# Patient Record
Sex: Male | Born: 1945 | Race: Black or African American | Hispanic: No | Marital: Single | Smoking: Current every day smoker
Health system: Southern US, Community
[De-identification: ages and names within clinical notes are randomized; demographics above are authoritative.]

## PROBLEM LIST (undated history)

## (undated) DIAGNOSIS — R52 Pain, unspecified: Secondary | ICD-10-CM

## (undated) DIAGNOSIS — M1990 Unspecified osteoarthritis, unspecified site: Secondary | ICD-10-CM

## (undated) DIAGNOSIS — R569 Unspecified convulsions: Secondary | ICD-10-CM

## (undated) HISTORY — PX: HAND SURGERY: SHX662

---

## 1998-08-19 DIAGNOSIS — R569 Unspecified convulsions: Secondary | ICD-10-CM

## 1998-08-19 HISTORY — DX: Unspecified convulsions: R56.9

## 2005-03-19 ENCOUNTER — Other Ambulatory Visit: Payer: Self-pay

## 2005-03-19 ENCOUNTER — Emergency Department: Payer: Self-pay | Admitting: Emergency Medicine

## 2008-08-19 HISTORY — PX: JOINT REPLACEMENT: SHX530

## 2014-02-18 ENCOUNTER — Emergency Department (HOSPITAL_COMMUNITY): Payer: Medicare Other

## 2014-02-18 ENCOUNTER — Emergency Department (HOSPITAL_COMMUNITY)
Admission: EM | Admit: 2014-02-18 | Discharge: 2014-02-19 | Disposition: A | Discharge status: Home / Self Care | Payer: Medicare Other | Attending: Emergency Medicine | Admitting: Emergency Medicine

## 2014-02-18 ENCOUNTER — Encounter (HOSPITAL_COMMUNITY): Payer: Self-pay | Admitting: Emergency Medicine

## 2014-02-18 DIAGNOSIS — S0181XA Laceration without foreign body of other part of head, initial encounter: Secondary | ICD-10-CM

## 2014-02-18 DIAGNOSIS — W219XXA Striking against or struck by unspecified sports equipment, initial encounter: Secondary | ICD-10-CM | POA: Insufficient documentation

## 2014-02-18 DIAGNOSIS — Y9289 Other specified places as the place of occurrence of the external cause: Secondary | ICD-10-CM | POA: Insufficient documentation

## 2014-02-18 DIAGNOSIS — S0180XA Unspecified open wound of other part of head, initial encounter: Secondary | ICD-10-CM | POA: Insufficient documentation

## 2014-02-18 DIAGNOSIS — F17200 Nicotine dependence, unspecified, uncomplicated: Secondary | ICD-10-CM | POA: Insufficient documentation

## 2014-02-18 DIAGNOSIS — Y9389 Activity, other specified: Secondary | ICD-10-CM | POA: Insufficient documentation

## 2014-02-18 DIAGNOSIS — F1010 Alcohol abuse, uncomplicated: Secondary | ICD-10-CM

## 2014-02-18 DIAGNOSIS — S0990XA Unspecified injury of head, initial encounter: Secondary | ICD-10-CM | POA: Insufficient documentation

## 2014-02-18 DIAGNOSIS — Z23 Encounter for immunization: Secondary | ICD-10-CM | POA: Insufficient documentation

## 2014-02-18 LAB — COMPREHENSIVE METABOLIC PANEL
ALT: 6 U/L (ref 0–53)
ANION GAP: 17 — AB (ref 5–15)
AST: 16 U/L (ref 0–37)
Albumin: 2.7 g/dL — ABNORMAL LOW (ref 3.5–5.2)
Alkaline Phosphatase: 44 U/L (ref 39–117)
BUN: 8 mg/dL (ref 6–23)
CALCIUM: 7.5 mg/dL — AB (ref 8.4–10.5)
CO2: 18 mEq/L — ABNORMAL LOW (ref 19–32)
CREATININE: 1.04 mg/dL (ref 0.50–1.35)
Chloride: 102 mEq/L (ref 96–112)
GFR calc Af Amer: 82 mL/min — ABNORMAL LOW (ref >90)
GFR calc non Af Amer: 71 mL/min — ABNORMAL LOW (ref >90)
GLUCOSE: 95 mg/dL (ref 70–99)
Potassium: 3.9 mEq/L (ref 3.7–5.3)
Sodium: 137 mEq/L (ref 137–147)
Total Bilirubin: <0.2 mg/dL — ABNORMAL LOW (ref 0.3–1.2)
Total Protein: 5.7 g/dL — ABNORMAL LOW (ref 6.0–8.3)

## 2014-02-18 LAB — I-STAT CHEM 8, ED
BUN: 6 mg/dL (ref 6–23)
CREATININE: 1.5 mg/dL — AB (ref 0.50–1.35)
Calcium, Ion: 1.08 mmol/L — ABNORMAL LOW (ref 1.13–1.30)
Chloride: 103 mEq/L (ref 96–112)
GLUCOSE: 95 mg/dL (ref 70–99)
HCT: 35 % — ABNORMAL LOW (ref 39.0–52.0)
Hemoglobin: 11.9 g/dL — ABNORMAL LOW (ref 13.0–17.0)
POTASSIUM: 3.6 meq/L — AB (ref 3.7–5.3)
Sodium: 140 mEq/L (ref 137–147)
TCO2: 19 mmol/L (ref 0–100)

## 2014-02-18 LAB — I-STAT CG4 LACTIC ACID, ED: Lactic Acid, Venous: 3.39 mmol/L — ABNORMAL HIGH (ref 0.5–2.2)

## 2014-02-18 LAB — URINALYSIS, ROUTINE W REFLEX MICROSCOPIC
BILIRUBIN URINE: NEGATIVE
Glucose, UA: NEGATIVE mg/dL
HGB URINE DIPSTICK: NEGATIVE
Ketones, ur: NEGATIVE mg/dL
Leukocytes, UA: NEGATIVE
Nitrite: NEGATIVE
Protein, ur: NEGATIVE mg/dL
Specific Gravity, Urine: 1.017 (ref 1.005–1.030)
UROBILINOGEN UA: 0.2 mg/dL (ref 0.0–1.0)
pH: 5 (ref 5.0–8.0)

## 2014-02-18 LAB — RAPID URINE DRUG SCREEN, HOSP PERFORMED
AMPHETAMINES: NOT DETECTED
BENZODIAZEPINES: NOT DETECTED
Barbiturates: NOT DETECTED
COCAINE: NOT DETECTED
OPIATES: NOT DETECTED
TETRAHYDROCANNABINOL: NOT DETECTED

## 2014-02-18 LAB — CBC
HCT: 31.5 % — ABNORMAL LOW (ref 39.0–52.0)
HEMOGLOBIN: 10.4 g/dL — AB (ref 13.0–17.0)
MCH: 32.8 pg (ref 26.0–34.0)
MCHC: 33 g/dL (ref 30.0–36.0)
MCV: 99.4 fL (ref 78.0–100.0)
Platelets: 210 10*3/uL (ref 150–400)
RBC: 3.17 MIL/uL — AB (ref 4.22–5.81)
RDW: 16.1 % — ABNORMAL HIGH (ref 11.5–15.5)
WBC: 5.7 10*3/uL (ref 4.0–10.5)

## 2014-02-18 LAB — SAMPLE TO BLOOD BANK

## 2014-02-18 LAB — I-STAT TROPONIN, ED: Troponin i, poc: 0.01 ng/mL (ref 0.00–0.08)

## 2014-02-18 LAB — PROTIME-INR
INR: 1.06 (ref 0.00–1.49)
PROTHROMBIN TIME: 13.8 s (ref 11.6–15.2)

## 2014-02-18 LAB — ETHANOL
ALCOHOL ETHYL (B): 233 mg/dL — AB (ref 0–11)
Alcohol, Ethyl (B): 363 mg/dL — ABNORMAL HIGH (ref 0–11)

## 2014-02-18 MED ORDER — TETANUS-DIPHTH-ACELL PERTUSSIS 5-2.5-18.5 LF-MCG/0.5 IM SUSP
INTRAMUSCULAR | Status: AC
  Administered 2014-02-18: 0.5 mL via INTRAMUSCULAR
  Filled 2014-02-18: qty 0.5

## 2014-02-18 MED ORDER — SODIUM CHLORIDE 0.9 % IV BOLUS (SEPSIS)
1000.0000 mL | Freq: Once | INTRAVENOUS | Status: AC
  Administered 2014-02-18: 1000 mL via INTRAVENOUS

## 2014-02-18 NOTE — ED Notes (Signed)
Pt is appropriate for Pod C per MD Preston FleetingGlick.
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
## 2014-02-18 NOTE — ED Notes (Signed)
i-stat CG4 result given to Dr. Preston FleetingGlick
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
## 2014-02-18 NOTE — ED Notes (Signed)
Pt returned from scans. Pt continues to deny any pain. Pt did report mid back pain during xrays. Pt remains alert, responds appropriately to questions. Follows commands.
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
## 2014-02-19 ENCOUNTER — Encounter (HOSPITAL_COMMUNITY): Payer: Self-pay | Admitting: Emergency Medicine

## 2014-02-19 DIAGNOSIS — F4325 Adjustment disorder with mixed disturbance of emotions and conduct: Secondary | ICD-10-CM

## 2014-02-19 DIAGNOSIS — R45850 Homicidal ideations: Secondary | ICD-10-CM

## 2014-02-19 MED ORDER — ACETAMINOPHEN 325 MG PO TABS: 650.0000 mg | ORAL_TABLET | ORAL | Status: DC | PRN

## 2014-02-19 MED ORDER — ZOLPIDEM TARTRATE 5 MG PO TABS: 5.0000 mg | ORAL_TABLET | Freq: Every evening | ORAL | Status: DC | PRN

## 2014-02-19 MED ORDER — NICOTINE 21 MG/24HR TD PT24: 21.0000 mg | MEDICATED_PATCH | Freq: Every day | TRANSDERMAL | Status: DC

## 2014-02-19 MED ORDER — ALUM & MAG HYDROXIDE-SIMETH 200-200-20 MG/5ML PO SUSP: 30.0000 mL | ORAL | Status: DC | PRN

## 2014-02-19 MED ORDER — ONDANSETRON HCL 4 MG PO TABS
4.0000 mg | ORAL_TABLET | Freq: Three times a day (TID) | ORAL | Status: DC | PRN
Start: 2014-02-19 — End: 2014-02-19

## 2014-02-19 MED ORDER — LORAZEPAM 1 MG PO TABS: 1.0000 mg | ORAL_TABLET | Freq: Three times a day (TID) | ORAL | Status: DC | PRN

## 2014-02-19 NOTE — ED Notes (Signed)
MD at bedside. Dr. Patria Maneampos expaining to patient the reason he could not be discharge

## 2014-02-19 NOTE — BH Assessment (Signed)
Per Binnie RailJoann Robinson, [facility], adult unit is at capacity. Javier SamFran Hobson, NP recommends Pt be referred to geriatric-psychiatry unit. Contacted the following facilities for placement:  BED AVAILABLE, FAXED CLINICAL INFORMATION: [facility], [facility], per [facility], per Aurea GraffJoan  AT CAPAC[facility]: [facility], [facility], per [facility], per [facility]   Javier Robinson, [facility], [facility] Triage Specialist [phone]

## 2014-02-19 NOTE — ED Notes (Signed)
Called [facility] to check on delay in getting pt dispo/note by the np that evaluated pt. Pt states he is anxious to go home. Message has been left on np phone and have spoken to ac. Waiting on dispo.

## 2014-02-19 NOTE — BH Assessment (Signed)
[facility] stating [facility].  Harlin RainFord Ellis Ria CommentWarrick Jr, LPC, [facility] Triage Specialist [phone]
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
## 2014-02-19 NOTE — ED Notes (Signed)
telepsych is in progress.

## 2014-02-19 NOTE — BH Assessment (Signed)
Assessment completed. Consulted with Alberteen SamFran Hobson, NP who recommended inpatient treatment. TTS will contact other facilities for placement. Dr. Virgel BouquetShepard has been notified of the recommendations.

## 2014-02-19 NOTE — ED Notes (Signed)
[facility] HAS BEEN CALLED TO TRANSPORT PT HOME
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
## 2014-02-19 NOTE — ED Notes (Signed)
MD at bedside. 
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
## 2014-02-19 NOTE — Discharge Instructions (Signed)
Present to ED or PCP for suture removal in 5-7 days (02/25/14) Alcohol Intoxication Alcohol intoxication occurs when you drink enough alcohol that it affects your ability to function. It can be mild or very severe. Drinking a lot of alcohol in a short time is called binge drinking. This can be very harmful. Drinking alcohol can also be more dangerous if you are taking medicines or other drugs. Some of the effects caused by alcohol may include:  Loss of coordination.  Changes in mood and behavior.  Unclear thinking.  Trouble talking (slurred speech).  Throwing up (vomiting).  Confusion.  Slowed breathing.  Twitching and shaking (seizures).  Loss of consciousness. HOME CARE  Do not drive after drinking alcohol.  Drink enough water and fluids to keep your pee (urine) clear or pale yellow. Avoid caffeine.  Only take medicine as told by your doctor. GET HELP IF:  You throw up (vomit) many times.  You do not feel better after a few days.  You frequently have alcohol intoxication. Your doctor can help decide if you should see a substance use treatment counselor. GET HELP RIGHT AWAY IF:  You become shaky when you stop drinking.  You have twitching and shaking.  You throw up blood. It may look bright red or like coffee grounds.  You notice blood in your poop (bowel movements).  You become lightheaded or pass out (faint). MAKE SURE YOU:   Understand these instructions.  Will watch your condition.  Will get help right away if you are not doing well or get worse. Document Released: 01/22/2008 Document Revised: 04/07/2013 Document Reviewed: 01/08/2013 [facility] Patient Information 2015 [place], [place]. This information is not intended to replace advice given to you by your health care provider. Make sure you discuss any questions you have with your health care provider.  Alcohol Problems Most adults who drink alcohol drink in moderation (not a lot) are at low risk for  developing problems related to their drinking. However, all drinkers, including low-risk drinkers, should know about the health risks connected with drinking alcohol. RECOMMENDATIONS FOR LOW-RISK DRINKING  Drink in moderation. Moderate drinking is defined as follows:   Men - no more than 2 drinks per day.  Nonpregnant women - no more than 1 drink per day.  Over age 59 - no more than 1 drink per day. A standard drink is 12 grams of pure alcohol, which is equal to a 12 ounce bottle of beer or wine cooler, a 5 ounce glass of wine, or 1.5 ounces of distilled spirits (such as whiskey, brandy, vodka, or rum).  ABSTAIN FROM (DO NOT DRINK) ALCOHOL:  When pregnant or considering pregnancy.  When taking a medication that interacts with alcohol.  If you are alcohol dependent.  A medical condition that prohibits drinking alcohol (such as ulcer, liver disease, or heart disease). DISCUSS WITH YOUR CAREGIVER:  If you are at risk for coronary heart disease, discuss the potential benefits and risks of alcohol use: Light to moderate drinking is associated with lower rates of coronary heart disease in certain populations (for example, men over age 73 and postmenopausal women). Infrequent or nondrinkers are advised not to begin light to moderate drinking to reduce the risk of coronary heart disease so as to avoid creating an alcohol-related problem. Similar protective effects can likely be gained through proper diet and exercise.  Women and the elderly have smaller amounts of body water than men. As a result women and the elderly achieve a higher blood alcohol concentration  after drinking the same amount of alcohol.  Exposing a fetus to alcohol can cause a broad range of birth defects referred to as Fetal Alcohol Syndrome (FAS) or Alcohol-Related Birth Defects (ARBD). Although FAS/ARBD is connected with excessive alcohol consumption during pregnancy, studies also have reported neurobehavioral problems in  infants born to mothers reporting drinking an average of 1 drink per day during pregnancy.  Heavier drinking (the consumption of more than 4 drinks per occasion by men and more than 3 drinks per occasion by women) impairs learning (cognitive) and psychomotor functions and increases the risk of alcohol-related problems, including accidents and injuries. CAGE QUESTIONS:   Have you ever felt that you should Cut down on your drinking?  Have people Annoyed you by criticizing your drinking?  Have you ever felt bad or Guilty about your drinking?  Have you ever had a drink first thing in the morning to steady your nerves or get rid of a hangover (Eye opener)? If you answered positively to any of these questions: You may be at risk for alcohol-related problems if alcohol consumption is:   Men: Greater than 14 drinks per week or more than 4 drinks per occasion.  Women: Greater than 7 drinks per week or more than 3 drinks per occasion. Do you or your family have a medical history of alcohol-related problems, such as:  Blackouts.  Sexual dysfunction.  Depression.  Trauma.  Liver dysfunction.  Sleep disorders.  Hypertension.  Chronic abdominal pain.  Has your drinking ever caused you problems, such as problems with your family, problems with your work (or school) performance, or accidents/injuries?  Do you have a compulsion to drink or a preoccupation with drinking?  Do you have poor control or are you unable to stop drinking once you have started?  Do you have to drink to avoid withdrawal symptoms?  Do you have problems with withdrawal such as tremors, nausea, sweats, or mood disturbances?  Does it take more alcohol than in the past to get you high?  Do you feel a strong urge to drink?  Do you change your plans so that you can have a drink?  Do you ever drink in the morning to relieve the shakes or a hangover? If you have answered a number of the previous questions positively,  it may be time for you to talk to your caregivers, family, and friends and see if they think you have a problem. Alcoholism is a chemical dependency that keeps getting worse and will eventually destroy your health and relationships. Many alcoholics end up dead, impoverished, or in prison. This is often the end result of all chemical dependency.  Do not be discouraged if you are not ready to take action immediately.  Decisions to change behavior often involve up and down desires to change and feeling like you cannot decide.  Try to think more seriously about your drinking behavior.  Think of the reasons to quit. WHERE TO GO FOR ADDITIONAL INFORMATION   The National Institute on Alcohol Abuse and Alcoholism (NIAAA) BasicStudents.dk  [employer] on Alcoholism and Drug Dependence (NCADD) www.ncadd.org  American Society of Addiction Medicine (ASAM) RoyalDiary.gl  Document Released: 08/05/2005 Document Revised: 10/28/2011 Document Reviewed: 03/23/2008 [facility] Patient Information 2015 [place], [place]. This information is not intended to replace advice given to you by your health care provider. Make sure you discuss any questions you have with your health care provider.  Alcohol Use Disorder Alcohol use disorder is a mental disorder. It is not a one-time  incident of heavy drinking. Alcohol use disorder is the excessive and uncontrollable use of alcohol over time that leads to problems with functioning in one or more areas of daily living. People with this disorder risk harming themselves and others when they drink to excess. Alcohol use disorder also can cause other mental disorders, such as mood and anxiety disorders, and serious physical problems. People with alcohol use disorder often misuse other drugs.  Alcohol use disorder is common and widespread. Some people with this disorder drink alcohol to cope with or escape from negative life events. Others drink to relieve chronic pain or  symptoms of mental illness. People with a family history of alcohol use disorder are at higher risk of losing control and using alcohol to excess.  SYMPTOMS  Signs and symptoms of alcohol use disorder may include the following:   Consumption ofalcohol inlarger amounts or over a longer period of time than intended.  Multiple unsuccessful attempts to cutdown or control alcohol use.   A great deal of time spent obtaining alcohol, using alcohol, or recovering from the effects of alcohol (hangover).  A strong desire or urge to use alcohol (cravings).   Continued use of alcohol despite problems at work, school, or home because of alcohol use.   Continued use of alcohol despite problems in relationships because of alcohol use.  Continued use of alcohol in situations when it is physically hazardous, such as driving a car.  Continued use of alcohol despite awareness of a physical or psychological problem that is likely related to alcohol use. Physical problems related to alcohol use can involve the brain, heart, liver, stomach, and intestines. Psychological problems related to alcohol use include intoxication, depression, anxiety, psychosis, delirium, and dementia.   The need for increased amounts of alcohol to achieve the same desired effect, or a decreased effect from the consumption of the same amount of alcohol (tolerance).  Withdrawal symptoms upon reducing or stopping alcohol use, or alcohol use to reduce or avoid withdrawal symptoms. Withdrawal symptoms include:  Racing heart.  Hand tremor.  Difficulty sleeping.  Nausea.  Vomiting.  Hallucinations.  Restlessness.  Seizures. DIAGNOSIS Alcohol use disorder is diagnosed through an assessment by your caregiver. Your caregiver may start by asking three or four questions to screen for excessive or problematic alcohol use. To confirm a diagnosis of alcohol use disorder, at least two symptoms (see SYMPTOMS) must be present within  a 32-month period. The severity of alcohol use disorder depends on the number of symptoms:  Mild--two or three.  Moderate--four or five.  Severe--six or more. Your caregiver may perform a physical exam or use results from lab tests to see if you have physical problems resulting from alcohol use. Your caregiver may refer you to a mental health professional for evaluation. TREATMENT  Some people with alcohol use disorder are able to reduce their alcohol use to low-risk levels. Some people with alcohol use disorder need to quit drinking alcohol. When necessary, mental health professionals with specialized training in substance use treatment can help. Your caregiver can help you decide how severe your alcohol use disorder is and what type of treatment you need. The following forms of treatment are available:   Detoxification. Detoxification involves the use of prescription medication to prevent alcohol withdrawal symptoms in the first week after quitting. This is important for people with a history of symptoms of withdrawal and for heavy drinkers who are likely to have withdrawal symptoms. Alcohol withdrawal can be dangerous and, in severe cases,  cause death. Detoxification is usually provided in a hospital or in-patient substance use treatment facility.  Counseling or talk therapy. Talk therapy is provided by substance use treatment counselors. It addresses the reasons people use alcohol and ways to keep them from drinking again. The goals of talk therapy are to help people with alcohol use disorder find healthy activities and ways to cope with life stress, to identify and avoid triggers for alcohol use, and to handle cravings, which can cause relapse.  Medication.Different medications can help treat alcohol use disorder through the following actions:  Decrease alcohol cravings.  Decrease the positive reward response felt from alcohol use.  Produce an uncomfortable physical reaction when alcohol  is used (aversion therapy).  Support groups. Support groups are run by people who have quit drinking. They provide emotional support, advice, and guidance. These forms of treatment are often combined. Some people with alcohol use disorder benefit from intensive combination treatment provided by specialized substance use treatment centers. Both inpatient and outpatient treatment programs are available. Document Released: 09/12/2004 Document Revised: 04/07/2013 Document Reviewed: 11/12/2012 [facility] Patient Information 2015 [place], [place]. This information is not intended to replace advice given to you by your health care provider. Make sure you discuss any questions you have with your health care provider. You have had a head injury which does not appear to require admission at this time. A concussion is a state of changed mental ability from trauma. SEEK IMMEDIATE MEDICAL ATTENTION IF: There is confusion or drowsiness (although children frequently become drowsy after injury).  You cannot awaken the injured person.  There is nausea (feeling sick to your stomach) or continued, forceful vomiting.  You notice dizziness or unsteadiness which is getting worse, or inability to walk.  You have convulsions or unconsciousness.  You experience severe, persistent headaches not relieved by Tylenol?. (Do not take aspirin as this impairs clotting abilities). Take other pain medications only as directed.  You cannot use arms or legs normally.  There are changes in pupil sizes. (This is the black center in the colored part of the eye)  There is clear or bloody discharge from the nose or ears.  Change in speech, vision, swallowing, or understanding.  Localized weakness, numbness, tingling, or change in bowel or bladder control.  A laceration is a cut or lesion that goes through all layers of the skin and into the tissue just beneath the skin. This may have been repaired by your caregiver.  SEEK MEDICAL ATTENTION  IF: There is redness, swelling, increasing pain in the wound  There is a red line that goes up your arm or leg.  Pus is coming from wound.  You develop an unexplained temperature above 100.4.  You notice a foul smell coming from the wound or dressing.  There is a breaking open of the wound (edges not staying together) after sutures have been removed. If you did not receive a tetanus shot today because you thought you were up to date, but did not recall when your last one was given, make sure to check with your primary caregiver to determine if you need one.       Behavioral Health Resources in the [facility]  Intensive Outpatient Programs: [facility]      [address] [place], [place] [phone] Both a day and evening program       [facility] Outpatient     [address]  [place], [place] [address] [phone]         ADS: Alcohol [facility] Drug [facility] [address] [place] [phone]  [facility] ACCESS LINE: [phone] or [phone] [address] [place], [place] [address] [email or web address]  Mobile Crisis Teams:                                        [facility]rapeutic [facility]atives         Mobile Crisis Care Unit [phone]             [facility] [address] [place] [phone]                                         Interventionist [address]on DeEsch [address] [place] [phone]  Self-Help/Support Groups: Mental Health Assoc. of [place] Variety of support groups [phone] (call for more info)  Narcotics Anonymous (NA) Caring Services [address] [place] - 2 meetings at this location  Residential Treatment Programs:  [facility]      [address]        [place]       [phone]         [facility] [address], [place] [address] [place], [place]   [phone]  [facility]  [address] [place], [place] [address] [phone] Admissions: 8am-3pm M-F  [facility]     [address]        [place], [place] [address]       [phone]         [facility] [address] [place], [place] [phone]  [facility] [address] [place], [place] [phone]  [facility] - Intensive Outpatient      [address]     [place], [place]       [phone]         [facility]     [address] [place], [place] [phone] or [phone]  [facility] Services (RTS)  [address] [place], [place] [phone]  [facility] [address]                                               [address] [place] [phone]  [facility] Resources: [place] [facility]- [phone]               General Therapy                                                Angie FavaJulie Brannon, PhD        [address] [place]                                       [place], [place] [address]         [phone]   Insurance  [facility]   [address] [place], [place] [address] [phone]  [facility] [address] [place], [place] [address] [phone] Insurance/Medicaid/sponsorship through [facility]                                              [address]                                        [place], [place] [address]    Therapy/tele-psych/case         [phone]          [facility] [address], [place]  [address]  Adolescent/group home/case management [phone]                                           Creola Corn PhD       General therapy       Insurance   [phone]         Dr. Lolly Mustache Insurance [phone] M-F  [place] [facility]/Residential Medicaid, sponsorship [phone]   Emergency Department  Resource Guide 1) Find a Doctor and Pay Out of Pocket Although you won't have to find out who is covered by your insurance plan, it is a good idea to ask around and get recommendations. You will then need to call the office and see if the doctor you have chosen will accept you as a new patient and what types of options they offer for patients who are self-pay. Some doctors offer discounts or will set up payment plans for their patients who do not have insurance, but you will need to ask so you aren't surprised when you get to your appointment.  2) Contact Your Local Health Department Not all health departments have doctors that can see patients for sick visits, but many do, so it is worth a call to see if yours does. If you don't know where your local health department is, you can check in your phone book. The CDC also has a tool to help you locate your state's health department, and many state websites also have listings of all of their local health departments.  3) Find a Walk-in Clinic If your illness is not likely to be very severe or complicated, you may want to try a walk in clinic. These are popping up all over the country in pharmacies, drugstores, and shopping centers. They're usually staffed by nurse practitioners or physician assistants that have been trained to treat common illnesses and complaints. They're usually fairly quick and inexpensive. However, if you have serious medical issues or chronic medical problems, these are probably not your best option.  No Primary Care Doctor: - Call Health Connect at  [phone] - they can help you locate a primary care doctor that  accepts your insurance, provides certain services, etc. - Physician Referral Service- [phone]  Chronic Pain Problems: Organization         Address  Phone   Notes  [facility]  [phone] Patients need to be referred by their primary care doctor.   Medication Assistance: Organization  Address  Phone   Notes  [facility] Medication [facility] [address] [place], [place] [address] [phone]) [phone] --Must be a resident of [facility] [place] -- Must have NO insurance coverage whatsoever (no Medicaid/ Medicare, etc.) -- The pt. MUST have a primary care doctor that directs their care regularly and follows them in the community   MedAssist  [phone]   [employer]  [phone]    Agencies that provide inexpensive medical care: Organization         Address  Phone   Notes  [facility]  [phone]   [facility]    [phone]   [facility] [address] [place], [place] [address] [phone]   [facility] [address], [place] [phone]   [facility]    [phone]   [facility]    [phone]   [facility]  [address], [place] Phone:  [phone], Fax:  [phone] Hours of Operation:  9 am - 6 pm, M-F.  Also accepts Medicaid/Medicare and self-pay.  [facility]  [address], [place] Phone: [phone]) [phone], Fax: [phone]. Hours of Operation:  8:30 am - 5:30 pm, M-F.  Also accepts Medicaid and self-pay.  [facility] [place] [address], [place] Phone: [phone]   [facility] [address], [place], [place] [phone], [phone]. [phone] Mondays & Thursdays: 7-9 AM.  First 15 patients are seen on a first come, first serve basis.    Medicaid-accepting [facility] Providers:  Organization         Address  Phone   Notes  [facility] [address], [place] [phone] Also accepts self-pay patients.  [facility] [address], [place]  [phone]   [facility] [address], [place] [phone]   [facility] [address] [place] [address], [place] [phone]   Renaye RakersVeita Bland [address], [place]   [phone] Only accepts [place] Access [place] patients after they have their name applied to their card.   Self-Pay (no insurance) in [facility]:  Organization         Address  Phone   Notes  Sickle Cell Patients, [facility] Medicine [address], [place] [phone]   [facility] [address], [place] [phone]   [facility]  [address], [place] [phone]   [facility]/Dr. Osei-Bonsu  [address] [place] [address], [place] or [phone], [place] [phone] Phone number for both [place] and [place] locations is the same.  [facility] [address], [place] [phone]   [facility] [address] [place] [address], [place] or [address] [phone]   [facility] [address], [place] [phone], phone; [phone], fax Sees patients 1st and 3rd Saturday of every month.  Must not qualify for public or private insurance (i.e. Medicaid, Medicare, [place] Health Choice, Veterans' Benefits)  Household income should be no more than 200% of the poverty level The clinic cannot treat you if you are pregnant or think you are  pregnant  Sexually transmitted diseases are not treated at the clinic.   Dental Care: Organization         Address  Phone  Notes  [facility] [address] [place], [place] [phone] Accepts children up to age 24 who are enrolled in [place] or [place] Health Choice; pregnant women with a Medicaid card; and children who have applied for Medicaid or [place] Health Choice, but were declined, whose parents can pay a reduced fee at time of service.  [facility] Department of [facility]  [address], [place] [phone] Accepts children up to age 49 who are enrolled in [place] or [place] Health Choice; pregnant women with a Medicaid card; and children who have applied for Medicaid or [place] Health Choice, but were declined, whose parents can pay a reduced fee at time of service.  [facility]  [address] [place], [place] [phone] Patients are seen by appointment only. Walk-ins are not accepted. [facility] will see patients 27 years of age and older. Monday - Tuesday (8am-5pm) Most Wednesdays (8:30-5pm) $30 per visit, cash only  [facility]  [address], [facility] [phone] Patients are seen by appointment only. Walk-ins are not accepted. [facility] will see patients 35 years of age and older. One Wednesday Evening (Monthly: Volunteer Based).  $30 per visit, cash only  [employer]  [phone] for adults; Children under age 30, call Graduate Pediatric Dentistry at [phone]. Children aged 74-14, please call [phone] to request a pediatric application.  Dental services are provided in all areas of dental care including fillings, crowns and bridges, complete and partial dentures, implants, gum treatment, root canals, and extractions. Preventive care is also provided. Treatment is provided to both adults and children. Patients are selected via a lottery and there is often a waiting list.   [facility] [address], [place]  [phone] [email or web address]   [facility] [address] [place], [place] [phone], Ext. [phone] Second and Fourth Thursday of each month, opens at 6:30 AM; Clinic ends at 9 AM.  Patients are seen on a first-come first-served basis, and a limited number are seen during each clinic.   [facility]  [address] Ether Griffins [place], [place] [phone]   Eligibility Requirements You must  have lived in [place], [place], or [place] counties for at least the last three months.   You cannot be eligible for state or federal sponsored [place], including [employer], [place], or [employer].   You generally cannot be eligible for healthcare insurance through your employer.    How to apply: Eligibility screenings are held every Tuesday and Wednesday afternoon from 1:00 pm until 4:00 pm. You do not need an appointment for the interview!  [facility] [address], [place], [place] [phone]   [facility] Health Department  [phone]   [facility] Health Department  [phone]   [facility] Health Department  [phone]    Behavioral Health Resources in the Community: Intensive Outpatient Programs Organization         Address  Phone  Notes  [facility] [address], [place], [place] [phone]   [facility] [address], [place], [place] [phone]   [facility] [address], [place]inette Otto,  [place]  [phone]   [facility] Mental Health [address],  [place], [place] [phone] or [phone]   Substance Abuse Resources Organization         Address  Phone  Notes  [facility]  [phone]   [facility]  [phone]   [facility]  [phone]   Floydene Flock  [phone]   Residential & Outpatient Substance Abuse Program  [phone]   Psychological Services Organization         Address  Phone  Notes  [facility]  [phone]   [facility] Services  [phone]   [facility] [address] [place] [phone] or [phone]    Mobile Crisis Teams Organization         Address  Phone  Notes  [facility], Mobile Crisis Care Unit  [phone]   [facility]  [address] [place], [place] [phone]   Doristine Locks [address] [place] [phone]    Self-Help/Support Groups Organization         Address  Phone             Notes  [facility] [place] - variety of support groups  [phone]- [phone] Call for more information  Narcotics Anonymous (NA), [facility] [address], [employer] [place]  2 meetings at this location   Statistician         Address  Phone  Notes  [facility] [address],    [place]  [phone]   [facility]  [address] [place] [address], [place], [place] [phone]   [facility] [address] [place], [place] [phone] Admissions: 8am-3pm M-F  [facility] [address],    [place], [place] [phone]   [facility] [address] [place], [place], [place] [phone]   [facility] [address],  [place], [place] [phone]   [facility] - Intensive Outpatient [address], [place], [place] [phone]   [facility] ([facility] [address] [place]  [place], [place] [phone] or [phone]   [facility] ([facility]) [address] [place], [place] [phone] Accepts Medicaid  [facility] [address]  [place] [phone] Substance Abuse/Addiction Treatment   [facility] Organization         Address  Phone  Notes  [facility]  [phone]   Angie Fava, PhD [address] Ervin Knack [place], [place]   [phone] or [phone]   [facility] Behavioral   [address] [place], [place] [phone]   [facility] [address], [place], [place] [phone] Insurance/Medicaid/sponsorship through [facility] and Families [address]                                    [place], [place] [phone] Therapy/tele-psych/case  [facility] [address], [place] [phone]    Dr. Lolly Mustache  [phone]   [facility] [place]  [facility] Dept. 1) [address] [place] 2) [address] [place] 3)  [address], [place] [phone]  [phone]   [facility] Child Abuse Hotline (941) 550-9611  or (336) [phone] (After Hours)         Be sure to call your caregiver and arrange for follow-up care as suggested by our staff. RETURN IMMEDIATELY IF DEVELOP threat to harm self or others, suicidal or homicidal thoughts, hallucinations or confusion, unable to be cared for at home or uncontrolled behavior, or other concerns.

## 2014-02-19 NOTE — ED Provider Notes (Signed)
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  Hurman HornJohn M Bryan Goin, MD 02/19/14 [phone]

## 2016-02-05 ENCOUNTER — Ambulatory Visit
Admission: RE | Admit: 2016-02-05 | Discharge: 2016-02-05 | Disposition: A | Discharge status: Home / Self Care | Payer: Medicare Other | Source: Ambulatory Visit | Attending: Internal Medicine | Admitting: Internal Medicine

## 2016-02-05 ENCOUNTER — Other Ambulatory Visit: Payer: Self-pay | Admitting: Internal Medicine

## 2016-02-05 DIAGNOSIS — M5137 Other intervertebral disc degeneration, lumbosacral region: Secondary | ICD-10-CM | POA: Insufficient documentation

## 2016-02-05 DIAGNOSIS — M5136 Other intervertebral disc degeneration, lumbar region: Secondary | ICD-10-CM | POA: Diagnosis not present

## 2016-02-05 DIAGNOSIS — M545 Low back pain: Secondary | ICD-10-CM

## 2018-04-02 ENCOUNTER — Encounter: Payer: Self-pay | Admitting: *Deleted

## 2018-04-09 ENCOUNTER — Ambulatory Visit: Payer: Medicare Other | Admitting: Certified Registered Nurse Anesthetist

## 2018-04-09 ENCOUNTER — Encounter
Admission: RE | Disposition: A | Discharge status: Home / Self Care | Payer: Self-pay | Source: Ambulatory Visit | Attending: Ophthalmology

## 2018-04-09 ENCOUNTER — Ambulatory Visit
Admission: RE | Admit: 2018-04-09 | Discharge: 2018-04-09 | Disposition: A | Discharge status: Home / Self Care | Payer: Medicare Other | Source: Ambulatory Visit | Attending: Ophthalmology | Admitting: Ophthalmology

## 2018-04-09 DIAGNOSIS — Z96651 Presence of right artificial knee joint: Secondary | ICD-10-CM | POA: Insufficient documentation

## 2018-04-09 DIAGNOSIS — Z79899 Other long term (current) drug therapy: Secondary | ICD-10-CM | POA: Diagnosis not present

## 2018-04-09 DIAGNOSIS — H278 Other specified disorders of lens: Secondary | ICD-10-CM | POA: Diagnosis not present

## 2018-04-09 DIAGNOSIS — H5703 Miosis: Secondary | ICD-10-CM | POA: Diagnosis not present

## 2018-04-09 DIAGNOSIS — H2512 Age-related nuclear cataract, left eye: Secondary | ICD-10-CM | POA: Insufficient documentation

## 2018-04-09 HISTORY — DX: Pain, unspecified: R52

## 2018-04-09 HISTORY — DX: Unspecified convulsions: R56.9

## 2018-04-09 HISTORY — DX: Unspecified osteoarthritis, unspecified site: M19.90

## 2018-04-09 HISTORY — PX: CATARACT EXTRACTION W/PHACO: SHX586

## 2018-04-09 SURGERY — PHACOEMULSIFICATION, CATARACT, WITH IOL INSERTION
Anesthesia: Monitor Anesthesia Care | Site: Eye | Laterality: Left | Wound class: "Clean "

## 2018-04-09 MED ORDER — FENTANYL CITRATE (PF) 100 MCG/2ML IJ SOLN
INTRAMUSCULAR | Status: DC | PRN
  Administered 2018-04-09 (×3): 25 ug via INTRAVENOUS

## 2018-04-09 MED ORDER — NA HYALUR & NA CHOND-NA HYALUR 0.55-0.5 ML IO KIT
PACK | INTRAOCULAR | Status: AC
  Filled 2018-04-09: qty 1.05

## 2018-04-09 MED ORDER — EPINEPHRINE PF 1 MG/ML IJ SOLN
INTRAOCULAR | Status: DC | PRN
  Administered 2018-04-09: 60 mL via OPHTHALMIC

## 2018-04-09 MED ORDER — NA HYALUR & NA CHOND-NA HYALUR 0.4-0.35 ML IO KIT
PACK | INTRAOCULAR | Status: DC | PRN
  Administered 2018-04-09: 1 mL via INTRAOCULAR

## 2018-04-09 MED ORDER — MIDAZOLAM HCL 2 MG/2ML IJ SOLN
INTRAMUSCULAR | Status: AC
  Filled 2018-04-09: qty 2

## 2018-04-09 MED ORDER — LIDOCAINE HCL (PF) 4 % IJ SOLN
INTRAOCULAR | Status: DC | PRN
  Administered 2018-04-09: 1 mL

## 2018-04-09 MED ORDER — NEOMYCIN-POLYMYXIN-DEXAMETH 0.1 % OP OINT
TOPICAL_OINTMENT | OPHTHALMIC | Status: DC | PRN
  Administered 2018-04-09: 1 via OPHTHALMIC

## 2018-04-09 MED ORDER — POVIDONE-IODINE 5 % OP SOLN
OPHTHALMIC | Status: AC
  Filled 2018-04-09: qty 30

## 2018-04-09 MED ORDER — SODIUM CHLORIDE 0.9 % IV SOLN
INTRAVENOUS | Status: DC | PRN
  Administered 2018-04-09: 10:00:00 via INTRAVENOUS

## 2018-04-09 MED ORDER — MOXIFLOXACIN HCL 0.5 % OP SOLN
OPHTHALMIC | Status: DC | PRN
  Administered 2018-04-09: 0.5 mL via OPHTHALMIC

## 2018-04-09 MED ORDER — MOXIFLOXACIN HCL 0.5 % OP SOLN
OPHTHALMIC | Status: AC
  Administered 2018-04-09: 1 [drp] via OPHTHALMIC
  Filled 2018-04-09: qty 6

## 2018-04-09 MED ORDER — MIDAZOLAM HCL 2 MG/2ML IJ SOLN
INTRAMUSCULAR | Status: DC | PRN
  Administered 2018-04-09 (×2): 0.5 mg via INTRAVENOUS

## 2018-04-09 MED ORDER — FENTANYL CITRATE (PF) 100 MCG/2ML IJ SOLN
INTRAMUSCULAR | Status: AC
  Filled 2018-04-09: qty 2

## 2018-04-09 MED ORDER — MOXIFLOXACIN HCL 0.5 % OP SOLN
1.0000 [drp] | OPHTHALMIC | Status: AC
  Administered 2018-04-09 (×3): 1 [drp] via OPHTHALMIC

## 2018-04-09 MED ORDER — NEOMYCIN-POLYMYXIN-DEXAMETH 3.5-10000-0.1 OP OINT
TOPICAL_OINTMENT | OPHTHALMIC | Status: AC
  Filled 2018-04-09: qty 3.5

## 2018-04-09 MED ORDER — ONDANSETRON HCL 4 MG/2ML IJ SOLN
INTRAMUSCULAR | Status: DC | PRN
  Administered 2018-04-09: 4 mg via INTRAVENOUS

## 2018-04-09 MED ORDER — POVIDONE-IODINE 5 % OP SOLN
OPHTHALMIC | Status: DC | PRN
  Administered 2018-04-09: 1 via OPHTHALMIC

## 2018-04-09 MED ORDER — ARMC OPHTHALMIC DILATING DROPS
1.0000 "application " | OPHTHALMIC | Status: AC
  Administered 2018-04-09 (×3): 1 via OPHTHALMIC

## 2018-04-09 MED ORDER — LIDOCAINE HCL (PF) 4 % IJ SOLN
INTRAMUSCULAR | Status: AC
  Filled 2018-04-09: qty 5

## 2018-04-09 MED ORDER — CARBACHOL 0.01 % IO SOLN
INTRAOCULAR | Status: DC | PRN
  Administered 2018-04-09: 0.5 mL via INTRAOCULAR

## 2018-04-09 MED ORDER — ARMC OPHTHALMIC DILATING DROPS
OPHTHALMIC | Status: AC
  Administered 2018-04-09: 1 via OPHTHALMIC
  Filled 2018-04-09: qty 0.5

## 2018-04-09 MED ORDER — EPINEPHRINE PF 1 MG/ML IJ SOLN
INTRAMUSCULAR | Status: AC
  Filled 2018-04-09: qty 2

## 2018-04-09 SURGICAL SUPPLY — 20 items
GLOVE BIO SURGEON STRL SZ8 (GLOVE) ×2 IMPLANT
GLOVE BIOGEL M 6.5 STRL (GLOVE) ×2 IMPLANT
GLOVE SURG LX 7.5 STRW (GLOVE) ×1
GLOVE SURG LX STRL 7.5 STRW (GLOVE) ×1 IMPLANT
GOWN STRL REUS W/ TWL LRG LVL3 (GOWN DISPOSABLE) ×2 IMPLANT
GOWN STRL REUS W/TWL LRG LVL3 (GOWN DISPOSABLE) ×2
LABEL CATARACT MEDS ST (LABEL) ×2 IMPLANT
LENS IOL TECNIS ITEC 19.5 (Intraocular Lens) ×1 IMPLANT
NDL HPO THNWL 1X22GA REG BVL (NEEDLE) ×1 IMPLANT
NEEDLE SAFETY 22GX1 (NEEDLE) ×1
PACK CATARACT (MISCELLANEOUS) ×2 IMPLANT
PACK CATARACT BRASINGTON LX (MISCELLANEOUS) ×2 IMPLANT
PACK EYE AFTER SURG (MISCELLANEOUS) ×2 IMPLANT
RING CAPSULAR TYPE 14A UNLD (Ring) ×1 IMPLANT
RING MALYGIN 7.0 (MISCELLANEOUS) ×1 IMPLANT
SOL BSS BAG (MISCELLANEOUS) ×2
SOLUTION BSS BAG (MISCELLANEOUS) ×1 IMPLANT
SYR 5ML LL (SYRINGE) ×2 IMPLANT
WATER STERILE IRR 250ML POUR (IV SOLUTION) ×2 IMPLANT
WIPE NON LINTING 3.25X3.25 (MISCELLANEOUS) ×2 IMPLANT

## 2018-04-09 NOTE — Transfer of Care (Signed)
Immediate Anesthesia Transfer of Care Note  Patient: Javier Robinson  Procedure(s) Performed: CATARACT EXTRACTION PHACO AND INTRAOCULAR LENS PLACEMENT (IOC) (Left Eye)  Patient Location: PACU  Anesthesia Type:MAC  Level of Consciousness: awake, alert  and oriented  Airway & Oxygen Therapy: Patient Spontanous Breathing  Post-op Assessment: Report given to RN and Post -op Vital signs reviewed and stable  Post vital signs: stable  Last Vitals:  Vitals Value Taken Time  BP    Temp    Pulse    Resp    SpO2      Last Pain:  Vitals:   04/09/18 0830  TempSrc: Temporal  PainSc:          Complications: No apparent anesthesia complications

## 2018-04-09 NOTE — Anesthesia Postprocedure Evaluation (Signed)
Anesthesia Post Note  Patient: KALE RONDEAU  Procedure(s) Performed: CATARACT EXTRACTION PHACO AND INTRAOCULAR LENS PLACEMENT (IOC) (Left Eye)  Patient location during evaluation: PACU Anesthesia Type: MAC Level of consciousness: awake and alert and oriented Pain management: pain level controlled Vital Signs Assessment: post-procedure vital signs reviewed and stable Respiratory status: spontaneous breathing, nonlabored ventilation and respiratory function stable Cardiovascular status: blood pressure returned to baseline and stable Postop Assessment: no headache Anesthetic complications: no     Last Vitals:  Vitals:   04/09/18 0830 04/09/18 1006  BP: (!) 160/76 (!) 155/82  Pulse: 70 66  Resp: 16 16  Temp: (!) 36.2 C (!) 36.3 C  SpO2: 98% 98%    Last Pain:  Vitals:   04/09/18 1006  TempSrc: Temporal  PainSc: 0-No pain                 Willette Alma

## 2018-04-09 NOTE — Discharge Instructions (Addendum)
Eye Surgery Discharge Instructions    Expect mild scratchy sensation or mild soreness. DO NOT RUB YOUR EYE!  The day of surgery:  Minimal physical activity, but bed rest is not required  No reading, computer work, or close hand work  No bending, lifting, or straining.  May watch TV  For 24 hours:  No driving, legal decisions, or alcoholic beverages  Safety precautions  Eat anything you prefer: It is better to start with liquids, then soup then solid foods.  Solar shield eyeglasses should be worn for comfort in the sunlight/patch while sleeping  Resume all regular medications including aspirin or Coumadin if these were discontinued prior to surgery. You may shower, bathe, shave, or wash your hair. Tylenol may be taken for mild discomfort. FOLLOW DR. Skip EstimableBRASINGTON'S EYE DROP INSTRUCTION SHEET AS REVIEWED.  Call your doctor if you experience significant pain, nausea, or vomiting, fever > 101 or other signs of infection. [phone] or [phone] Specific instructions:  Follow-up Information    [name], [name], MD Follow up.   Specialty:  Ophthalmology Why:  August 23 at 10:40am Contact information: [address]   [place] [address] [phone]

## 2018-04-09 NOTE — Anesthesia Post-op Follow-up Note (Signed)
Anesthesia QCDR form completed.        

## 2018-04-09 NOTE — Anesthesia Preprocedure Evaluation (Signed)
Anesthesia Evaluation  Patient identified by MRN, date of birth, ID band Patient awake    Reviewed: Allergy & Precautions, NPO status , Patient's Chart, lab work & pertinent test results  History of Anesthesia Complications Negative for: history of anesthetic complications  Airway Mallampati: II       Dental  (+) Missing, Loose, Poor Dentition, Chipped   Pulmonary neg sleep apnea, neg COPD, Current Smoker,           Cardiovascular (-) Past MI and (-) CHF (-) dysrhythmias      Neuro/Psych Seizures - (none in 12 years),     GI/Hepatic Neg liver ROS, neg GERD  ,  Endo/Other  neg diabetes  Renal/GU negative Renal ROS     Musculoskeletal   Abdominal   Peds  Hematology   Anesthesia Other Findings   Reproductive/Obstetrics                            Anesthesia Physical Anesthesia Plan  ASA: III  Anesthesia Plan: MAC   Post-op Pain Management:    Induction: Intravenous  PONV Risk Score and Plan:   Airway Management Planned:   Additional Equipment:   Intra-op Plan:   Post-operative Plan:   Informed Consent: I have reviewed the patients History and Physical, chart, labs and discussed the procedure including the risks, benefits and alternatives for the proposed anesthesia with the patient or authorized representative who has indicated his/her understanding and acceptance.     Plan Discussed with:   Anesthesia Plan Comments:         Anesthesia Quick Evaluation

## 2018-04-09 NOTE — H&P (Signed)
The History and Physical notes are on paper, have been signed, and are to be scanned. The patient remains stable and unchanged from the H&P.   Previous H&P reviewed, patient examined, and there are no changes.  Javier Robinson 04/09/2018 8:34 AM

## 2018-04-09 NOTE — OR Nursing (Signed)
IV to right hand d/c'd postop, gauze/paper tape applied (IV not documented in epic)

## 2018-04-09 NOTE — Op Note (Signed)
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   LENS:   Implant Name Type Inv. Item Serial No. Manufacturer Lot No. LRB No. Used  LENS IOL DIOP 19.5 - [id number] Intraocular Lens LENS IOL DIOP 19.5 [id number] AMO  Left 1  RING CAPSULAR TYPE 14A UNLD - [id number] Ring RING CAPSULAR TYPE 14A UNLD  FCI OPHTHALMICS [id number] Left 1        ULTRASOUND TIME: 20 % of 1 minutes, 16 seconds.  CDE 15.5   SURGEON:  Javier Evenerhadwick R. Brynlynn Walko, MD   ANESTHESIA: Topical with tetracaine drops and 2% Xylocaine jelly, augmented with 1% preservative-free intracameral lidocaine.   COMPLICATIONS:  None.   DESCRIPTION OF PROCEDURE:  The patient was identified in the holding room and transported to the operating room and placed in the supine position under the operating microscope.  The left eye was identified as the operative eye and it was prepped and draped in the usual sterile ophthalmic fashion.   A 1 millimeter clear-corneal paracentesis was made at the 1:30 position.  The anterior chamber was filled with Viscoat viscoelastic.  0.5 ml of preservative-free 1% lidocaine was injected into the anterior chamber.  A 2.4 millimeter keratome was used to make a near-clear corneal incision at the 10:30 position.  A Malyugin pupil expander was then placed through the main incision and into the anterior chamber of the eye.  The edge of the iris was secured on the lip of the pupil expander and it was released, thereby expanding the pupil to approximately 7 millimeters for completion of the cataract surgery.   Additional Viscoat was placed in the anterior chamber.  A cystotome and capsulorrhexis forceps were used to make a curvilinear capsulorrhexis. Zonular laxity was noted during capsulorhexis and hydrodissection with inability to rotate the lens.  Balanced salt solution was used to hydrodissect and hydrodelineate the lens nucleus.   Phacoemulsification was used in stop and chop fashion to remove the lens, nucleus and epinucleus.  The remaining cortex was aspirated using the irrigation aspiration handpiece.  Additional Provisc was placed into the eye to distend the capsular bag for lens placement.  A capsular tension ring was placed into the capsule. A lens was then injected into the capsular bag.  The pupil expanding ring was removed using a Kuglen hook and insertion device. The remaining viscoelastic was aspirated from the capsular bag and the anterior chamber.  The anterior chamber was filled with balanced salt solution to inflate to a physiologic pressure.   Wounds were hydrated with balanced salt solution.  The anterior chamber was inflated to a physiologic pressure with balanced salt solution.  No wound leaks were noted. Vigamox 0.2 ml of a 1mg  per ml solution was injected into the anterior chamber for a dose of 0.2 mg of intracameral antibiotic at the completion of the case.   Timolol and Brimonidine drops were applied to the eye.  The patient was taken to the recovery room in stable condition without complications of anesthesia or surgery.  Javier Robinson 04/09/2018, 10:03 AM

## 2018-04-10 ENCOUNTER — Encounter: Payer: Self-pay | Admitting: Ophthalmology

## 2018-05-04 ENCOUNTER — Encounter: Payer: Self-pay | Admitting: *Deleted

## 2018-05-04 NOTE — Pre-Procedure Instructions (Signed)
RECENT CHEST PAIN. SEEING [facility] 05/05/18. CLEARANCE REQUESTED BY EYE CENTER

## 2018-05-06 NOTE — Pre-Procedure Instructions (Signed)
PATIENT SEEN ,OFFICE CALLED ,NO CLEARANCE DETERMINATION YET. NOTIFIED CINDY AT EYE CENTER

## 2018-05-06 NOTE — Pre-Procedure Instructions (Addendum)
[facility], PATIENT BEING SEEN TODAY AT 1130

## 2018-05-06 NOTE — Pre-Procedure Instructions (Signed)
[facility]. PATIENT CLEARED. WILL FAX CLEARANCE NOTE

## 2018-05-07 ENCOUNTER — Ambulatory Visit: Admission: RE | Admit: 2018-05-07 | Payer: Medicare Other | Source: Ambulatory Visit | Admitting: Ophthalmology

## 2018-05-07 ENCOUNTER — Encounter: Admission: RE | Payer: Self-pay | Source: Ambulatory Visit

## 2018-05-07 SURGERY — PHACOEMULSIFICATION, CATARACT, WITH IOL INSERTION
Anesthesia: Choice | Laterality: Right

## 2018-05-12 MED ORDER — ONDANSETRON HCL 4 MG/2ML IJ SOLN: 4.0000 mg | Freq: Once | INTRAMUSCULAR | Status: DC | PRN

## 2018-07-08 ENCOUNTER — Encounter: Payer: Self-pay | Admitting: *Deleted

## 2018-07-10 NOTE — Pre-Procedure Instructions (Signed)
Office notified that a new H&P would be need before the surgery that was rescheduled for 11/27

## 2018-07-15 ENCOUNTER — Ambulatory Visit: Payer: Medicare Other | Admitting: Certified Registered Nurse Anesthetist

## 2018-07-15 ENCOUNTER — Encounter
Admission: RE | Disposition: A | Discharge status: Home / Self Care | Payer: Self-pay | Source: Ambulatory Visit | Attending: Ophthalmology

## 2018-07-15 ENCOUNTER — Other Ambulatory Visit: Payer: Self-pay

## 2018-07-15 ENCOUNTER — Encounter: Payer: Self-pay | Admitting: *Deleted

## 2018-07-15 ENCOUNTER — Ambulatory Visit
Admission: RE | Admit: 2018-07-15 | Discharge: 2018-07-15 | Disposition: A | Discharge status: Home / Self Care | Payer: Medicare Other | Source: Ambulatory Visit | Attending: Ophthalmology | Admitting: Ophthalmology

## 2018-07-15 DIAGNOSIS — Z79899 Other long term (current) drug therapy: Secondary | ICD-10-CM | POA: Insufficient documentation

## 2018-07-15 DIAGNOSIS — Z9849 Cataract extraction status, unspecified eye: Secondary | ICD-10-CM | POA: Diagnosis not present

## 2018-07-15 DIAGNOSIS — Z96651 Presence of right artificial knee joint: Secondary | ICD-10-CM | POA: Diagnosis not present

## 2018-07-15 DIAGNOSIS — H5703 Miosis: Secondary | ICD-10-CM | POA: Insufficient documentation

## 2018-07-15 DIAGNOSIS — M1990 Unspecified osteoarthritis, unspecified site: Secondary | ICD-10-CM | POA: Insufficient documentation

## 2018-07-15 DIAGNOSIS — R569 Unspecified convulsions: Secondary | ICD-10-CM | POA: Diagnosis not present

## 2018-07-15 DIAGNOSIS — H2511 Age-related nuclear cataract, right eye: Secondary | ICD-10-CM | POA: Insufficient documentation

## 2018-07-15 DIAGNOSIS — F17200 Nicotine dependence, unspecified, uncomplicated: Secondary | ICD-10-CM | POA: Diagnosis not present

## 2018-07-15 HISTORY — PX: CATARACT EXTRACTION W/PHACO: SHX586

## 2018-07-15 SURGERY — PHACOEMULSIFICATION, CATARACT, WITH IOL INSERTION
Anesthesia: Monitor Anesthesia Care | Site: Eye | Laterality: Right

## 2018-07-15 MED ORDER — NEOMYCIN-POLYMYXIN-DEXAMETH 0.1 % OP OINT
TOPICAL_OINTMENT | OPHTHALMIC | Status: DC | PRN
  Administered 2018-07-15: 1 via OPHTHALMIC

## 2018-07-15 MED ORDER — LIDOCAINE HCL (PF) 4 % IJ SOLN
INTRAMUSCULAR | Status: AC
  Filled 2018-07-15: qty 5

## 2018-07-15 MED ORDER — NA HYALUR & NA CHOND-NA HYALUR 0.4-0.35 ML IO KIT
PACK | INTRAOCULAR | Status: DC | PRN
  Administered 2018-07-15: .5 mL via INTRAOCULAR

## 2018-07-15 MED ORDER — LIDOCAINE HCL (PF) 4 % IJ SOLN
INTRAOCULAR | Status: DC | PRN
  Administered 2018-07-15: 4 mL via OPHTHALMIC

## 2018-07-15 MED ORDER — EPINEPHRINE PF 1 MG/ML IJ SOLN
INTRAOCULAR | Status: DC | PRN
  Administered 2018-07-15: 09:00:00 via OPHTHALMIC

## 2018-07-15 MED ORDER — POVIDONE-IODINE 5 % OP SOLN
OPHTHALMIC | Status: AC
  Filled 2018-07-15: qty 30

## 2018-07-15 MED ORDER — FENTANYL CITRATE (PF) 100 MCG/2ML IJ SOLN
INTRAMUSCULAR | Status: AC
  Filled 2018-07-15: qty 2

## 2018-07-15 MED ORDER — EPINEPHRINE PF 1 MG/ML IJ SOLN
INTRAMUSCULAR | Status: AC
  Filled 2018-07-15: qty 2

## 2018-07-15 MED ORDER — MIDAZOLAM HCL 2 MG/2ML IJ SOLN
INTRAMUSCULAR | Status: DC | PRN
  Administered 2018-07-15: 0.5 mg via INTRAVENOUS

## 2018-07-15 MED ORDER — ARMC OPHTHALMIC DILATING DROPS
OPHTHALMIC | Status: AC
  Filled 2018-07-15: qty 0.5

## 2018-07-15 MED ORDER — TETRACAINE HCL 0.5 % OP SOLN
1.0000 [drp] | OPHTHALMIC | Status: AC | PRN
  Administered 2018-07-15 (×3): 1 [drp] via OPHTHALMIC

## 2018-07-15 MED ORDER — MOXIFLOXACIN HCL 0.5 % OP SOLN
1.0000 [drp] | OPHTHALMIC | Status: AC
  Administered 2018-07-15 (×3): 1 [drp] via OPHTHALMIC

## 2018-07-15 MED ORDER — TETRACAINE HCL 0.5 % OP SOLN
OPHTHALMIC | Status: AC
  Administered 2018-07-15: 1 [drp] via OPHTHALMIC
  Filled 2018-07-15: qty 4

## 2018-07-15 MED ORDER — POVIDONE-IODINE 5 % OP SOLN
OPHTHALMIC | Status: DC | PRN
  Administered 2018-07-15: 1 via OPHTHALMIC

## 2018-07-15 MED ORDER — NA HYALUR & NA CHOND-NA HYALUR 0.55-0.5 ML IO KIT
PACK | INTRAOCULAR | Status: AC
  Filled 2018-07-15: qty 1.05

## 2018-07-15 MED ORDER — MOXIFLOXACIN HCL 0.5 % OP SOLN
OPHTHALMIC | Status: AC
  Administered 2018-07-15: 1 [drp] via OPHTHALMIC
  Filled 2018-07-15: qty 6

## 2018-07-15 MED ORDER — NEOMYCIN-POLYMYXIN-DEXAMETH 3.5-10000-0.1 OP OINT
TOPICAL_OINTMENT | OPHTHALMIC | Status: AC
  Filled 2018-07-15: qty 3.5

## 2018-07-15 MED ORDER — SODIUM CHLORIDE 0.9 % IV SOLN
INTRAVENOUS | Status: DC
  Administered 2018-07-15: 50 mL/h via INTRAVENOUS

## 2018-07-15 MED ORDER — MIDAZOLAM HCL 2 MG/2ML IJ SOLN
INTRAMUSCULAR | Status: AC
  Filled 2018-07-15: qty 2

## 2018-07-15 MED ORDER — ARMC OPHTHALMIC DILATING DROPS
1.0000 "application " | OPHTHALMIC | Status: AC
  Administered 2018-07-15 (×2): 1 via OPHTHALMIC
  Administered 2018-07-15: 08:00:00 via OPHTHALMIC

## 2018-07-15 MED ORDER — FENTANYL CITRATE (PF) 100 MCG/2ML IJ SOLN
INTRAMUSCULAR | Status: DC | PRN
  Administered 2018-07-15: 50 ug via INTRAVENOUS

## 2018-07-15 SURGICAL SUPPLY — 20 items
GLOVE BIO SURGEON STRL SZ8 (GLOVE) ×3 IMPLANT
GLOVE BIOGEL M 6.5 STRL (GLOVE) ×3 IMPLANT
GLOVE SURG LX 7.5 STRW (GLOVE) ×2
GLOVE SURG LX STRL 7.5 STRW (GLOVE) ×1 IMPLANT
GOWN STRL REUS W/ TWL LRG LVL3 (GOWN DISPOSABLE) ×2 IMPLANT
GOWN STRL REUS W/TWL LRG LVL3 (GOWN DISPOSABLE) ×4
LABEL CATARACT MEDS ST (LABEL) ×3 IMPLANT
LENS IOL TECNIS ITEC 20.5 (Intraocular Lens) ×3 IMPLANT
NDL HPO THNWL 1X22GA REG BVL (NEEDLE) ×1 IMPLANT
NEEDLE SAFETY 22GX1 (NEEDLE) ×2
PACK CATARACT (MISCELLANEOUS) ×3 IMPLANT
PACK CATARACT BRASINGTON LX (MISCELLANEOUS) ×3 IMPLANT
PACK EYE AFTER SURG (MISCELLANEOUS) ×3 IMPLANT
RING MALYGIN (MISCELLANEOUS) ×3 IMPLANT
SOL BSS BAG (MISCELLANEOUS) ×3
SOLUTION BSS BAG (MISCELLANEOUS) ×1 IMPLANT
SPEAR PVA EYE SURG (MISCELLANEOUS) ×6 IMPLANT
SYR 5ML LL (SYRINGE) ×3 IMPLANT
WATER STERILE IRR 250ML POUR (IV SOLUTION) ×3 IMPLANT
WIPE NON LINTING 3.25X3.25 (MISCELLANEOUS) ×3 IMPLANT

## 2018-07-15 NOTE — H&P (Signed)
The History and Physical notes are on paper, have been signed, and are to be scanned. The patient remains stable and unchanged from the H&P.   Previous H&P reviewed, patient examined, and there are no changes.  Zayne Marovich 07/15/2018 8:54 AM

## 2018-07-15 NOTE — Anesthesia Preprocedure Evaluation (Signed)
Anesthesia Evaluation  Patient identified by MRN, date of birth, ID band Patient awake    Reviewed: Allergy & Precautions, NPO status , Patient's Chart, lab work & pertinent test results, reviewed documented beta blocker date and time   Airway Mallampati: II  TM Distance: >3 FB     Dental  (+) Chipped   Pulmonary Current Smoker,           Cardiovascular      Neuro/Psych Seizures -,     GI/Hepatic   Endo/Other    Renal/GU      Musculoskeletal  (+) Arthritis ,   Abdominal   Peds  Hematology   Anesthesia Other Findings   Reproductive/Obstetrics                             Anesthesia Physical Anesthesia Plan  ASA: III  Anesthesia Plan: MAC   Post-op Pain Management:    Induction:   PONV Risk Score and Plan:   Airway Management Planned:   Additional Equipment:   Intra-op Plan:   Post-operative Plan:   Informed Consent: I have reviewed the patients History and Physical, chart, labs and discussed the procedure including the risks, benefits and alternatives for the proposed anesthesia with the patient or authorized representative who has indicated his/her understanding and acceptance.     Plan Discussed with: CRNA  Anesthesia Plan Comments:         Anesthesia Quick Evaluation

## 2018-07-15 NOTE — Op Note (Signed)
OPERATIVE NOTE  Javier Robinson E Okelly [id number] 07/15/2018   PREOPERATIVE DIAGNOSIS:    Nuclear Sclerotic Cataract Right eye with miotic pupil.        H25.11  POSTOPERATIVE DIAGNOSIS: Nuclear Sclerotic Cataract Right eye with miotic pupil.          PROCEDURE:  Phacoemusification with posterior chamber intraocular lens placement of the right eye which required pupil stretching with the Malyugin pupil expansion device.  LENS:   Implant Name Type Inv. Item Serial No. Manufacturer Lot No. LRB No. Used  LENS IOL DIOP 20.5 - [id number] Intraocular Lens LENS IOL DIOP 20.5 [phone] AMO  Right 1       ULTRASOUND TIME:not recorded  SURGEON:  Deirdre Evenerhadwick R. Emanuella Nickle, MD   ANESTHESIA:  Topical with tetracaine drops and 2% Xylocaine jelly, augmented with 1% preservative-free intracameral lidocaine.   COMPLICATIONS:  None.   DESCRIPTION OF PROCEDURE:  The patient was identified in the holding room and transported to the operating room and placed in the supine position under the operating microscope. Theright eye was identified as the operative eye and it was prepped and draped in the usual sterile ophthalmic fashion.   A 1 millimeter clear-corneal paracentesis was made at the 12:00 position.  0.5 ml of preservative-free 1% lidocaine was injected into the anterior chamber. The anterior chamber was filled with Viscoat viscoelastic.  A 2.4 millimeter keratome was used to make a near-clear corneal incision at the 9:00 position. A Malyugin pupil expander was then placed through the main incision and into the anterior chamber of the eye.  The edge of the iris was secured on the lip of the pupil expander and it was released, thereby expanding the pupil to approximately 6 millimeters for completion of the cataract surgery.  Additional Viscoat was placed in the anterior chamber.  A cystotome and capsulorrhexis forceps were used to make a curvilinear capsulorrhexis.   Balanced salt solution was used to  hydrodissect and hydrodelineate the lens nucleus.   Phacoemulsification was used in stop and chop fashion to remove the lens, nucleus and epinucleus.  The remaining cortex was aspirated using the irrigation aspiration handpiece.  Additional Provisc was placed into the eye to distend the capsular bag for lens placement.  A lens was then injected into the capsular bag.  The pupil expanding ring was removed using a hook and insertion device. The remaining viscoelastic was aspirated from the capsular bag and the anterior chamber.  The anterior chamber was filled with balanced salt solution to inflate to a physiologic pressure.  Wounds were hydrated with balanced salt solution.  The anterior chamber was inflated to a physiologic pressure with balanced salt solution.  Miostat was placed into the anterior chamber.  No wound leaks were noted.Vigamox 0.2 ml of a 1mg  per ml solution was injected into the anterior chamber for a dose of 0.2 mg of intracameral antibiotic at the completion of the case. Maxitrol ointment was applied to the eye.  The patient was taken to the recovery room in stable condition without complications of anesthesia or surgery.  [name] 07/15/2018, 9:46 AM

## 2018-07-15 NOTE — OR Nursing (Signed)
Discharge was pending transportation

## 2018-07-15 NOTE — Anesthesia Post-op Follow-up Note (Signed)
Anesthesia QCDR form completed.        

## 2018-07-15 NOTE — Transfer of Care (Signed)
Immediate Anesthesia Transfer of Care Note  Patient: Javier Robinson  Procedure(s) Performed: CATARACT EXTRACTION PHACO AND INTRAOCULAR LENS PLACEMENT (IOC) (Right Eye)  Patient Location: PACU  Anesthesia Type:MAC  Level of Consciousness: awake, alert  and oriented  Airway & Oxygen Therapy: Patient Spontanous Breathing  Post-op Assessment: Report given to RN and Post -op Vital signs reviewed and stable  Post vital signs: Reviewed and stable  Last Vitals:  Vitals Value Taken Time  BP    Temp    Pulse    Resp    SpO2      Last Pain:  Vitals:   07/15/18 0744  TempSrc: Oral  PainSc: 0-No pain         Complications: No apparent anesthesia complications

## 2018-07-15 NOTE — Discharge Instructions (Addendum)
Eye Surgery Discharge Instructions  Expect mild scratchy sensation or mild soreness. DO NOT RUB YOUR EYE!  The day of surgery:  Minimal physical activity, but bed rest is not required  No reading, computer work, or close hand work  No bending, lifting, or straining.  May watch TV  For 24 hours:  No driving, legal decisions, or alcoholic beverages  Safety precautions  Eat anything you prefer: It is better to start with liquids, then soup then solid foods.  Solar shield eyeglasses should be worn for comfort in the sunlight/patch while sleeping  Resume all regular medications including aspirin or Coumadin if these were discontinued prior to surgery. You may shower, bathe, shave, or wash your hair. Tylenol may be taken for mild discomfort. FOLLOW DR. Skip EstimableBRASINGTON'S EYE DROP INSTRUCTION SHEET AS REVIEWED.  Call your doctor if you experience significant pain, nausea, or vomiting, fever > 101 or other signs of infection. [phone] or [phone] Specific instructions:  Follow-up Information    [name], [name], MD Follow up.   Specialty:  Ophthalmology Why:  07/15/18 @ 3:10 pm Contact information: [address]   [place] [address] [phone]

## 2018-07-15 NOTE — Anesthesia Postprocedure Evaluation (Signed)
Anesthesia Post Note  Patient: Javier Robinson  Procedure(s) Performed: CATARACT EXTRACTION PHACO AND INTRAOCULAR LENS PLACEMENT (IOC) (Right Eye)  Patient location during evaluation: PACU Anesthesia Type: MAC Level of consciousness: awake and alert Pain management: pain level controlled Vital Signs Assessment: post-procedure vital signs reviewed and stable Respiratory status: spontaneous breathing, nonlabored ventilation, respiratory function stable and patient connected to nasal cannula oxygen Cardiovascular status: stable and blood pressure returned to baseline Postop Assessment: no apparent nausea or vomiting Anesthetic complications: no     Last Vitals:  Vitals:   07/15/18 0744 07/15/18 0948  BP: (!) 152/78 (!) 151/67  Pulse: 74 60  Resp: 15 16  Temp: 36.8 C (!) 36.3 C  SpO2: 98% 100%    Last Pain:  Vitals:   07/15/18 0948  TempSrc: Temporal  PainSc: 0-No pain                 [name],Kiyon Fidalgo S

## 2018-07-15 NOTE — Anesthesia Procedure Notes (Signed)
Procedure Name: MAC Performed by: Nawaf Strange, CRNA Pre-anesthesia Checklist: Patient identified, Emergency Drugs available, Suction available, Patient being monitored and Timeout performed Oxygen Delivery Method: Nasal cannula       

## 2019-01-18 ENCOUNTER — Encounter: Payer: Self-pay | Admitting: Urology

## 2019-01-18 ENCOUNTER — Ambulatory Visit: Payer: Self-pay | Admitting: Urology

## 2019-03-04 ENCOUNTER — Ambulatory Visit: Payer: Self-pay | Admitting: Urology

## 2019-03-26 ENCOUNTER — Other Ambulatory Visit: Payer: Self-pay | Admitting: Family Medicine

## 2019-03-26 DIAGNOSIS — R410 Disorientation, unspecified: Secondary | ICD-10-CM

## 2019-03-26 DIAGNOSIS — R29898 Other symptoms and signs involving the musculoskeletal system: Secondary | ICD-10-CM

## 2019-04-02 ENCOUNTER — Ambulatory Visit: Payer: Self-pay | Admitting: Urology

## 2019-04-06 ENCOUNTER — Ambulatory Visit: Payer: Medicare Other

## 2019-04-16 ENCOUNTER — Ambulatory Visit: Payer: Medicare Other

## 2019-05-11 ENCOUNTER — Ambulatory Visit: Payer: Self-pay | Admitting: Urology

## 2019-05-12 ENCOUNTER — Ambulatory Visit: Payer: Self-pay | Admitting: Urology

## 2019-05-12 ENCOUNTER — Encounter: Payer: Self-pay | Admitting: Urology

## 2019-06-30 ENCOUNTER — Inpatient Hospital Stay
Admission: EM | Admit: 2019-06-30 | Discharge: 2019-07-03 | DRG: 641 | Disposition: A | Discharge status: Home Health | Payer: Medicare Other | Attending: Internal Medicine | Admitting: Internal Medicine

## 2019-06-30 ENCOUNTER — Other Ambulatory Visit: Payer: Self-pay

## 2019-06-30 ENCOUNTER — Emergency Department: Payer: Medicare Other

## 2019-06-30 DIAGNOSIS — Z961 Presence of intraocular lens: Secondary | ICD-10-CM | POA: Diagnosis present

## 2019-06-30 DIAGNOSIS — I160 Hypertensive urgency: Secondary | ICD-10-CM | POA: Diagnosis present

## 2019-06-30 DIAGNOSIS — F17210 Nicotine dependence, cigarettes, uncomplicated: Secondary | ICD-10-CM | POA: Diagnosis present

## 2019-06-30 DIAGNOSIS — Z96651 Presence of right artificial knee joint: Secondary | ICD-10-CM | POA: Diagnosis present

## 2019-06-30 DIAGNOSIS — E871 Hypo-osmolality and hyponatremia: Secondary | ICD-10-CM | POA: Diagnosis not present

## 2019-06-30 DIAGNOSIS — I674 Hypertensive encephalopathy: Secondary | ICD-10-CM | POA: Diagnosis present

## 2019-06-30 DIAGNOSIS — Z20828 Contact with and (suspected) exposure to other viral communicable diseases: Secondary | ICD-10-CM | POA: Diagnosis present

## 2019-06-30 DIAGNOSIS — R680 Hypothermia, not associated with low environmental temperature: Secondary | ICD-10-CM | POA: Diagnosis present

## 2019-06-30 DIAGNOSIS — B9689 Other specified bacterial agents as the cause of diseases classified elsewhere: Secondary | ICD-10-CM | POA: Diagnosis present

## 2019-06-30 DIAGNOSIS — R7881 Bacteremia: Secondary | ICD-10-CM | POA: Diagnosis not present

## 2019-06-30 DIAGNOSIS — A419 Sepsis, unspecified organism: Secondary | ICD-10-CM

## 2019-06-30 DIAGNOSIS — I509 Heart failure, unspecified: Secondary | ICD-10-CM | POA: Insufficient documentation

## 2019-06-30 DIAGNOSIS — R296 Repeated falls: Secondary | ICD-10-CM | POA: Diagnosis present

## 2019-06-30 DIAGNOSIS — R6510 Systemic inflammatory response syndrome (SIRS) of non-infectious origin without acute organ dysfunction: Secondary | ICD-10-CM | POA: Diagnosis present

## 2019-06-30 DIAGNOSIS — W19XXXA Unspecified fall, initial encounter: Secondary | ICD-10-CM

## 2019-06-30 DIAGNOSIS — F1010 Alcohol abuse, uncomplicated: Secondary | ICD-10-CM | POA: Diagnosis present

## 2019-06-30 DIAGNOSIS — N400 Enlarged prostate without lower urinary tract symptoms: Secondary | ICD-10-CM | POA: Diagnosis present

## 2019-06-30 DIAGNOSIS — E162 Hypoglycemia, unspecified: Secondary | ICD-10-CM | POA: Diagnosis present

## 2019-06-30 DIAGNOSIS — Z9842 Cataract extraction status, left eye: Secondary | ICD-10-CM

## 2019-06-30 DIAGNOSIS — Z79899 Other long term (current) drug therapy: Secondary | ICD-10-CM | POA: Diagnosis not present

## 2019-06-30 DIAGNOSIS — G9340 Encephalopathy, unspecified: Secondary | ICD-10-CM | POA: Diagnosis not present

## 2019-06-30 DIAGNOSIS — T68XXXA Hypothermia, initial encounter: Secondary | ICD-10-CM

## 2019-06-30 DIAGNOSIS — G40909 Epilepsy, unspecified, not intractable, without status epilepticus: Secondary | ICD-10-CM | POA: Diagnosis present

## 2019-06-30 DIAGNOSIS — Z9841 Cataract extraction status, right eye: Secondary | ICD-10-CM | POA: Diagnosis not present

## 2019-06-30 LAB — COMPREHENSIVE METABOLIC PANEL
ALT: 16 U/L (ref 0–44)
AST: 35 U/L (ref 15–41)
Albumin: 3.5 g/dL (ref 3.5–5.0)
Alkaline Phosphatase: 76 U/L (ref 38–126)
Anion gap: 16 — ABNORMAL HIGH (ref 5–15)
BUN: 5 mg/dL — ABNORMAL LOW (ref 8–23)
CO2: 21 mmol/L — ABNORMAL LOW (ref 22–32)
Calcium: 9 mg/dL (ref 8.9–10.3)
Chloride: 92 mmol/L — ABNORMAL LOW (ref 98–111)
Creatinine, Ser: 0.66 mg/dL (ref 0.61–1.24)
GFR calc Af Amer: >60 mL/min (ref >60)
GFR calc non Af Amer: >60 mL/min (ref >60)
Glucose, Bld: 151 mg/dL — ABNORMAL HIGH (ref 70–99)
Potassium: 4.3 mmol/L (ref 3.5–5.1)
Sodium: 129 mmol/L — ABNORMAL LOW (ref 135–145)
Total Bilirubin: 1.8 mg/dL — ABNORMAL HIGH (ref 0.3–1.2)
Total Protein: 8.6 g/dL — ABNORMAL HIGH (ref 6.5–8.1)

## 2019-06-30 LAB — BASIC METABOLIC PANEL
Anion gap: 15 (ref 5–15)
Anion gap: 13 (ref 5–15)
BUN: 6 mg/dL — ABNORMAL LOW (ref 8–23)
BUN: 9 mg/dL (ref 8–23)
CO2: 20 mmol/L — ABNORMAL LOW (ref 22–32)
CO2: 21 mmol/L — ABNORMAL LOW (ref 22–32)
Calcium: 8.7 mg/dL — ABNORMAL LOW (ref 8.9–10.3)
Calcium: 8.6 mg/dL — ABNORMAL LOW (ref 8.9–10.3)
Chloride: 93 mmol/L — ABNORMAL LOW (ref 98–111)
Chloride: 88 mmol/L — ABNORMAL LOW (ref 98–111)
Creatinine, Ser: 0.49 mg/dL — ABNORMAL LOW (ref 0.61–1.24)
Creatinine, Ser: 0.64 mg/dL (ref 0.61–1.24)
GFR calc Af Amer: >60 mL/min (ref >60)
GFR calc Af Amer: >60 mL/min (ref >60)
GFR calc non Af Amer: >60 mL/min (ref >60)
GFR calc non Af Amer: >60 mL/min (ref >60)
Glucose, Bld: 93 mg/dL (ref 70–99)
Glucose, Bld: 237 mg/dL — ABNORMAL HIGH (ref 70–99)
Potassium: 3.6 mmol/L (ref 3.5–5.1)
Potassium: 3.7 mmol/L (ref 3.5–5.1)
Sodium: 128 mmol/L — ABNORMAL LOW (ref 135–145)
Sodium: 122 mmol/L — ABNORMAL LOW (ref 135–145)

## 2019-06-30 LAB — CBC WITH DIFFERENTIAL/PLATELET
Abs Immature Granulocytes: 0.01 10*3/uL (ref 0.00–0.07)
Basophils Absolute: 0 10*3/uL (ref 0.0–0.1)
Basophils Relative: 1 %
Eosinophils Absolute: 0 10*3/uL (ref 0.0–0.5)
Eosinophils Relative: 0 %
HCT: 34.8 % — ABNORMAL LOW (ref 39.0–52.0)
Hemoglobin: 11.4 g/dL — ABNORMAL LOW (ref 13.0–17.0)
Immature Granulocytes: 0 %
Lymphocytes Relative: 22 %
Lymphs Abs: 1.1 10*3/uL (ref 0.7–4.0)
MCH: 31.8 pg (ref 26.0–34.0)
MCHC: 32.8 g/dL (ref 30.0–36.0)
MCV: 96.9 fL (ref 80.0–100.0)
Monocytes Absolute: 0.6 10*3/uL (ref 0.1–1.0)
Monocytes Relative: 12 %
Neutro Abs: 3.1 10*3/uL (ref 1.7–7.7)
Neutrophils Relative %: 65 %
Platelets: 234 10*3/uL (ref 150–400)
RBC: 3.59 MIL/uL — ABNORMAL LOW (ref 4.22–5.81)
RDW: 18.7 % — ABNORMAL HIGH (ref 11.5–15.5)
WBC: 4.8 10*3/uL (ref 4.0–10.5)
nRBC: 0 % (ref 0.0–0.2)

## 2019-06-30 LAB — URINALYSIS, COMPLETE (UACMP) WITH MICROSCOPIC
Bacteria, UA: NONE SEEN
Bilirubin Urine: NEGATIVE
Glucose, UA: 150 mg/dL — AB
Hgb urine dipstick: NEGATIVE
Ketones, ur: 80 mg/dL — AB
Leukocytes,Ua: NEGATIVE
Nitrite: NEGATIVE
Protein, ur: NEGATIVE mg/dL
Specific Gravity, Urine: 1.012 (ref 1.005–1.030)
pH: 6 (ref 5.0–8.0)

## 2019-06-30 LAB — CBC
HCT: 33 % — ABNORMAL LOW (ref 39.0–52.0)
Hemoglobin: 11 g/dL — ABNORMAL LOW (ref 13.0–17.0)
MCH: 31.6 pg (ref 26.0–34.0)
MCHC: 33.3 g/dL (ref 30.0–36.0)
MCV: 94.8 fL (ref 80.0–100.0)
Platelets: 240 10*3/uL (ref 150–400)
RBC: 3.48 MIL/uL — ABNORMAL LOW (ref 4.22–5.81)
RDW: 18.6 % — ABNORMAL HIGH (ref 11.5–15.5)
WBC: 5.4 10*3/uL (ref 4.0–10.5)
nRBC: 0 % (ref 0.0–0.2)

## 2019-06-30 LAB — URINE DRUG SCREEN, QUALITATIVE (ARMC ONLY)
Amphetamines, Ur Screen: NOT DETECTED
Barbiturates, Ur Screen: NOT DETECTED
Benzodiazepine, Ur Scrn: NOT DETECTED
Cannabinoid 50 Ng, Ur ~~LOC~~: NOT DETECTED
Cocaine Metabolite,Ur ~~LOC~~: NOT DETECTED
MDMA (Ecstasy)Ur Screen: NOT DETECTED
Methadone Scn, Ur: NOT DETECTED
Opiate, Ur Screen: NOT DETECTED
Phencyclidine (PCP) Ur S: NOT DETECTED
Tricyclic, Ur Screen: NOT DETECTED

## 2019-06-30 LAB — GLUCOSE, CAPILLARY
Glucose-Capillary: 121 mg/dL — ABNORMAL HIGH (ref 70–99)
Glucose-Capillary: 124 mg/dL — ABNORMAL HIGH (ref 70–99)
Glucose-Capillary: 128 mg/dL — ABNORMAL HIGH (ref 70–99)
Glucose-Capillary: 130 mg/dL — ABNORMAL HIGH (ref 70–99)
Glucose-Capillary: 183 mg/dL — ABNORMAL HIGH (ref 70–99)
Glucose-Capillary: 242 mg/dL — ABNORMAL HIGH (ref 70–99)
Glucose-Capillary: 400 mg/dL — ABNORMAL HIGH (ref 70–99)
Glucose-Capillary: 55 mg/dL — ABNORMAL LOW (ref 70–99)
Glucose-Capillary: 76 mg/dL (ref 70–99)
Glucose-Capillary: 82 mg/dL (ref 70–99)

## 2019-06-30 LAB — ETHANOL: Alcohol, Ethyl (B): <10 mg/dL (ref <10)

## 2019-06-30 LAB — OSMOLALITY: Osmolality: 268 mOsm/kg — ABNORMAL LOW (ref 275–295)

## 2019-06-30 LAB — SARS CORONAVIRUS 2 (TAT 6-24 HRS): SARS Coronavirus 2: NEGATIVE

## 2019-06-30 LAB — HEMOGLOBIN A1C
Hgb A1c MFr Bld: 5 % (ref 4.8–5.6)
Mean Plasma Glucose: 96.8 mg/dL

## 2019-06-30 LAB — LACTIC ACID, PLASMA
Lactic Acid, Venous: 2 mmol/L (ref 0.5–1.9)
Lactic Acid, Venous: 1.2 mmol/L (ref 0.5–1.9)

## 2019-06-30 LAB — TROPONIN I (HIGH SENSITIVITY)
Troponin I (High Sensitivity): 17 ng/L (ref <18)
Troponin I (High Sensitivity): 28 ng/L — ABNORMAL HIGH (ref <18)

## 2019-06-30 LAB — CORTISOL: Cortisol, Plasma: 14.3 ug/dL

## 2019-06-30 LAB — OSMOLALITY, URINE: Osmolality, Ur: 652 mOsm/kg (ref 300–900)

## 2019-06-30 LAB — MAGNESIUM: Magnesium: 1.6 mg/dL — ABNORMAL LOW (ref 1.7–2.4)

## 2019-06-30 LAB — MRSA PCR SCREENING: MRSA by PCR: NEGATIVE

## 2019-06-30 LAB — PHOSPHORUS: Phosphorus: 2.4 mg/dL — ABNORMAL LOW (ref 2.5–4.6)

## 2019-06-30 LAB — CREATININE, URINE, RANDOM: Creatinine, Urine: 130 mg/dL

## 2019-06-30 LAB — SODIUM, URINE, RANDOM: Sodium, Ur: 151 mmol/L

## 2019-06-30 LAB — TSH: TSH: 3.772 u[IU]/mL (ref 0.350–4.500)

## 2019-06-30 MED ORDER — LORAZEPAM 1 MG PO TABS: 1.0000 mg | ORAL_TABLET | ORAL | Status: AC | PRN

## 2019-06-30 MED ORDER — VANCOMYCIN HCL 1.5 G IV SOLR
1500.0000 mg | Freq: Once | INTRAVENOUS | Status: AC
  Administered 2019-06-30: 1500 mg via INTRAVENOUS
  Filled 2019-06-30: qty 1500

## 2019-06-30 MED ORDER — DEXTROSE 10 % IV SOLN
INTRAVENOUS | Status: DC
  Administered 2019-06-30: 04:00:00 via INTRAVENOUS

## 2019-06-30 MED ORDER — SODIUM CHLORIDE 0.9 % IV SOLN
2.0000 g | Freq: Two times a day (BID) | INTRAVENOUS | Status: DC
  Filled 2019-06-30 (×2): qty 2

## 2019-06-30 MED ORDER — ACETAMINOPHEN 325 MG PO TABS: 650.0000 mg | ORAL_TABLET | Freq: Four times a day (QID) | ORAL | Status: DC | PRN

## 2019-06-30 MED ORDER — METRONIDAZOLE IN NACL 5-0.79 MG/ML-% IV SOLN
500.0000 mg | Freq: Three times a day (TID) | INTRAVENOUS | Status: DC
  Administered 2019-06-30 – 2019-07-01 (×3): 500 mg via INTRAVENOUS
  Filled 2019-06-30 (×5): qty 100

## 2019-06-30 MED ORDER — TRAZODONE HCL 50 MG PO TABS
25.0000 mg | ORAL_TABLET | Freq: Every evening | ORAL | Status: DC | PRN
  Administered 2019-07-01: 25 mg via ORAL
  Filled 2019-06-30: qty 1

## 2019-06-30 MED ORDER — INFLUENZA VAC A&B SA ADJ QUAD 0.5 ML IM PRSY
0.5000 mL | PREFILLED_SYRINGE | INTRAMUSCULAR | Status: DC
  Filled 2019-06-30: qty 0.5

## 2019-06-30 MED ORDER — TAB-A-VITE/IRON PO TABS
1.0000 | ORAL_TABLET | Freq: Every day | ORAL | Status: DC
  Administered 2019-06-30 – 2019-07-03 (×3): 1 via ORAL
  Filled 2019-06-30 (×4): qty 1

## 2019-06-30 MED ORDER — SODIUM CHLORIDE 0.9 % IV SOLN
INTRAVENOUS | Status: DC
  Administered 2019-06-30 – 2019-07-02 (×4): via INTRAVENOUS

## 2019-06-30 MED ORDER — INSULIN ASPART 100 UNIT/ML ~~LOC~~ SOLN
0.0000 [IU] | Freq: Three times a day (TID) | SUBCUTANEOUS | Status: DC
  Filled 2019-06-30: qty 1

## 2019-06-30 MED ORDER — HYDRALAZINE HCL 20 MG/ML IJ SOLN
5.0000 mg | Freq: Once | INTRAMUSCULAR | Status: AC
  Administered 2019-06-30: 5 mg via INTRAVENOUS
  Filled 2019-06-30: qty 1

## 2019-06-30 MED ORDER — GUAIFENESIN ER 600 MG PO TB12
600.0000 mg | ORAL_TABLET | Freq: Two times a day (BID) | ORAL | Status: DC
  Filled 2019-06-30: qty 1

## 2019-06-30 MED ORDER — LABETALOL HCL 5 MG/ML IV SOLN
20.0000 mg | INTRAVENOUS | Status: DC | PRN
  Administered 2019-07-02: 20 mg via INTRAVENOUS
  Filled 2019-06-30: qty 4

## 2019-06-30 MED ORDER — ENOXAPARIN SODIUM 40 MG/0.4ML ~~LOC~~ SOLN
40.0000 mg | SUBCUTANEOUS | Status: DC
  Administered 2019-06-30 – 2019-07-03 (×4): 40 mg via SUBCUTANEOUS
  Filled 2019-06-30 (×4): qty 0.4

## 2019-06-30 MED ORDER — HYDROCOD POLST-CPM POLST ER 10-8 MG/5ML PO SUER: 5.0000 mL | Freq: Two times a day (BID) | ORAL | Status: DC | PRN

## 2019-06-30 MED ORDER — PANTOPRAZOLE SODIUM 40 MG PO TBEC
40.0000 mg | DELAYED_RELEASE_TABLET | Freq: Every day | ORAL | Status: DC
  Administered 2019-06-30 – 2019-07-03 (×4): 40 mg via ORAL
  Filled 2019-06-30 (×4): qty 1

## 2019-06-30 MED ORDER — ONDANSETRON HCL 4 MG PO TABS: 4.0000 mg | ORAL_TABLET | Freq: Four times a day (QID) | ORAL | Status: DC | PRN

## 2019-06-30 MED ORDER — VANCOMYCIN HCL IN DEXTROSE 750-5 MG/150ML-% IV SOLN
750.0000 mg | Freq: Two times a day (BID) | INTRAVENOUS | Status: DC
  Filled 2019-06-30: qty 150

## 2019-06-30 MED ORDER — MAGNESIUM HYDROXIDE 400 MG/5ML PO SUSP: 30.0000 mL | Freq: Every day | ORAL | Status: DC | PRN

## 2019-06-30 MED ORDER — PREDNISOLONE ACETATE 1 % OP SUSP
1.0000 [drp] | Freq: Three times a day (TID) | OPHTHALMIC | Status: DC
  Administered 2019-06-30 – 2019-07-03 (×9): 1 [drp] via OPHTHALMIC
  Filled 2019-06-30: qty 1

## 2019-06-30 MED ORDER — SODIUM CHLORIDE 0.9 % IV SOLN
2.0000 g | Freq: Three times a day (TID) | INTRAVENOUS | Status: DC
  Administered 2019-06-30 – 2019-07-01 (×4): 2 g via INTRAVENOUS
  Filled 2019-06-30 (×7): qty 2

## 2019-06-30 MED ORDER — LORAZEPAM 2 MG/ML IJ SOLN: 1.0000 mg | INTRAMUSCULAR | Status: AC | PRN

## 2019-06-30 MED ORDER — MAGNESIUM SULFATE 4 GM/100ML IV SOLN
4.0000 g | Freq: Once | INTRAVENOUS | Status: AC
  Administered 2019-06-30: 4 g via INTRAVENOUS
  Filled 2019-06-30: qty 100

## 2019-06-30 MED ORDER — ENSURE ENLIVE PO LIQD
237.0000 mL | Freq: Three times a day (TID) | ORAL | Status: DC
  Administered 2019-07-01 – 2019-07-02 (×3): 237 mL via ORAL

## 2019-06-30 MED ORDER — VANCOMYCIN HCL IN DEXTROSE 1-5 GM/200ML-% IV SOLN
1000.0000 mg | Freq: Once | INTRAVENOUS | Status: DC
  Filled 2019-06-30: qty 200

## 2019-06-30 MED ORDER — ACETAMINOPHEN 650 MG RE SUPP: 650.0000 mg | Freq: Four times a day (QID) | RECTAL | Status: DC | PRN

## 2019-06-30 MED ORDER — LORAZEPAM 2 MG PO TABS
0.0000 mg | ORAL_TABLET | Freq: Four times a day (QID) | ORAL | Status: AC
  Administered 2019-07-02: 2 mg via ORAL
  Filled 2019-06-30: qty 1

## 2019-06-30 MED ORDER — POTASSIUM PHOSPHATES 15 MMOLE/5ML IV SOLN
30.0000 mmol | Freq: Once | INTRAVENOUS | Status: AC
  Administered 2019-06-30: 30 mmol via INTRAVENOUS
  Filled 2019-06-30: qty 10

## 2019-06-30 MED ORDER — METRONIDAZOLE IN NACL 5-0.79 MG/ML-% IV SOLN
500.0000 mg | Freq: Once | INTRAVENOUS | Status: AC
  Administered 2019-06-30: 500 mg via INTRAVENOUS
  Filled 2019-06-30: qty 100

## 2019-06-30 MED ORDER — VITAMIN B-1 100 MG PO TABS
100.0000 mg | ORAL_TABLET | Freq: Every day | ORAL | Status: DC
  Administered 2019-06-30 – 2019-07-03 (×4): 100 mg via ORAL
  Filled 2019-06-30 (×5): qty 1

## 2019-06-30 MED ORDER — ONDANSETRON HCL 4 MG/2ML IJ SOLN: 4.0000 mg | Freq: Four times a day (QID) | INTRAMUSCULAR | Status: DC | PRN

## 2019-06-30 MED ORDER — MELOXICAM 7.5 MG PO TABS
15.0000 mg | ORAL_TABLET | Freq: Every day | ORAL | Status: DC
  Administered 2019-06-30 – 2019-07-03 (×3): 15 mg via ORAL
  Filled 2019-06-30 (×4): qty 2

## 2019-06-30 MED ORDER — LORAZEPAM 2 MG/ML IJ SOLN: 1.0000 mg | INTRAMUSCULAR | Status: DC | PRN

## 2019-06-30 MED ORDER — FOLIC ACID 1 MG PO TABS
1.0000 mg | ORAL_TABLET | Freq: Every day | ORAL | Status: DC
  Administered 2019-06-30 – 2019-07-03 (×3): 1 mg via ORAL
  Filled 2019-06-30 (×4): qty 1

## 2019-06-30 MED ORDER — SODIUM CHLORIDE 0.9 % IV SOLN
2.0000 g | Freq: Once | INTRAVENOUS | Status: AC
  Administered 2019-06-30: 2 g via INTRAVENOUS
  Filled 2019-06-30: qty 2

## 2019-06-30 MED ORDER — LORAZEPAM 2 MG PO TABS: 0.0000 mg | ORAL_TABLET | Freq: Two times a day (BID) | ORAL | Status: DC

## 2019-06-30 MED ORDER — GUAIFENESIN ER 600 MG PO TB12
1200.0000 mg | ORAL_TABLET | Freq: Two times a day (BID) | ORAL | Status: DC
  Administered 2019-06-30 – 2019-07-03 (×6): 1200 mg via ORAL
  Filled 2019-06-30 (×7): qty 2

## 2019-06-30 MED ORDER — SODIUM CHLORIDE 0.9 % IV SOLN
500.0000 mg | INTRAVENOUS | Status: DC
  Administered 2019-06-30 – 2019-07-01 (×2): 500 mg via INTRAVENOUS
  Filled 2019-06-30 (×3): qty 500

## 2019-06-30 MED ORDER — PNEUMOCOCCAL VAC POLYVALENT 25 MCG/0.5ML IJ INJ: 0.5000 mL | INJECTION | INTRAMUSCULAR | Status: DC

## 2019-06-30 NOTE — Progress Notes (Signed)
MD Maudie Mercury notified about patients elevated BP this AM. MD to place orders for a one time dose of hydralazine IV 5 mg. Will administer and continue to monitor.   Iran Sizer M

## 2019-06-30 NOTE — Progress Notes (Signed)
Initial Nutrition Assessment  DOCUMENTATION CODES:   Not applicable  INTERVENTION:  Provide Ensure Enlive po TID, each supplement provides 350 kcal and 20 grams of protein. Patient prefers strawberry.  Continue MVI daily, thiamine 544 mg daily, folic acid 1 mg daily.  Continue monitoring potassium, magnesium, and phosphorus daily and replacing as needed as patient is at risk for refeeding syndrome.  NUTRITION DIAGNOSIS:   Inadequate oral intake related to decreased appetite as evidenced by per patient/family report, meal completion < 50%.  GOAL:   Patient will meet greater than or equal to 90% of their needs  MONITOR:   PO intake, Supplement acceptance, Labs, Weight trends, I & O's  REASON FOR ASSESSMENT:   Consult Assessment of nutrition requirement/status  ASSESSMENT:   73 year old male with PMHx of arthritis, seizures, EtOH abuse admitted after a fall with hypoglycemia and generalized weakness, SIRS, hypertensive urgency.   -Following SLP evaluation today diet was downgraded to dysphagia 3 with thin liquids.  Met with patient in room. He was sitting up in bed working on his lunch. He had only taken a few bites and told RD he was full and did not want anymore. He reports he has had a decreased appetite and intake "for a few days" but was unable to provide any further details. Patient does not seem to be the most reliable historian. He reports he is amenable to drinking strawberry Ensure to help meet calorie/protein needs.  Patient was unsure of his UBW or weight trend. According to chart he was previously 84-86 kg > 1 year ago. He was 73 kg on 07/15/2018 and is now 65.8 kg (145 lbs) if current weight is accurate (appears stated). He has lost 7.2 kg (9.9% body weight) over the past year, which is not significant for time frame.  Medications reviewed and include: folic acid 1 mg daily, Ativan, MVI daily, pantoprazole, thiamine 100 mg daily, azithromycin, cefepime, D10 at 100  mL/hr, magnesium sulfate 4 grams IV once today, Flagyl, potassium phosphate 30 mmol IV once today.  Labs reviewed: CBG 76-183, Sodium 128, Chloride 93, CO2 20, BUN 6, Creatinine 0.49, Phosphorus 2.4, Magnesium 1.6.  Patient does not meet criteria for malnutrition at this time but is at risk for malnutrition.  NUTRITION - FOCUSED PHYSICAL EXAM:    Most Recent Value  Orbital Region  No depletion  Upper Arm Region  Mild depletion  Thoracic and Lumbar Region  No depletion  Buccal Region  No depletion  Temple Region  Mild depletion  Clavicle Bone Region  Mild depletion  Clavicle and Acromion Bone Region  Mild depletion  Scapular Bone Region  No depletion  Dorsal Hand  No depletion  Patellar Region  Mild depletion  Anterior Thigh Region  Mild depletion  Posterior Calf Region  No depletion  Edema (RD Assessment)  None  Hair  Reviewed  Eyes  Reviewed  Mouth  Reviewed  Skin  Reviewed  Nails  Reviewed     Diet Order:   Diet Order            DIET DYS 3 Room service appropriate? Yes with Assist; Fluid consistency: Thin  Diet effective now             EDUCATION NEEDS:   No education needs have been identified at this time  Skin:  Skin Assessment: Reviewed RN Assessment  Last BM:  Unknown/PTA  Height:   Ht Readings from Last 1 Encounters:  06/30/19 '5\' 8"'  (1.727 m)   Weight:  Wt Readings from Last 1 Encounters:  06/30/19 65.8 kg   Ideal Body Weight:  70 kg  BMI:  Body mass index is 22.05 kg/m.  Estimated Nutritional Needs:   Kcal:  1700-1900  Protein:  85-95 grams  Fluid:  1.7-1.9 L/day  Jacklynn Barnacle, MS, RD, LDN Office: [phone] Pager: [phone] After Hours/Weekend Pager: [phone]

## 2019-06-30 NOTE — ED Notes (Signed)
Pt has a nasty coarse congested cough. MD notified. Pt says he coughs all the time.
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
## 2019-06-30 NOTE — Progress Notes (Signed)
PHARMACY -  BRIEF ANTIBIOTIC NOTE   Pharmacy has received consult(s) for Cefepime and Vancomycin from an ED provider.  The patient's profile has been reviewed for ht/wt/allergies/indication/available labs.    One time order(s) placed for Cefepime 2000mg  and Vancomycin 1500mg .  Further antibiotics/pharmacy consults should be ordered by admitting physician if indicated.                       Thank you, Hart Robinsons A 06/30/2019  3:39 AM
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
## 2019-06-30 NOTE — ED Triage Notes (Signed)
Pt brought in by [facility] fell at home, wife called ems. FSBS by ems was 37 denies hx of diabetes. EMS gave d10 250 ml, fsbs after was 237. Pt denies any pain at this time.

## 2019-06-30 NOTE — ED Notes (Signed)
Pt incontinent and clothing is soaked with urine. Pt cleaned and dried and new undergarment applied. Pt is alert but confused but can follow some commands. Bed bugs present on pt clothing. One bug captured and given to EVS. Pt given several warm blankets.

## 2019-06-30 NOTE — Progress Notes (Signed)
PHARMACY - PHYSICIAN COMMUNICATION CRITICAL VALUE ALERT - BLOOD CULTURE IDENTIFICATION (BCID)  Javier Robinson is an 73 y.o. male who presented to [facility] on 06/30/2019 with a chief complaint of sepsis  Assessment:  Lab reported gram positive cocci in 1/4 anaerobic bottles.   Name of physician (or Provider) Contacted: Dr. Irine Seal  Current antibiotics: Cefepime, metronidazole, azithromycin  Changes to prescribed antibiotics recommended: No changes recommended currently.  Awaiting organism identification and susceptibilities.  Gerald Dexter 06/30/2019  7:03 PM

## 2019-06-30 NOTE — Consult Note (Signed)
PHARMACY NOTE:  ANTIMICROBIAL RENAL DOSAGE ADJUSTMENT  Current antimicrobial regimen includes a mismatch between antimicrobial dosage and estimated renal function.  As per policy approved by the Pharmacy & Therapeutics and Medical Executive Committees, the antimicrobial dosage will be adjusted accordingly.  Current antimicrobial dosage:  Cefepime 2g q12H   Indication: sepsis  Renal Function:  Estimated Creatinine Clearance: 74.3 mL/min (A) (by C-G formula based on SCr of 0.49 mg/dL (L)).     Antimicrobial dosage has been changed to:  Cefepime 2g q8H   Thank you for allowing pharmacy to be a part of this patient's care.  Oswald Hillock, [facility] 06/30/2019 7:54 AM

## 2019-06-30 NOTE — ED Notes (Signed)
Bair hugger put on pt. Dr Sidney Ace to bedside. Blood sugar checked. Dr Beather Arbour notified read 72.
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
## 2019-06-30 NOTE — Progress Notes (Signed)
Pharmacy Antibiotic Note  Javier Robinson is a 73 y.o. male admitted on 06/30/2019 with sepsis.  Pharmacy has been consulted for Vancomycin dosing.  Plan: Vancomycin 750 mg IV Q 12 hrs. Goal AUC 400-550. Expected AUC: 462.4 SCr used: 0.8   Height: 5\' 8"  (172.7 cm) Weight: 145 lb (65.8 kg) IBW/kg (Calculated) : 68.4  Temp (24hrs), Avg:97.8 F (36.6 C), Min:96.9 F (36.1 C), Max:98.4 F (36.9 C)  Recent Labs  Lab 06/30/19 0143 06/30/19 0158 06/30/19 0626  WBC 4.8  --  5.4  CREATININE 0.66  --   --   LATICACIDVEN  --  2.0*  --     Estimated Creatinine Clearance: 74.3 mL/min (by C-G formula based on SCr of 0.66 mg/dL).    No Known Allergies  Antimicrobials this admission: Cefepime 11/11 >>  Vancomycin 11/11 >>  Flagyl 11/11 >>  Dose adjustments this admission:   Microbiology results:  BCx:   UCx:    Sputum:    MRSA PCR:   Thank you for allowing pharmacy to be a part of this patient's care.  Hart Robinsons A 06/30/2019 7:01 AM
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
## 2019-06-30 NOTE — Progress Notes (Signed)
CODE SEPSIS - PHARMACY COMMUNICATION  **Broad Spectrum Antibiotics should be administered within 1 hour of Sepsis diagnosis**  Time Code Sepsis Called/Page Received: 0236  Antibiotics Ordered: Cefepime, Flagyl, Vancomycin  Time of 1st antibiotic administration: 0248  Additional action taken by pharmacy: n/a  If necessary, Name of Provider/Nurse Contacted: n/a    Javier Robinson ,PharmD Clinical Pharmacist  06/30/2019  2:59 AM

## 2019-06-30 NOTE — Progress Notes (Addendum)
I have seen and assessed patient and agree with Dr. Sidney Ace assessment and plan.  Patient is a 73 year old African-American gentleman history of ongoing tobacco abuse, alcohol use as well as seizure disorder presented with acute onset of fall with inability to get up.  Patient noted to be hypoglycemic with a CBG of 37 and EMS was called.  The patient noted to have a blood pressure of 200/89 with sats of 93% on room air.  Patient also noted to be hyponatremic with a sodium of 129 and a chloride of 92.  Patient also noted to be hypothermic on admission.  Patient pancultured and placed empirically on IV vancomycin, cefepime and Flagyl for suspected sepsis.  Patient also started on a D10W drip due to ongoing hypoglycemia.  Patient also placed on amlodipine for hypertensive urgency.  Will check a urine sodium, urine creatinine, urine osmolality, serum osmolality, TSH, cortisol level.  Repeat basic metabolic profile at this afternoon.  Discontinue IV vancomycin.  Continue empiric IV cefepime, Flagyl and azithromycin.  No charge.Marland Kitchen

## 2019-07-01 DIAGNOSIS — R7881 Bacteremia: Secondary | ICD-10-CM | POA: Diagnosis present

## 2019-07-01 DIAGNOSIS — R6510 Systemic inflammatory response syndrome (SIRS) of non-infectious origin without acute organ dysfunction: Secondary | ICD-10-CM

## 2019-07-01 LAB — CBC
HCT: 30.1 % — ABNORMAL LOW (ref 39.0–52.0)
Hemoglobin: 10.8 g/dL — ABNORMAL LOW (ref 13.0–17.0)
MCH: 32.1 pg (ref 26.0–34.0)
MCHC: 35.9 g/dL (ref 30.0–36.0)
MCV: 89.6 fL (ref 80.0–100.0)
Platelets: 225 10*3/uL (ref 150–400)
RBC: 3.36 MIL/uL — ABNORMAL LOW (ref 4.22–5.81)
RDW: 18.2 % — ABNORMAL HIGH (ref 11.5–15.5)
WBC: 4.5 10*3/uL (ref 4.0–10.5)
nRBC: 0 % (ref 0.0–0.2)

## 2019-07-01 LAB — GLUCOSE, CAPILLARY
Glucose-Capillary: 100 mg/dL — ABNORMAL HIGH (ref 70–99)
Glucose-Capillary: 104 mg/dL — ABNORMAL HIGH (ref 70–99)
Glucose-Capillary: 116 mg/dL — ABNORMAL HIGH (ref 70–99)
Glucose-Capillary: 90 mg/dL (ref 70–99)
Glucose-Capillary: 91 mg/dL (ref 70–99)
Glucose-Capillary: 91 mg/dL (ref 70–99)

## 2019-07-01 LAB — BASIC METABOLIC PANEL
Anion gap: 8 (ref 5–15)
BUN: 6 mg/dL — ABNORMAL LOW (ref 8–23)
CO2: 24 mmol/L (ref 22–32)
Calcium: 8.7 mg/dL — ABNORMAL LOW (ref 8.9–10.3)
Chloride: 95 mmol/L — ABNORMAL LOW (ref 98–111)
Creatinine, Ser: 0.38 mg/dL — ABNORMAL LOW (ref 0.61–1.24)
GFR calc Af Amer: >60 mL/min (ref >60)
GFR calc non Af Amer: >60 mL/min (ref >60)
Glucose, Bld: 103 mg/dL — ABNORMAL HIGH (ref 70–99)
Potassium: 3.6 mmol/L (ref 3.5–5.1)
Sodium: 127 mmol/L — ABNORMAL LOW (ref 135–145)

## 2019-07-01 LAB — MAGNESIUM: Magnesium: 2.2 mg/dL (ref 1.7–2.4)

## 2019-07-01 MED ORDER — VANCOMYCIN HCL 10 G IV SOLR
1750.0000 mg | INTRAVENOUS | Status: DC
  Administered 2019-07-01: 1750 mg via INTRAVENOUS
  Filled 2019-07-01 (×2): qty 1750

## 2019-07-01 MED ORDER — VANCOMYCIN HCL 1.5 G IV SOLR
1500.0000 mg | Freq: Once | INTRAVENOUS | Status: DC
  Filled 2019-07-01: qty 1500

## 2019-07-01 MED ORDER — VANCOMYCIN HCL 10 G IV SOLR
1500.0000 mg | Freq: Once | INTRAVENOUS | Status: DC
  Filled 2019-07-01: qty 1500

## 2019-07-01 MED ORDER — AZITHROMYCIN 250 MG PO TABS
500.0000 mg | ORAL_TABLET | Freq: Every day | ORAL | Status: DC
  Administered 2019-07-02 – 2019-07-03 (×2): 500 mg via ORAL
  Filled 2019-07-01 (×3): qty 2

## 2019-07-01 NOTE — Plan of Care (Signed)
  Problem: Clinical Measurements: Goal: Ability to maintain clinical measurements within normal limits will improve Outcome: Progressing Goal: Respiratory complications will improve Outcome: Progressing Goal: Cardiovascular complication will be avoided Outcome: Progressing   Problem: Safety: Goal: Ability to remain free from injury will improve Outcome: Progressing   

## 2019-07-01 NOTE — Progress Notes (Signed)
Talked to patient's significant other Max Fickle and updated her about the plan of care for this patient. She wanted to be updated in the morning when MD round, her number is [phone].

## 2019-07-01 NOTE — Consult Note (Signed)
Pharmacy Antibiotic Note  Javier Robinson is a 73 y.o. male admitted on 06/30/2019 with bacteremia and sepsis.  Pharmacy has been consulted for Vancomycin dosing.  Plan: Vancomycin 1750 mg IV Q 24 hrs. Goal AUC 400-550. Expected AUC: 496 SCr used: 0.8   Height: 5\' 8"  (172.7 cm) Weight: 157 lb 10.1 oz (71.5 kg) IBW/kg (Calculated) : 68.4  Temp (24hrs), Avg:98.6 F (37 C), Min:98.2 F (36.8 C), Max:99.1 F (37.3 C)  Recent Labs  Lab 06/30/19 0143 06/30/19 0158 06/30/19 0626 06/30/19 1445 07/01/19 0412  WBC 4.8  --  5.4  --  4.5  CREATININE 0.66  --  0.49* 0.64 0.38*  LATICACIDVEN  --  2.0* 1.2  --   --     Estimated Creatinine Clearance: 77.2 mL/min (A) (by C-G formula based on SCr of 0.38 mg/dL (L)).    No Known Allergies  Antimicrobials this admission: 11/11 Azithromycin/Cefepime/Metronidazole >> 11/12 11/12 Vancomycin >>   Dose adjustments this admission: None  Microbiology results: 11/11 BCx: GPC 1/4 bottles anaerobic 11/11 COVID NEG 11/11 MRSA PCR NEG  Thank you for allowing pharmacy to be a part of this patient's care.  Lu Duffel, PharmD, BCPS Clinical Pharmacist 07/01/2019 5:55 PM

## 2019-07-01 NOTE — Progress Notes (Signed)
PROGRESS NOTE    Javier Robinson  WJX:[id number] DOB: October 01, 1945 DOA: 06/30/2019 PCP: Javier Robinson, Javier B, MD   Brief Narrative:  HPI per Dr.Mansy Carolynn Javier Robinson  is a 73 y.o. African-American male with a known history of ongoing tobacco and alcohol abuse as well as seizure disorder, presented to the emergency room with acute onset of fall with subsequent inability to get up.  He stated that his girlfriend called EMS.  He was found in the floor with blood glucose of 37.  He denied any diaphoresis or jitteriness or loss of consciousness.  He denied any recent nausea or vomiting or diarrhea or abdominal pain.  He has been having cough with inability to expectorate.  No dyspnea or wheezing.  No fever or chills.  No recent exposure to COVID-19.  Denies any dysuria, oliguria or hematuria or flank pain.  He was given 250 mL of D10W as blood glucose was up to 237.  His pulse currently was 88% on room air.  He did not have any injuries.  No chest pain or  palpitations.  No paresthesias or focal muscle weakness. The patient was also noted to have bed insets.  Contact precautions were placed.  When he ca and me to the ER, blood pressure was 200/89 with pulse oximetry of 93% on room air, respiratory of 20 and later 24, temperature 96.9 with pulse of 92.  Labs were remarkable for hyponatremia of 129 with hypochloremia of 92, blood glucose of 151 anion gap of 16 with total bili 1.8 lactic acid 2 with high-sensitivity troponin I of 17 and mild anemia.  UA was negative.  Alcohol levels less than 10.  Urine drug screen came back negative.  He had 2 blood cultures sent.  Portable chest ray showed no acute cardiopulmonary disease.  COVID-19 test is currently pending  He was given IV vancomycin cefepime and Flagyl for suspected sepsis.  His blood glucoses come down to 128 and later 55 when he was started on D10W drip.  Will be admitted to a medical monitored bed for further evaluation and management.   Assessment & Plan:    Principal Problem:   Hypoglycemia Active Problems:   SIRS (systemic inflammatory response syndrome) (HCC)   Bacteremia due to Gram-positive bacteria   Hyponatremia   Alcohol abuse   Hypomagnesemia  1 hypoglycemia with generalized weakness and subsequent falls Questionable etiology.  Patient with no history of diabetes.  Hemoglobin A1c 5.0.  Concern for possible infectious etiology.  Patient pancultured.  Preliminary blood cultures concerning for possible gram-positive bacteremia.  Urinalysis unremarkable.  Chest x-ray negative.  Hypoglycemia improving.  D10W discontinued due to worsening hyponatremia.  CBG of 90 this morning.  Continue CBG every 4 hours and if continues to be stable could change to before meals and at bedtime tomorrow.  Discontinue IV azithromycin, IV cefepime, IV Flagyl.  Placed on IV vancomycin pending final culture results.  Supportive care.  PT/OT.  Follow.  2.  Systemic inflammatory response syndrome Patient on admission with hypoglycemia, fall, generalized weakness, noted to be hypothermic did have an elevated lactic acid level, cough and some tachypnea.  Patient pancultured.  Sputum Gram stain and cultures pending.  Discontinue IV Flagyl IV cefepime, IV azithromycin.  Due to concern for cough and possibility of underlying bronchitis will place back on oral azithromycin to complete a course.  There was some concerns that patient did have some bed insects and contact precautions where placed.  However no bed insects noted per RN  so far.  Monitor closely in place contact precautions if needed.  Will place patient on IV vancomycin due to concerns for possible bacteremia as preliminary blood cultures concerning for a gram-positive bacteremia.  Follow.  3.  Hypertensive urgency Blood pressure currently stable.  Labetalol as needed.  4.  Hyponatremia Likely secondary to hypovolemic hyponatremia versus secondary to alcohol abuse.  TSH within normal limits.  Cortisol level within  normal limits.  Urine osmolality was 652.  Urine sodium 151.  Urine creatinine 130.  Serum osmolality 268.  Patient initially was placed on D10 due to hypoglycemia with worsening of hyponatremia.  D10 discontinued patient placed on normal saline with some improvement with hyponatremia.  Sodium at 127.  Follow.  5.  Hypomagnesemia Likely secondary to alcohol abuse.  Repleted.  6.  Ongoing tobacco abuse Tobacco cessation stressed to patient.  7.  Alcohol abuse Continue the Ativan CIWA protocol.  8.  BPH Flomax.  9.   DVT prophylaxis: Lovenox Code Status: Full Family Communication: Updated patient.  No family at bedside. Disposition Plan: To be determined.   Consultants:   None  Procedures:   Chest x-ray 06/30/2019    Antimicrobials:   IV azithromycin 06/30/2019>>>> oral azithromycin 07/01/2019  IV cefepime 06/30/2019>>>>>> 07/01/2019  IV vancomycin 07/01/2019  IV Flagyl 06/30/2019>>>>> 07/01/2019   Subjective: Patient sitting up in bed.  Patient states feeling better.  Denies any chest pain or shortness of breath.  States does not like the food.  No reports of bed insects noted per RN.  Objective: Vitals:   06/30/19 1532 06/30/19 2039 07/01/19 0414 07/01/19 0742  BP: 121/72 (!) 153/99 (!) 158/85 136/86  Pulse: (!) 101 100 93 90  Resp: 18 16 16 19   Temp: 98.2 F (36.8 C) 98.6 F (37 C) 99.1 F (37.3 C) 98.2 F (36.8 C)  TempSrc:  Oral Oral   SpO2: 100% 100% 98% 100%  Weight:   71.5 kg   Height:        Intake/Output Summary (Last 24 hours) at 07/01/2019 1158 Last data filed at 07/01/2019 1017 Gross per 24 hour  Intake 2174.38 ml  Output 1500 ml  Net 674.38 ml   Filed Weights   06/30/19 0133 07/01/19 0414  Weight: 65.8 kg 71.5 kg    Examination:  General exam: Appears calm and comfortable  Respiratory system: Clear to auscultation. Respiratory effort normal. Cardiovascular system: S1 & S2 heard, RRR. No JVD, murmurs, rubs, gallops or clicks.  No pedal edema. Gastrointestinal system: Abdomen is nondistended, soft and nontender. No organomegaly or masses felt. Normal bowel sounds heard. Central nervous system: Alert and oriented. No focal neurological deficits. Extremities: Symmetric 5 x 5 power. Skin: No rashes, lesions or ulcers Psychiatry: Judgement and insight appear normal. Mood & affect appropriate.     Data Reviewed: I have personally reviewed following labs and imaging studies  CBC: Recent Labs  Lab 06/30/19 0143 06/30/19 0626 07/01/19 0412  WBC 4.8 5.4 4.5  NEUTROABS 3.1  --   --   HGB 11.4* 11.0* 10.8*  HCT 34.8* 33.0* 30.1*  MCV 96.9 94.8 89.6  PLT 234 240 734   Basic Metabolic Panel: Recent Labs  Lab 06/30/19 0143 06/30/19 0626 06/30/19 0931 06/30/19 1445 07/01/19 0412  NA 129* 128*  --  122* 127*  K 4.3 3.6  --  3.7 3.6  CL 92* 93*  --  88* 95*  CO2 21* 20*  --  21* 24  GLUCOSE 151* 93  --  237* 103*  BUN 5* 6*  --  9 6*  CREATININE 0.66 0.49*  --  0.64 0.38*  CALCIUM 9.0 8.7*  --  8.6* 8.7*  MG  --   --  1.6*  --  2.2  PHOS  --   --  2.4*  --   --    GFR: Estimated Creatinine Clearance: 77.2 mL/min (A) (by C-G formula based on SCr of 0.38 mg/dL (L)). Liver Function Tests: Recent Labs  Lab 06/30/19 0143  AST 35  ALT 16  ALKPHOS 76  BILITOT 1.8*  PROT 8.6*  ALBUMIN 3.5   No results for input(s): LIPASE, AMYLASE in the last 168 hours. No results for input(s): AMMONIA in the last 168 hours. Coagulation Profile: No results for input(s): INR, PROTIME in the last 168 hours. Cardiac Enzymes: No results for input(s): CKTOTAL, CKMB, CKMBINDEX, TROPONINI in the last 168 hours. BNP (last 3 results) No results for input(s): PROBNP in the last 8760 hours. HbA1C: Recent Labs    06/30/19 0626  HGBA1C 5.0   CBG: Recent Labs  Lab 06/30/19 1611 06/30/19 2127 06/30/19 2350 07/01/19 0411 07/01/19 0744  GLUCAP 242* 124* 121* 90 116*   Lipid Profile: No results for input(s): CHOL, HDL,  LDLCALC, TRIG, CHOLHDL, LDLDIRECT in the last 72 hours. Thyroid Function Tests: Recent Labs    06/30/19 0931  TSH 3.772   Anemia Panel: No results for input(s): VITAMINB12, FOLATE, FERRITIN, TIBC, IRON, RETICCTPCT in the last 72 hours. Sepsis Labs: Recent Labs  Lab 06/30/19 0158 06/30/19 0626  LATICACIDVEN 2.0* 1.2    Recent Results (from the past 240 hour(s))  SARS CORONAVIRUS 2 (TAT 6-24 HRS) Nasopharyngeal Nasopharyngeal Swab     Status: None   Collection Time: 06/30/19  1:43 AM   Specimen: Nasopharyngeal Swab  Result Value Ref Range Status   SARS Coronavirus 2 NEGATIVE NEGATIVE Final    Comment: (NOTE) SARS-CoV-2 target nucleic acids are NOT DETECTED. The SARS-CoV-2 RNA is generally detectable in upper and lower respiratory specimens during the acute phase of infection. Negative results do not preclude SARS-CoV-2 infection, do not rule out co-infections with other pathogens, and should not be used as the sole basis for treatment or other patient management decisions. Negative results must be combined with clinical observations, patient history, and epidemiological information. The expected result is Negative. Fact Sheet for Patients: HairSlick.no Fact Sheet for Healthcare Providers: quierodirigir.com This test is not yet approved or cleared by the [place] FDA and  has been authorized for detection and/or diagnosis of SARS-CoV-2 by FDA under an Emergency Use Authorization (EUA). This EUA will remain  in effect (meaning this test can be used) for the duration of the COVID-19 declaration under Section 56 4(b)(1) of the Act, 21 U.S.C. section 360bbb-3(b)(1), unless the authorization is terminated or revoked sooner. Performed at [facility], [address], [place], [place] [address]   Culture, blood (routine x 2)     Status: None (Preliminary result)   Collection Time: 06/30/19  1:58 AM   Specimen:  BLOOD LEFT FOREARM  Result Value Ref Range Status   Specimen Description BLOOD LEFT FOREARM  Final   Special Requests   Final    BOTTLES DRAWN AEROBIC AND ANAEROBIC Blood Culture adequate volume   Culture   Final    NO GROWTH 1 DAY Performed at [facility], [address], [place], [place] [address]    Report Status PENDING  Incomplete  Culture, blood (routine x 2)  Status: None (Preliminary result)   Collection Time: 06/30/19  1:58 AM   Specimen: BLOOD  Result Value Ref Range Status   Specimen Description BLOOD RIGHT ARM  Final   Special Requests   Final    BOTTLES DRAWN AEROBIC AND ANAEROBIC Blood Culture results may not be optimal due to an excessive volume of blood received in culture bottles   Culture  Setup Time   Final    GRAM POSITIVE COCCI ANAEROBIC BOTTLE ONLY CRITICAL RESULT CALLED TO, READ BACK BY AND VERIFIED WITH: [facility] 06/30/2019 1833 KMP Performed at [facility], [address] [place], [place] [address]    Culture GRAM POSITIVE COCCI  Final   Report Status PENDING  Incomplete  MRSA PCR Screening     Status: None   Collection Time: 06/30/19  9:47 AM   Specimen: Nasal Mucosa; Nasopharyngeal  Result Value Ref Range Status   MRSA by PCR NEGATIVE NEGATIVE Final    Comment:        The GeneXpert MRSA Assay (FDA approved for NASAL specimens only), is one component of a comprehensive MRSA colonization surveillance program. It is not intended to diagnose MRSA infection nor to guide or monitor treatment for MRSA infections. Performed at [facility], [address], [place], [place] [address]          Radiology Studies: Dg Chest [facility] 1 View  Result Date: 06/30/2019 CLINICAL DATA:  Hypoxia EXAM: PORTABLE CHEST 1 VIEW COMPARISON:  February 18, 2014 FINDINGS: There is mild prominence to the cardiac silhouette. The lungs are clear. No focal airspace consolidation or pleural effusion. No acute osseous abnormality. IMPRESSION:  No active disease. Electronically Signed   By: Jonna Clark M.D.   On: 06/30/2019 01:57        Scheduled Meds: . enoxaparin (LOVENOX) injection  40 mg Subcutaneous Q24H  . feeding supplement (ENSURE ENLIVE)  237 mL Oral TID BM  . folic acid  1 mg Oral Daily  . guaiFENesin  1,200 mg Oral BID  . influenza vaccine adjuvanted  0.5 mL Intramuscular Tomorrow-1000  . LORazepam  0-4 mg Oral Q6H   Followed by  . [START ON 07/02/2019] LORazepam  0-4 mg Oral Q12H  . meloxicam  15 mg Oral QPC breakfast  . multivitamins with iron  1 tablet Oral Daily  . pantoprazole  40 mg Oral Q0600  . pneumococcal 23 valent vaccine  0.5 mL Intramuscular Tomorrow-1000  . prednisoLONE acetate  1 drop Left Eye TID  . thiamine  100 mg Oral Daily   Continuous Infusions: . sodium chloride 100 mL/hr at 07/01/19 0519  . azithromycin 500 mg (07/01/19 1005)  . ceFEPime (MAXIPIME) IV 2 g (07/01/19 0520)     LOS: 1 day    Time spent: 40 minutes    Ramiro Harvest, MD [facility]  If 7PM-7AM, please contact night-coverage [email or web address] 07/01/2019, 11:58 AM

## 2019-07-01 NOTE — Evaluation (Signed)
Physical Therapy Evaluation Patient Details Name: Javier Robinson MRN: [id number] DOB: 23-May-1946 Today's Date: 07/01/2019   History of Present Illness  pt is a 73 yo male admitted after fall at home and not being able to get up, blood glucose upon EMS arrival 37. PMH includes ongoing tobacco and alcohol abuse, seizure disorder  Clinical Impression  Pt is a 73 yo male admitted for above. Pt in bed upon arrival and agreed to participate with PT. Pt disoriented unable to recall month or day, stating the year was 2006 and we were in an apartment building. Pt reported living with his wife who is available 24/7 to assist him and is able to assist him off the floor when he falls. Pt reports multiple recent falls. Pt reports previously ambulating with RW and independent with bathing, dressing and his spouse does the cooking and cleaning. Pt required min A to stand and min A to maintain balance in standing secondary to posterior lean. Pt min guard A to ambulate in room with one instance of unsteadiness that pt was able to self correct. Pt presents with decreased strength, balance, cognition, and endurance limiting functional mobility. Pt would benefit from skilled acute PT to improve deficits. Pt would benefit from home health PT following hospital discharge to further improve deficits, decrease fall risk and improve independence.     Follow Up Recommendations Home health PT;Supervision for mobility/OOB    Equipment Recommendations  None recommended by PT    Recommendations for Other Services       Precautions / Restrictions Precautions Precautions: Fall Restrictions Weight Bearing Restrictions: No      Mobility  Bed Mobility Overal bed mobility: Needs Assistance Bed Mobility: Supine to Sit     Supine to sit: Min guard;HOB elevated     General bed mobility comments: min guard for safety, able to get EOB without physical assist, utilized increased time and effort, frequently distracted by  items in bed and IV line  Transfers Overall transfer level: Needs assistance Equipment used: Rolling walker (2 wheeled) Transfers: Sit to/from Stand Sit to Stand: Min assist;From elevated surface         General transfer comment: min A to rise from elevated bed, pt required cuing for hand placement and not pulling on RW with minimal carryover, pt frequently distracted by IV line, posterior lean with initial standing requiring min A to correct  Ambulation/Gait Ambulation/Gait assistance: Min guard Gait Distance (Feet): 15 Feet Assistive device: Rolling walker (2 wheeled) Gait Pattern/deviations: Step-through pattern;Decreased stride length;Wide base of support Gait velocity: decreased   General Gait Details: pt ambulated in room around bed to recliner, pt required min A for RW mgt for tight turn around bed, one instance of unsteadiness with pt able to self correct,  Stairs            Wheelchair Mobility    Modified Rankin (Stroke Patients Only)       Balance Overall balance assessment: History of Falls;Needs assistance Sitting-balance support: Feet supported;Single extremity supported Sitting balance-Leahy Scale: Fair Sitting balance - Comments: steady sitting EOB Postural control: Posterior lean   Standing balance-Leahy Scale: Poor Standing balance comment: pt reliant on RW and min A for standing balance secondary to posterior lean with initial standing, posterior lean decreased with ambulation                             Pertinent Vitals/Pain Pain Assessment: No/denies pain  Home Living Family/patient expects to be discharged to:: Private residence Living Arrangements: Spouse/significant other Available Help at Discharge: Family;Available 24 hours/day Type of Home: Apartment Home Access: Stairs to enter Entrance Stairs-Rails: Left Entrance Stairs-Number of Steps: 3 Home Layout: One level Home Equipment: Cane - single point;Walker - 2 wheels       Prior Function Level of Independence: Independent with assistive device(s)         Comments: pt reports being independent with ADLs, ambulates with RW, spouse cooks and cleans, reports a couple falls in last year and not able to get up independently but stated wife was able to help him up, pt stated he was walking to the fridge to get a beer when he tripped     Hand Dominance        Extremity/Trunk Assessment   Upper Extremity Assessment Upper Extremity Assessment: Generalized weakness;Overall [facility] for tasks assessed    Lower Extremity Assessment Lower Extremity Assessment: Generalized weakness;Difficult to assess due to impaired cognition(strength grossly 3+/5)       Communication   Communication: No difficulties  Cognition Arousal/Alertness: Awake/alert Behavior During Therapy: WFL for tasks assessed/performed Overall Cognitive Status: No family/caregiver present to determine baseline cognitive functioning(pt disoriented stating the year was 2006 and we were in an apartment building, did not know month or day)                                        General Comments      Exercises     Assessment/Plan    PT Assessment Patient needs continued PT services  PT Problem List Decreased mobility;Decreased strength;Decreased safety awareness;Decreased activity tolerance;Decreased balance;Decreased knowledge of use of DME;Decreased cognition       PT Treatment Interventions DME instruction;Therapeutic exercise;Gait training;Balance training;Stair training;Neuromuscular re-education;Functional mobility training;Therapeutic activities;Patient/family education    PT Goals (Current goals can be found in the Care Plan section)  Acute Rehab PT Goals Patient Stated Goal: go home PT Goal Formulation: With patient Time For Goal Achievement: 07/15/19 Potential to Achieve Goals: Good    Frequency Min 2X/week   Barriers to discharge        Co-evaluation                AM-PAC PT "6 Clicks" Mobility  Outcome Measure Help needed turning from your back to your side while in a flat bed without using bedrails?: None Help needed moving from lying on your back to sitting on the side of a flat bed without using bedrails?: A Little Help needed moving to and from a bed to a chair (including a wheelchair)?: A Little Help needed standing up from a chair using your arms (e.g., wheelchair or bedside chair)?: A Little Help needed to walk in hospital room?: A Little Help needed climbing 3-5 steps with a railing? : A Lot 6 Click Score: 18    End of Session Equipment Utilized During Treatment: Gait belt Activity Tolerance: Patient tolerated treatment well Patient left: in chair;with call bell/phone within reach;with chair alarm set Nurse Communication: Mobility status PT Visit Diagnosis: Muscle weakness (generalized) (M62.81);Difficulty in walking, not elsewhere classified (R26.2);Repeated falls (R29.6);Unsteadiness on feet (R26.81)    Time: 5631-4970 PT Time Calculation (min) (ACUTE ONLY): 24 min   Charges:   PT Evaluation $PT Eval Moderate Complexity: 1 Mod         Zayne Draheim PT, DPT 3:09 PM,07/01/19 [phone]

## 2019-07-02 ENCOUNTER — Inpatient Hospital Stay: Payer: Medicare Other

## 2019-07-02 LAB — CBC
HCT: 26.9 % — ABNORMAL LOW (ref 39.0–52.0)
Hemoglobin: 9.6 g/dL — ABNORMAL LOW (ref 13.0–17.0)
MCH: 31.9 pg (ref 26.0–34.0)
MCHC: 35.7 g/dL (ref 30.0–36.0)
MCV: 89.4 fL (ref 80.0–100.0)
Platelets: 201 10*3/uL (ref 150–400)
RBC: 3.01 MIL/uL — ABNORMAL LOW (ref 4.22–5.81)
RDW: 17.9 % — ABNORMAL HIGH (ref 11.5–15.5)
WBC: 4.2 10*3/uL (ref 4.0–10.5)
nRBC: 0 % (ref 0.0–0.2)

## 2019-07-02 LAB — CULTURE, BLOOD (ROUTINE X 2)

## 2019-07-02 LAB — BASIC METABOLIC PANEL
Anion gap: 8 (ref 5–15)
BUN: 5 mg/dL — ABNORMAL LOW (ref 8–23)
CO2: 24 mmol/L (ref 22–32)
Calcium: 8.8 mg/dL — ABNORMAL LOW (ref 8.9–10.3)
Chloride: 101 mmol/L (ref 98–111)
Creatinine, Ser: 0.38 mg/dL — ABNORMAL LOW (ref 0.61–1.24)
GFR calc Af Amer: >60 mL/min (ref >60)
GFR calc non Af Amer: >60 mL/min (ref >60)
Glucose, Bld: 91 mg/dL (ref 70–99)
Potassium: 3.6 mmol/L (ref 3.5–5.1)
Sodium: 133 mmol/L — ABNORMAL LOW (ref 135–145)

## 2019-07-02 LAB — GLUCOSE, CAPILLARY
Glucose-Capillary: 93 mg/dL (ref 70–99)
Glucose-Capillary: 75 mg/dL (ref 70–99)
Glucose-Capillary: 87 mg/dL (ref 70–99)
Glucose-Capillary: 86 mg/dL (ref 70–99)
Glucose-Capillary: 103 mg/dL — ABNORMAL HIGH (ref 70–99)

## 2019-07-02 MED ORDER — AMLODIPINE BESYLATE 5 MG PO TABS
2.5000 mg | ORAL_TABLET | Freq: Every day | ORAL | Status: DC
  Administered 2019-07-02 – 2019-07-03 (×2): 2.5 mg via ORAL
  Filled 2019-07-02 (×3): qty 1

## 2019-07-02 MED ORDER — POTASSIUM CHLORIDE CRYS ER 20 MEQ PO TBCR
40.0000 meq | EXTENDED_RELEASE_TABLET | Freq: Once | ORAL | Status: AC
  Administered 2019-07-02: 40 meq via ORAL
  Filled 2019-07-02 (×2): qty 2

## 2019-07-02 NOTE — Progress Notes (Addendum)
Speech Language Pathology Treatment: Dysphagia  Patient Details Name: Javier Robinson MRN: [id number] DOB: Jun 25, 1946 Today's Date: 07/02/2019 Time: 0905-0950 SLP Time Calculation (min) (ACUTE ONLY): 45 min  Assessment / Plan / Recommendation Clinical Impression  Pt seen today for ongoing assessment of toleration of diet. Pt appeared quite drowsy w/ mumbled speech this morning but verbally agreed to wanting "something to drink". Breakfast tray present and offered also. Pt required full tray setup and positioning for the meal; feeding support - he had difficulty coordinating holding his own Cup. Mod verbal cues to maintain attention to task. NSG reported pt had been agitated during the night shift.  Pt presented w/ declined alertness/awareness for diet w/ increased textured foods; thin liquids - suspect impact of drowsiness and lethargy. He exhibited overt coughing attempting to eat trials of the minced foods (moistened well) followed by sips of water. Verbal cues given to encourage pt to clear the foods orally b/f taking sips but suspect the mixture of both presented difficulty managing and coordinating for swallowing. Pt exhibited a lengthy oral phase time and mastication effort (he is missing Dentition baseline). After pt's coughing episode calmed and pt's respiratory effort was calm, pt was given trials of Nectar liquids via cup and purees w/ no further coughing episodes or other overt s/s of aspiration noted. Oral phase was somewhat improved and more timely w/ the bolus consistencies (purees).  Recommend a modified, downgraded diet consistency while pt's Cognitive/Mentation status is declined. Recommend a Dysphagia level 1 (puree) w/ Nectar liquids; aspiration precautions; Pills in Puree - Crushed as needed. NSG to provided support at meals for supervision and feeding. ST services will f/u on Monday w/ pt's status and readiness to upgrade diet consistency. NSG updated, agreed.      HPI HPI: Pt is  a 73 y.o. African-American male with a known history of ongoing tobacco and alcohol/ETOH abuse as well as seizure disorder, presented to the emergency room with acute onset of fall with subsequent inability to get up.  He stated that his girlfriend called EMS.  He was found in the floor with blood glucose of 37.  He denied any diaphoresis or jitteriness or loss of consciousness.  He denied any recent nausea or vomiting or diarrhea or abdominal pain.  He has been having cough with inability to expectorate.  Pt was admitted w/ low BS, Hyponatremia, low Hemoglobin/RBC, low HCT and Co2 as per labs.  Per CXR: "No focal airspace consolidation or pleural effusion. No acute disease".  Pt denied any difficulty swallowing w/ meals/liquids at home.  Per admitting MD note, bed insects were noted; precautions placed.       SLP Plan  Continue with current plan of care;Goals updated       Recommendations  Diet recommendations: Dysphagia 1 (puree);Nectar-thick liquid Liquids provided via: Cup;Straw(straw w/ Nectar liquids) Medication Administration: Whole meds with puree(vs Crushed in PUree if needed) Supervision: Staff to assist with self feeding;Full supervision/cueing for compensatory strategies Compensations: Minimize environmental distractions;Slow rate;Small sips/bites;Lingual sweep for clearance of pocketing;Follow solids with liquid;Multiple dry swallows after each bite/sip Postural Changes and/or Swallow Maneuvers: Seated upright 90 degrees;Upright 30-60 min after meal                General recommendations: (Dietician f/u) Oral Care Recommendations: Oral care BID;Oral care before and after PO;Staff/trained caregiver to provide oral care Follow up Recommendations: (TBD) SLP Visit Diagnosis: Dysphagia, oropharyngeal phase (R13.12)(declined Cognitive status/awareness to task) Plan: Continue with current plan of care;Goals updated  GO                  Jerilynn Som, MS,  CCC-SLP Javier Robinson 07/02/2019, 11:29 AM

## 2019-07-02 NOTE — TOC Initial Note (Addendum)
Transition of Care [facility]) - Initial/Assessment Note    Patient Details  Name: GALEN RUSSMAN MRN: [id number] Date of Birth: 1946-05-25  Transition of Care [facility]) CM/SW Contact:    Darleene Cleaver, LCSW Phone Number: 07/02/2019, 5:50 PM  Clinical Narrative:                  Patient is a 73 year old male who is alert and oriented x2.  Patient states he lives with his significant other, patient has not had home health before.  CSW explained to patient what home health is and what to expect.  Patient stated he is agreeable to having home health, CSW provided choice of agencies, and he said whoever can take his insurance is fine.  Patient did not express any other questions or concerns.  Patient did not feel like he needed assistance with alcohol abuse.  Expected Discharge Plan: Home w Home Health Services Barriers to Discharge: Continued Medical Work up   Patient Goals and CMS Choice Patient states their goals for this hospitalization and ongoing recovery are:: To return home with home health. CMS Medicare.gov Compare Post Acute Care list provided to:: Patient Choice offered to / list presented to : Patient  Expected Discharge Plan and Services Expected Discharge Plan: Home w Home Health Services     Post Acute Care Choice: Home Health Living arrangements for the past 2 months: Single Family Home                 DME Arranged: N/A DME Agency: NA                  Prior Living Arrangements/Services Living arrangements for the past 2 months: Single Family Home Lives with:: Significant Other Patient language and need for interpreter reviewed:: Yes Do you feel safe going back to the place where you live?: Yes      Need for Family Participation in Patient Care: Yes (Comment) Care giver support system in place?: Yes (comment)   Criminal Activity/Legal Involvement Pertinent to Current Situation/Hospitalization: No - Comment as needed  Activities of Daily Living Home Assistive  Devices/Equipment: Scales, Cane (specify quad or straight) ADL Screening (condition at time of admission) Is the patient deaf or have difficulty hearing?: No Does the patient have difficulty seeing, even when wearing glasses/contacts?: No Does the patient have difficulty concentrating, remembering, or making decisions?: No Patient able to express need for assistance with ADLs?: Yes Does the patient have difficulty dressing or bathing?: No Independently performs ADLs?: Yes (appropriate for developmental age) Does the patient have difficulty walking or climbing stairs?: No Weakness of Legs: Both Weakness of Arms/Hands: Right  Permission Sought/Granted Permission sought to share information with : Family Supports, Case Manager Permission granted to share information with : Yes, Verbal Permission Granted  Share Information with NAME: Haskell Flirt [phone]  Permission granted to share info w AGENCY: Home health agencies        Emotional Assessment Appearance:: Appears stated age Attitude/Demeanor/Rapport: Engaged   Orientation: : Oriented to Self, Oriented to Place Alcohol / Substance Use: Alcohol Use Psych Involvement: No (comment)  Admission diagnosis:  Hyponatremia [E87.1] Hypoglycemia [E16.2] Hypothermia, initial encounter [T68.XXXA] Fall, initial encounter [id number]] Sepsis, due to unspecified organism, unspecified whether acute organ dysfunction present [facility]) [A41.9] Patient Active Problem List   Diagnosis Date Noted  . Bacteremia due to Gram-positive bacteria 07/01/2019  . Acute CHF (congestive heart failure) (HCC) 06/30/2019  . Hypoglycemia 06/30/2019  . SIRS (systemic inflammatory response syndrome) (HCC)  06/30/2019  . Hyponatremia 06/30/2019  . Alcohol abuse 06/30/2019  . Hypomagnesemia 06/30/2019   PCP:  Marden Noble, MD Pharmacy:   [place], [place] - [address] [place] [address] Phone: [phone] Fax:  [phone]     Social Determinants of Health (SDOH) Interventions    Readmission Risk Interventions No flowsheet data found.

## 2019-07-02 NOTE — Progress Notes (Addendum)
PROGRESS NOTE    Javier Robinson  UJW:[id number] DOB: December 08, 1945 DOA: 06/30/2019 PCP: Derwood KaplanEason, Javier B, MD   Brief Narrative:  HPI per Dr.Mansy [name]  is a 73 y.o. African-American male with a known history of ongoing tobacco and alcohol abuse as well as seizure disorder, presented to the emergency room with acute onset of fall with subsequent inability to get up.  He stated that his girlfriend called EMS.  He was found in the floor with blood glucose of 37.  He denied any diaphoresis or jitteriness or loss of consciousness.  He denied any recent nausea or vomiting or diarrhea or abdominal pain.  He has been having cough with inability to expectorate.  No dyspnea or wheezing.  No fever or chills.  No recent exposure to COVID-19.  Denies any dysuria, oliguria or hematuria or flank pain.  He was given 250 mL of D10W as blood glucose was up to 237.  His pulse currently was 88% on room air.  He did not have any injuries.  No chest pain or  palpitations.  No paresthesias or focal muscle weakness. The patient was also noted to have bed insets.  Contact precautions were placed.  When he ca and me to the ER, blood pressure was 200/89 with pulse oximetry of 93% on room air, respiratory of 20 and later 24, temperature 96.9 with pulse of 92.  Labs were remarkable for hyponatremia of 129 with hypochloremia of 92, blood glucose of 151 anion gap of 16 with total bili 1.8 lactic acid 2 with high-sensitivity troponin I of 17 and mild anemia.  UA was negative.  Alcohol levels less than 10.  Urine drug screen came back negative.  He had 2 blood cultures sent.  Portable chest ray showed no acute cardiopulmonary disease.  COVID-19 test is currently pending  He was given IV vancomycin cefepime and Flagyl for suspected sepsis.  His blood glucoses come down to 128 and later 55 when he was started on D10W drip.  Will be admitted to a medical monitored bed for further evaluation and management.   Assessment & Plan:    Principal Problem:   Hypoglycemia Active Problems:   SIRS (systemic inflammatory response syndrome) (HCC)   Bacteremia due to Gram-positive bacteria   Hyponatremia   Alcohol abuse   Hypomagnesemia  1 hypoglycemia with generalized weakness and subsequent falls Questionable etiology.  Patient with no history of diabetes.  Hemoglobin A1c 5.0.  Concern for possible infectious etiology.  Patient pancultured.  Preliminary blood cultures concerning for possible gram-positive bacteremia.  Urinalysis unremarkable.  Chest x-ray negative.  Hypoglycemia improving.  D10W discontinued due to worsening hyponatremia.  CBG of 75 this morning.  Change CBG to achs.  Discontinued IV cefepime, IV Flagyl.  Patient was placed on IV vancomycin which we will discontinue now as blood cultures likely contaminant.  We will place on oral azithromycin.  Continue supportive care.  PT/OT.   2.  Systemic inflammatory response syndrome Patient on admission with hypoglycemia, fall, generalized weakness, noted to be hypothermic did have an elevated lactic acid level, cough and some tachypnea.  Patient pancultured.  Sputum Gram stain and cultures pending.  Discontinued IV Flagyl IV cefepime, IV azithromycin.  Due to concern for cough and possibility of underlying bronchitis will place back on oral azithromycin to complete a course.  There was some concerns that patient did have some bed insects and contact precautions where placed.  However no bed insects noted per RN so far.  Monitor closely in place contact precautions if needed.  Blood cultures with coagulase-negative staph likely contaminant.  Discontinue IV vancomycin.  Change IV azithromycin to oral azithromycin to complete a 5-day course of antibiotic treatment.  Follow.    3.  Hypertensive urgency Blood pressure somewhat elevated this morning.  Will start Norvasc 2.5 mg p.o. daily. Labetalol as needed.  4.  Hyponatremia Likely secondary to hypovolemic hyponatremia versus  secondary to alcohol abuse.  TSH within normal limits.  Cortisol level within normal limits.  Urine osmolality was 652.  Urine sodium 151.  Urine creatinine 130.  Serum osmolality 268.  Patient initially was placed on D10 due to hypoglycemia with worsening of hyponatremia.  D10 discontinued patient placed on normal saline with some improvement with hyponatremia.  Sodium improving and currently at 133.  Saline lock IV fluids.  Follow.    5.  Hypomagnesemia Likely secondary to alcohol abuse.  Repleted.  6.  Ongoing tobacco abuse Tobacco cessation stressed to patient.  7.  Alcohol abuse Continue the Ativan CIWA protocol.  8.  BPH Continue Flomax.    DVT prophylaxis: Lovenox Code Status: Full Family Communication: Updated patient.  Updated girlfriend Javier Robinson on telephone. Disposition Plan: To be determined.   Consultants:   None  Procedures:   Chest x-ray 06/30/2019    Antimicrobials:   IV azithromycin 06/30/2019>>>> oral azithromycin 07/01/2019  IV cefepime 06/30/2019>>>>>> 07/01/2019  IV vancomycin 07/01/2019>>>>>> 07/02/2019  IV Flagyl 06/30/2019>>>>> 07/01/2019   Subjective: Patient sitting up in bed.  Denies any chest pain or shortness of breath.   Objective: Vitals:   07/02/19 0354 07/02/19 0819 07/02/19 1246 07/02/19 1610  BP: (!) 173/82 (!) 174/93 127/73 (!) 142/83  Pulse: 91 87 80 82  Resp: 18 19 18 19   Temp: 98.7 F (37.1 C) 98.2 F (36.8 C)  98.6 F (37 C)  TempSrc:    Oral  SpO2: 100% 99% 97% 99%  Weight: 66.3 kg     Height:        Intake/Output Summary (Last 24 hours) at 07/02/2019 1654 Last data filed at 07/02/2019 1300 Gross per 24 hour  Intake 2749.07 ml  Output 2350 ml  Net 399.07 ml   Filed Weights   06/30/19 0133 07/01/19 0414 07/02/19 0354  Weight: 65.8 kg 71.5 kg 66.3 kg    Examination:  General exam: NAD Respiratory system: Clear to auscultation bilaterally.  No wheezes, no crackles, no rhonchi.  Normal respiratory  effort.  Cardiovascular system: Regular rate and rhythm no murmurs rubs or gallops.  No JVD.  No lower extremity edema.  Gastrointestinal system: Abdomen is soft, nontender, nondistended, positive bowel sounds.  No rebound.  No guarding.  Central nervous system: Alert and oriented. No focal neurological deficits. Extremities: Symmetric 5 x 5 power. Skin: No rashes, lesions or ulcers Psychiatry: Judgement and insight appear normal. Mood & affect appropriate.     Data Reviewed: I have personally reviewed following labs and imaging studies  CBC: Recent Labs  Lab 06/30/19 0143 06/30/19 0626 07/01/19 0412 07/02/19 0658  WBC 4.8 5.4 4.5 4.2  NEUTROABS 3.1  --   --   --   HGB 11.4* 11.0* 10.8* 9.6*  HCT 34.8* 33.0* 30.1* 26.9*  MCV 96.9 94.8 89.6 89.4  PLT 234 240 225 201   Basic Metabolic Panel: Recent Labs  Lab 06/30/19 0143 06/30/19 0626 06/30/19 0931 06/30/19 1445 07/01/19 0412 07/02/19 0658  NA 129* 128*  --  122* 127* 133*  K 4.3 3.6  --  3.7 3.6 3.6  CL 92* 93*  --  88* 95* 101  CO2 21* 20*  --  21* 24 24  GLUCOSE 151* 93  --  237* 103* 91  BUN 5* 6*  --  9 6* 5*  CREATININE 0.66 0.49*  --  0.64 0.38* 0.38*  CALCIUM 9.0 8.7*  --  8.6* 8.7* 8.8*  MG  --   --  1.6*  --  2.2  --   PHOS  --   --  2.4*  --   --   --    GFR: Estimated Creatinine Clearance: 74.8 mL/min (A) (by C-G formula based on SCr of 0.38 mg/dL (L)). Liver Function Tests: Recent Labs  Lab 06/30/19 0143  AST 35  ALT 16  ALKPHOS 76  BILITOT 1.8*  PROT 8.6*  ALBUMIN 3.5   No results for input(s): LIPASE, AMYLASE in the last 168 hours. No results for input(s): AMMONIA in the last 168 hours. Coagulation Profile: No results for input(s): INR, PROTIME in the last 168 hours. Cardiac Enzymes: No results for input(s): CKTOTAL, CKMB, CKMBINDEX, TROPONINI in the last 168 hours. BNP (last 3 results) No results for input(s): PROBNP in the last 8760 hours. HbA1C: Recent Labs    06/30/19 0626   HGBA1C 5.0   CBG: Recent Labs  Lab 07/01/19 2334 07/02/19 0352 07/02/19 0819 07/02/19 1204 07/02/19 1612  GLUCAP 91 93 75 87 86   Lipid Profile: No results for input(s): CHOL, HDL, LDLCALC, TRIG, CHOLHDL, LDLDIRECT in the last 72 hours. Thyroid Function Tests: Recent Labs    06/30/19 0931  TSH 3.772   Anemia Panel: No results for input(s): VITAMINB12, FOLATE, FERRITIN, TIBC, IRON, RETICCTPCT in the last 72 hours. Sepsis Labs: Recent Labs  Lab 06/30/19 0158 06/30/19 0626  LATICACIDVEN 2.0* 1.2    Recent Results (from the past 240 hour(s))  SARS CORONAVIRUS 2 (TAT 6-24 HRS) Nasopharyngeal Nasopharyngeal Swab     Status: None   Collection Time: 06/30/19  1:43 AM   Specimen: Nasopharyngeal Swab  Result Value Ref Range Status   SARS Coronavirus 2 NEGATIVE NEGATIVE Final    Comment: (NOTE) SARS-CoV-2 target nucleic acids are NOT DETECTED. The SARS-CoV-2 RNA is generally detectable in upper and lower respiratory specimens during the acute phase of infection. Negative results do not preclude SARS-CoV-2 infection, do not rule out co-infections with other pathogens, and should not be used as the sole basis for treatment or other patient management decisions. Negative results must be combined with clinical observations, patient history, and epidemiological information. The expected result is Negative. Fact Sheet for Patients: SugarRoll.be Fact Sheet for Healthcare Providers: https://www.woods-mathews.com/ This test is not yet approved or cleared by the [place] FDA and  has been authorized for detection and/or diagnosis of SARS-CoV-2 by FDA under an Emergency Use Authorization (EUA). This EUA will remain  in effect (meaning this test can be used) for the duration of the COVID-19 declaration under Section 56 4(b)(1) of the Act, 21 U.S.C. section 360bbb-3(b)(1), unless the authorization is terminated or revoked  sooner. Performed at [facility], [place] [address], [place], [place] [address]   Culture, blood (routine x 2)     Status: None (Preliminary result)   Collection Time: 06/30/19  1:58 AM   Specimen: BLOOD LEFT FOREARM  Result Value Ref Range Status   Specimen Description BLOOD LEFT FOREARM  Final   Special Requests   Final    BOTTLES DRAWN AEROBIC AND ANAEROBIC Blood Culture adequate volume  Culture   Final    NO GROWTH 2 DAYS Performed at [facility], [address] [place], [place] [address]    Report Status PENDING  Incomplete  Culture, blood (routine x 2)     Status: Abnormal   Collection Time: 06/30/19  1:58 AM   Specimen: BLOOD  Result Value Ref Range Status   Specimen Description   Final    BLOOD RIGHT ARM Performed at [facility], [address], [place], [place] [address]    Special Requests   Final    BOTTLES DRAWN AEROBIC AND ANAEROBIC Blood Culture results may not be optimal due to an excessive volume of blood received in culture bottles Performed at [facility], [address], [place], [place] [address]    Culture  Setup Time   Final    GRAM POSITIVE COCCI ANAEROBIC BOTTLE ONLY CRITICAL RESULT CALLED TO, READ BACK BY AND VERIFIED WITH: CHRIS [facility] 06/30/2019 1833 KMP Performed at [facility], [address] [place], [place] [address]    Culture (A)  Final    STAPHYLOCOCCUS SPECIES (COAGULASE NEGATIVE) THE SIGNIFICANCE OF ISOLATING THIS ORGANISM FROM A SINGLE SET OF BLOOD CULTURES WHEN MULTIPLE SETS ARE DRAWN IS UNCERTAIN. PLEASE NOTIFY THE MICROBIOLOGY DEPARTMENT WITHIN ONE WEEK IF SPECIATION AND SENSITIVITIES ARE REQUIRED. Performed at [facility], [address] [place], [place] [address]    Report Status 07/02/2019 FINAL  Final  MRSA PCR Screening     Status: None   Collection Time: 06/30/19  9:47 AM   Specimen: Nasal Mucosa; Nasopharyngeal  Result Value Ref Range Status   MRSA by PCR NEGATIVE  NEGATIVE Final    Comment:        The GeneXpert MRSA Assay (FDA approved for NASAL specimens only), is one component of a comprehensive MRSA colonization surveillance program. It is not intended to diagnose MRSA infection nor to guide or monitor treatment for MRSA infections. Performed at [facility], [address], [place], [place] [address]          Radiology Studies: No results found.      Scheduled Meds: . amLODipine  2.5 mg Oral Daily  . azithromycin  500 mg Oral Daily  . enoxaparin (LOVENOX) injection  40 mg Subcutaneous Q24H  . feeding supplement (ENSURE ENLIVE)  237 mL Oral TID BM  . folic acid  1 mg Oral Daily  . guaiFENesin  1,200 mg Oral BID  . influenza vaccine adjuvanted  0.5 mL Intramuscular Tomorrow-1000  . LORazepam  0-4 mg Oral Q12H  . meloxicam  15 mg Oral QPC breakfast  . multivitamins with iron  1 tablet Oral Daily  . pantoprazole  40 mg Oral Q0600  . pneumococcal 23 valent vaccine  0.5 mL Intramuscular Tomorrow-1000  . prednisoLONE acetate  1 drop Left Eye TID  . thiamine  100 mg Oral Daily   Continuous Infusions:    LOS: 2 days    Time spent: 40 minutes    Ramiro Harvest, MD [facility]  If 7PM-7AM, please contact night-coverage [email or web address] 07/02/2019, 4:54 PM

## 2019-07-02 NOTE — Care Management Important Message (Signed)
Important Message  Patient Details  Name: Javier Robinson MRN: [id number] Date of Birth: 1945-11-04   Medicare Important Message Given:  Yes     Dannette Barbara 07/02/2019, 5:20 PM

## 2019-07-02 NOTE — Evaluation (Signed)
Occupational Therapy Evaluation Patient Details Name: Javier Robinson MRN: [id number] DOB: 1946-03-14 Today's Date: 07/02/2019    History of Present Illness pt is a 73 yo male admitted after fall at home and not being able to get up, blood glucose upon EMS arrival 37. PMH includes ongoing tobacco and alcohol abuse, seizure disorder   Clinical Impression   Pt seen for OT evaluation this date. Prior to hospital admission, pt was independent with ADL, ambulating with RW, and girlfriend assisted with med mgt, meal prep, and cleaning. Pt calls family for transportation and groceries since pt does not drive and girlfriend lost her license per pt report. Pt very fatigued upon OT's arrival, keeping eyes closed unless asked to open them briefly. Pt oriented to self and general place and can follow simple commands with cues. Currently pt demonstrates impairments in strength, activity tolerance, balance, and cognition requiring min-mod assist for bathing, and dressing, CGA for functional ADL transfers.  Pt would benefit from skilled OT to address noted impairments and functional limitations (see below for any additional details) in order to maximize safety and independence while minimizing falls risk and caregiver burden.  Upon hospital discharge, recommend pt discharge with HHOT services and 24/7 supervision/assist.    Follow Up Recommendations  Home health OT;Supervision/Assistance - 24 hour    Equipment Recommendations  None recommended by OT    Recommendations for Other Services       Precautions / Restrictions Precautions Precautions: Fall Restrictions Weight Bearing Restrictions: No      Mobility Bed Mobility Overal bed mobility: Needs Assistance Bed Mobility: Supine to Sit;Sit to Supine     Supine to sit: Min guard;HOB elevated Sit to supine: Min guard      Transfers                      Balance                                           ADL either  performed or assessed with clinical judgement   ADL Overall ADL's : Needs assistance/impaired Eating/Feeding: Set up;Supervision/ safety   Grooming: Sitting;Minimal assistance   Upper Body Bathing: Sitting;Minimal assistance   Lower Body Bathing: Sitting/lateral leans;Moderate assistance;Minimal assistance   Upper Body Dressing : Minimal assistance;Sitting   Lower Body Dressing: Sitting/lateral leans;Moderate assistance;Minimal assistance   Toilet Transfer: Min guard;BSC;RW                   Vision Baseline Vision/History: No visual deficits Patient Visual Report: No change from baseline Vision Assessment?: No apparent visual deficits     Perception     Praxis      Pertinent Vitals/Pain Pain Assessment: No/denies pain     Hand Dominance Right(but reports has had to use LUE more since CVA "a few months ago")   Extremity/Trunk Assessment Upper Extremity Assessment Upper Extremity Assessment: Generalized weakness;Difficult to assess due to impaired cognition(decreased shoulder ROM per pt, worse on R)   Lower Extremity Assessment Lower Extremity Assessment: Generalized weakness;Difficult to assess due to impaired cognition       Communication Communication Communication: No difficulties   Cognition Arousal/Alertness: Lethargic Behavior During Therapy: WFL for tasks assessed/performed Overall Cognitive Status: No family/caregiver present to determine baseline cognitive functioning  General Comments: Pt lethargic, reports being sleepy, opens eyes briefly, oriented to self, generally to place (hospital), and unsure of situation or date; follows simple commands   General Comments       Exercises     Shoulder Instructions      Home Living Family/patient expects to be discharged to:: Private residence Living Arrangements: Spouse/significant other(girlfriend) Available Help at Discharge: Family;Available 24  hours/day Type of Home: Apartment Home Access: Stairs to enter Entrance Stairs-Number of Steps: 3 Entrance Stairs-Rails: Left Home Layout: One level     Bathroom Shower/Tub: Occupational psychologist: Standard     Home Equipment: Cane - single point;Walker - 2 wheels;Bedside commode;Shower seat          Prior Functioning/Environment Level of Independence: Independent with assistive device(s)        Comments: pt reports being independent with ADLs, ambulates with RW, spouse cooks and cleans, reports a couple falls in last year and not able to get up independently but stated girlfriend was able to help him up, pt stated he was walking to the fridge to get a beer when he tripped        OT Problem List: Decreased strength;Decreased range of motion;Decreased cognition;Decreased activity tolerance;Impaired balance (sitting and/or standing);Decreased knowledge of use of DME or AE;Impaired UE functional use      OT Treatment/Interventions: Self-care/ADL training;Therapeutic exercise;Therapeutic activities;DME and/or AE instruction;Patient/family education;Balance training    OT Goals(Current goals can be found in the care plan section) Acute Rehab OT Goals Patient Stated Goal: go home OT Goal Formulation: With patient Time For Goal Achievement: 07/16/19 Potential to Achieve Goals: Good ADL Goals Pt Will Perform Lower Body Dressing: with min guard assist;sit to/from stand;with adaptive equipment Pt Will Transfer to Toilet: with supervision;ambulating(BSC over toilet, LRAD For amb)  OT Frequency: Min 1X/week   Barriers to D/C:            Co-evaluation              AM-PAC OT "6 Clicks" Daily Activity     Outcome Measure Help from another person eating meals?: A Little Help from another person taking care of personal grooming?: A Little Help from another person toileting, which includes using toliet, bedpan, or urinal?: A Little Help from another person bathing  (including washing, rinsing, drying)?: A Lot Help from another person to put on and taking off regular upper body clothing?: A Little Help from another person to put on and taking off regular lower body clothing?: A Lot 6 Click Score: 16   End of Session    Activity Tolerance: Patient limited by fatigue Patient left: in bed;with call bell/phone within reach;with bed alarm set  OT Visit Diagnosis: Other abnormalities of gait and mobility (R26.89);Muscle weakness (generalized) (M62.81)                Time: 4098-1191 OT Time Calculation (min): 10 min Charges:  OT General Charges $OT Visit: 1 Visit OT Evaluation $OT Eval Moderate Complexity: 1 Mod  Jeni Salles, MPH, MS, OTR/L ascom [phone] 07/02/19, 3:03 PM

## 2019-07-02 NOTE — Progress Notes (Signed)
Ch received a referral to visit with pt. Pt is a 73 y.o. w/ a hx of ETOH abuse, diabetes and seizures. Pt was resting upon ch arrival. Ch tried to contact pt's significant other via telephone w/o success.  F/u care recommended.    07/02/19 1100  Clinical Encounter Type  Visited With Patient;Health care provider  Visit Type Social support  Referral From Other (Comment) (Speech Ther)  Consult/Referral To Chaplain  Recommendations f/u for spiritual care support  Spiritual Encounters  Spiritual Needs Emotional;Grief support  Stress Factors  Patient Stress Factors Exhausted;Health changes;Loss of control;Major life changes  Family Stress Factors None identified

## 2019-07-03 DIAGNOSIS — G9340 Encephalopathy, unspecified: Secondary | ICD-10-CM

## 2019-07-03 DIAGNOSIS — I160 Hypertensive urgency: Secondary | ICD-10-CM | POA: Diagnosis present

## 2019-07-03 LAB — GLUCOSE, CAPILLARY
Glucose-Capillary: 84 mg/dL (ref 70–99)
Glucose-Capillary: 108 mg/dL — ABNORMAL HIGH (ref 70–99)

## 2019-07-03 LAB — CBC
HCT: 26.9 % — ABNORMAL LOW (ref 39.0–52.0)
Hemoglobin: 9.4 g/dL — ABNORMAL LOW (ref 13.0–17.0)
MCH: 32.1 pg (ref 26.0–34.0)
MCHC: 34.9 g/dL (ref 30.0–36.0)
MCV: 91.8 fL (ref 80.0–100.0)
Platelets: 195 10*3/uL (ref 150–400)
RBC: 2.93 MIL/uL — ABNORMAL LOW (ref 4.22–5.81)
RDW: 19.1 % — ABNORMAL HIGH (ref 11.5–15.5)
WBC: 3.7 10*3/uL — ABNORMAL LOW (ref 4.0–10.5)
nRBC: 0 % (ref 0.0–0.2)

## 2019-07-03 LAB — BASIC METABOLIC PANEL
Anion gap: 7 (ref 5–15)
BUN: 6 mg/dL — ABNORMAL LOW (ref 8–23)
CO2: 24 mmol/L (ref 22–32)
Calcium: 8.9 mg/dL (ref 8.9–10.3)
Chloride: 103 mmol/L (ref 98–111)
Creatinine, Ser: 0.43 mg/dL — ABNORMAL LOW (ref 0.61–1.24)
GFR calc Af Amer: >60 mL/min (ref >60)
GFR calc non Af Amer: >60 mL/min (ref >60)
Glucose, Bld: 87 mg/dL (ref 70–99)
Potassium: 3.9 mmol/L (ref 3.5–5.1)
Sodium: 134 mmol/L — ABNORMAL LOW (ref 135–145)

## 2019-07-03 LAB — HIV ANTIBODY (ROUTINE TESTING W REFLEX): HIV Screen 4th Generation wRfx: NONREACTIVE

## 2019-07-03 LAB — VITAMIN B12: Vitamin B-12: 392 pg/mL (ref 180–914)

## 2019-07-03 MED ORDER — ENSURE ENLIVE PO LIQD: 237.0000 mL | Freq: Three times a day (TID) | ORAL | 0 refills | Status: DC

## 2019-07-03 MED ORDER — GUAIFENESIN ER 600 MG PO TB12: 1200.0000 mg | ORAL_TABLET | Freq: Two times a day (BID) | ORAL | 0 refills | Status: AC

## 2019-07-03 MED ORDER — POTASSIUM CHLORIDE CRYS ER 20 MEQ PO TBCR
40.0000 meq | EXTENDED_RELEASE_TABLET | Freq: Once | ORAL | Status: AC
  Administered 2019-07-03: 40 meq via ORAL
  Filled 2019-07-03: qty 2

## 2019-07-03 MED ORDER — AZITHROMYCIN 250 MG PO TABS: 250.0000 mg | ORAL_TABLET | Freq: Every day | ORAL | 0 refills | Status: AC

## 2019-07-03 MED ORDER — FOLIC ACID 1 MG PO TABS: 1.0000 mg | ORAL_TABLET | Freq: Every day | ORAL | Status: DC

## 2019-07-03 MED ORDER — AMLODIPINE BESYLATE 2.5 MG PO TABS: 2.5000 mg | ORAL_TABLET | Freq: Every day | ORAL | 1 refills | Status: DC

## 2019-07-03 MED ORDER — TAB-A-VITE/IRON PO TABS: 1.0000 | ORAL_TABLET | Freq: Every day | ORAL | 0 refills | Status: DC

## 2019-07-03 MED ORDER — THIAMINE HCL 100 MG PO TABS: 100.0000 mg | ORAL_TABLET | Freq: Every day | ORAL | Status: DC

## 2019-07-03 MED ORDER — PANTOPRAZOLE SODIUM 40 MG PO TBEC: 40.0000 mg | DELAYED_RELEASE_TABLET | Freq: Every day | ORAL | 0 refills | Status: DC

## 2019-07-03 NOTE — Progress Notes (Signed)
  Speech Language Pathology Treatment: Dysphagia  Patient Details Name: Javier Robinson MRN: [id number] DOB: 05-08-1946 Today's Date: 07/03/2019 Time: 8916-9450 SLP Time Calculation (min) (ACUTE ONLY): 40 min  Assessment / Plan / Recommendation Clinical Impression  Pt seen for ongoing assessment of swallowing and trials to upgrade diet consistency if appropriate. Pt is more alert and awake; NSG said pt has not had any medications for agitation recently. Pt is sitting chair post PT session. Noted more verbal engagement intermittently; continued Cognitive decline requiring min verbal cues for follow through w/ tasks.  Pt consumed po trials w/ continued Audible swallows, almost a gulping sound. Overt coughing noted x1 w/ ~3-4 ozs of Nectar liquids. None noted w/ trials of Minced consistency foods given. Suspect delay in initiation of swallowing timing. Education given on swallowing/dysphagia and aspiration precautions including Smaller, single sips and f/u lingual sweeping and Dry swallow to clear any potential pharyngeal residue. No trials of thin liquids were attempted.  Pt would benefit from objective swallow assessment, MBSS, to determine nature of pt's swallow function and any deficits increasing his risk for aspiration. Recommend diet upgrade to Dysphagia level 2 (MINCED foods) but w/ Nectar liquids in place still. Strict aspiration precautions; PIlls in Puree for safer swallowing. ST services will f/u w/ MBSS on Monday. NSG updated.      HPI HPI: Pt is a 73 y.o. African-American male with a known history of ongoing tobacco and alcohol/ETOH abuse as well as seizure disorder, presented to the emergency room with acute onset of fall with subsequent inability to get up.  He stated that his girlfriend called EMS.  He was found in the floor with blood glucose of 37.  He denied any diaphoresis or jitteriness or loss of consciousness.  He denied any recent nausea or vomiting or diarrhea or abdominal pain.   He has been having cough with inability to expectorate.  Pt was admitted w/ low BS, Hyponatremia, low Hemoglobin/RBC, low HCT and Co2 as per labs.  Per CXR: "No focal airspace consolidation or pleural effusion. No acute disease".  Pt denied any difficulty swallowing w/ meals/liquids at home.  Per admitting MD note, bed insects were noted; precautions placed.       SLP Plan  Continue with current plan of care;Goals updated       Recommendations  Diet recommendations: Dysphagia 2 (fine chop);Nectar-thick liquid Liquids provided via: Cup;No straw Medication Administration: Whole meds with puree(for safer swallowing) Supervision: Staff to assist with self feeding;Full supervision/cueing for compensatory strategies Compensations: Minimize environmental distractions;Slow rate;Small sips/bites;Lingual sweep for clearance of pocketing;Follow solids with liquid;Multiple dry swallows after each bite/sip Postural Changes and/or Swallow Maneuvers: Seated upright 90 degrees;Upright 30-60 min after meal                General recommendations: (Dietician f/u) Oral Care Recommendations: Oral care BID;Oral care before and after PO;Staff/trained caregiver to provide oral care Follow up Recommendations: (TBD) SLP Visit Diagnosis: Dysphagia, oropharyngeal phase (R13.12)(declined Cognitive status; missing Dentition) Plan: Continue with current plan of care;Goals updated       [name], MS, CCC-SLP Javier Robinson 07/03/2019, 11:55 AM

## 2019-07-03 NOTE — Discharge Summary (Signed)
Physician Discharge Summary  Javier Robinson ULA:[id number] DOB: 11/10/1945 DOA: 06/30/2019  PCP: Derwood Kaplan, MD  Admit date: 06/30/2019 Discharge date: 07/03/2019  Time spent: 60 minutes  Recommendations for Outpatient Follow-up:  1. Follow-up with Derwood Kaplan, MD in 1 to 2 weeks.  On follow-up patient will need blood pressure reassessed.  Patient will need a basic metabolic profile done as well as a magnesium level done to follow-up on electrolytes and renal function.  RPR, vitamin B1, vitamin B12 levels, HIV were pending at time of discharge and will need to be followed up upon.  Patient also needs to be assessed for possible vascular dementia.   Discharge Diagnoses:  Principal Problem:   Hypoglycemia Active Problems:   SIRS (systemic inflammatory response syndrome) (HCC)   Hyponatremia   Alcohol abuse   Hypomagnesemia   Hypertensive urgency   Encephalopathy   Discharge Condition: Stable and improved  Diet recommendation: Dysphagia 2 diet with nectar thick liquids.  Filed Weights   07/01/19 0414 07/02/19 0354 07/03/19 0435  Weight: 71.5 kg 66.3 kg 71 kg    History of present illness:  HPI per Dr. Theodoro Parma Seitzinger  is a 73 y.o. African-American male with a known history of ongoing tobacco and alcohol abuse as well as seizure disorder, presented to the emergency room with acute onset of fall with subsequent inability to get up.  He stated that his girlfriend called EMS.  He was found in the floor with blood glucose of 37.  He denied any diaphoresis or jitteriness or loss of consciousness.  He denied any recent nausea or vomiting or diarrhea or abdominal pain.  He has been having cough with inability to expectorate.  No dyspnea or wheezing.  No fever or chills.  No recent exposure to COVID-19.  Denied any dysuria, oliguria or hematuria or flank pain.  He was given 250 mL of D10W as blood glucose was up to 237.  His sats on admission was 88% on room air.  He did not have any  injuries.  No chest pain or  palpitations.  No paresthesias or focal muscle weakness. The patient was also noted to have bed insets.  Contact precautions were placed.  When he came to the ER, blood pressure was 200/89 with pulse oximetry of 93% on room air, respiratory of 20 and later 24, temperature 96.9 with pulse of 92.  Labs were remarkable for hyponatremia of 129 with hypochloremia of 92, blood glucose of 151 anion gap of 16 with total bili 1.8 lactic acid 2 with high-sensitivity troponin I of 17 and mild anemia.  UA was negative.  Alcohol levels less than 10.  Urine drug screen came back negative.  He had 2 blood cultures sent.  Portable chest ray showed no acute cardiopulmonary disease.  COVID-19 test is currently pending  He was given IV vancomycin cefepime and Flagyl for suspected sepsis.  His blood glucoses come down to 128 and later 55 when he was started on D10W drip.  Will be admitted to a medical monitored bed for further evaluation and management.  Hospital Course:  1 hypoglycemia with generalized weakness and subsequent falls Questionable etiology.  Patient with no history of diabetes.  Hemoglobin A1c 5.0.  Concern for possible infectious etiology.  Patient pancultured with no growth to date.  Urinalysis was unremarkable.  Chest x-ray was negative.  Patient initially placed on D10W with improvement with hypoglycemia.  D10W was subsequently discontinued due to worsening hyponatremia.  Blood glucose levels  remained stable.  Patient initially placed empirically on broad-spectrum IV antibiotics which was subsequently narrowed down to oral azithromycin.  Patient was discharged home on 2 more days of oral azithromycin due to concern for possible bronchitis.  Outpatient follow-up with PCP.  2.  Systemic inflammatory response syndrome Patient on admission with hypoglycemia, fall, generalized weakness, noted to be hypothermic did have an elevated lactic acid level, cough and some tachypnea.   Patient pancultured.  Sputum Gram stain and cultures were ordered however not done likely due to nonproductive cough.  Blood cultures noted a contaminant of coagulase-negative staph.  Patient initially placed empirically on IV vancomycin, IV cefepime, IV Flagyl, IV azithromycin. Due to concern for cough and possibility of underlying bronchitis patient was maintained on azithromycin.  IV antibiotics of vancomycin, cefepime and Flagyl were subsequently discontinued.  Patient remained afebrile.  Patient improved clinically and patient be discharged home on 2 more days of oral azithromycin to complete a 5-day course of antibiotic treatment.  There was some concerns that patient did have some bed insects and contact precautions where placed.  However no bed insects noted per RN so far.    3.  Hypertensive urgency Blood pressure  noted to be somewhat elevated during the hospitalization.  Patient noted to be on Flomax prior to admission.  Patient was started on Norvasc 2.5 mg daily for better blood pressure control.  Blood pressure improved such that by day of discharge blood pressure was 134/79.  Outpatient follow-up with PCP.    4.  Hyponatremia Likely secondary to hypovolemic hyponatremia versus secondary to alcohol abuse.  TSH within normal limits.  Cortisol level within normal limits.  Urine osmolality was 652.  Urine sodium 151.  Urine creatinine 130.  Serum osmolality 268.  Patient initially was placed on D10 due to hypoglycemia with worsening of hyponatremia.  D10 discontinued patient placed on normal saline with improvement with hyponatremia.  Sodium level was up to 134 by day of discharge.  Outpatient follow-up.   5.  Hypomagnesemia Likely secondary to alcohol abuse.  Repleted.  6.  Ongoing tobacco abuse Tobacco cessation stressed to patient.  7.  Alcohol abuse Patient was placed on the Ativan CIWA protocol.  Patient remained stable.  Patient did not have any significant DTs during the  hospitalization.  Patient maintained on thiamine, folic acid, multivitamin.  Outpatient follow-up.    8.  BPH Continued on home regimen of Flomax.  9.  Encephalopathy, not otherwise specified Concern for possible vascular dementia versus secondary to ongoing chronic alcohol abuse.  Per patient's girlfriend patient noted to be having some bouts of confusion that have been ongoing.  Patient did not have any focal neurological deficits.  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  Neurology was curb sided who felt no further work-up needed at this time with close outpatient follow-up with PCP.  Procedures:  CT head 07/02/2019  Chest x-ray 06/30/2019  Consultations:  Curbside neurology  Discharge Exam: Vitals:   07/03/19 0435 07/03/19 0819  BP: (!) 156/80 134/79  Pulse: 70 72  Resp:    Temp: 98.4 F (36.9 C) 98 F (36.7 C)  SpO2: 99% 100%    General: NAD Cardiovascular: RRR Respiratory: CTAB  Discharge Instructions   Discharge Instructions    Diet general   Complete by: As  directed    Dysphagia 2 diet with nectar thick liquids.   Increase activity slowly   Complete by: As directed      Allergies as of 07/03/2019   No Known Allergies     Medication List    TAKE these medications   amLODipine 2.5 MG tablet Commonly known as: NORVASC Take 1 tablet (2.5 mg total) by mouth daily. Start taking on: July 04, 2019   azithromycin 250 MG tablet Commonly known as: ZITHROMAX Take 1 tablet (250 mg total) by mouth daily for 2 days. Start taking on: July 04, 2019   feeding supplement (ENSURE ENLIVE) Liqd Take 237 mLs by mouth 3 (three) times daily between meals.   folic acid 1 MG tablet Commonly known as: FOLVITE Take 1 tablet (1 mg total) by  mouth daily. Start taking on: July 04, 2019   guaiFENesin 600 MG 12 hr tablet Commonly known as: MUCINEX Take 2 tablets (1,200 mg total) by mouth 2 (two) times daily for 3 days.   meloxicam 15 MG tablet Commonly known as: MOBIC Take 15 mg by mouth daily after breakfast.   multivitamins with iron Tabs tablet Take 1 tablet by mouth daily. Start taking on: July 04, 2019   pantoprazole 40 MG tablet Commonly known as: PROTONIX Take 1 tablet (40 mg total) by mouth daily at 6 (six) AM. Start taking on: July 04, 2019   prednisoLONE acetate 1 % ophthalmic suspension Commonly known as: PRED FORTE Place 1 drop into the left eye 3 (three) times daily.   thiamine 100 MG tablet Take 1 tablet (100 mg total) by mouth daily. Start taking on: July 04, 2019      No Known Allergies Follow-up Information    Derwood Kaplan, MD. Schedule an appointment as soon as possible for a visit in 2 week(s).   Specialty: Internal Medicine Why: f/u in 1-2 weeks. Contact information: [address] [place] [address] [phone]            The results of significant diagnostics from this hospitalization (including imaging, microbiology, ancillary and laboratory) are listed below for reference.    Significant Diagnostic Studies: Ct Head Wo Contrast  Result Date: 07/02/2019 CLINICAL DATA:  Acute confusion. EXAM: CT HEAD WITHOUT CONTRAST TECHNIQUE: Contiguous axial images were obtained from the base of the skull through the vertex without intravenous contrast. COMPARISON:  CT dated February 18, 2014 FINDINGS: Brain: No evidence of acute infarction, hemorrhage, hydrocephalus, extra-axial collection or mass lesion/mass effect. There is a lacunar infarct measuring approximately 1.9 cm in the left basal ganglia. This is age indeterminate but is new since February 18, 2014 CT head. Atrophy and chronic microvascular ischemic changes are noted which have progressed since 2015. Vascular: No  hyperdense vessel or unexpected calcification. Skull: Normal. Negative for fracture or focal lesion. Sinuses/Orbits: No acute finding. Other: None. IMPRESSION: 1. 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 2. Atrophy and chronic microvascular ischemic changes have progressed since 2015. 3. No definite acute intracranial abnormality on this exam. Electronically Signed   By: Katherine Mantle M.D.   On: 07/02/2019 19:24   Dg Chest Port 1 View  Result Date: 06/30/2019 CLINICAL DATA:  Hypoxia EXAM: PORTABLE CHEST 1 VIEW COMPARISON:  February 18, 2014 FINDINGS: There is mild prominence to the cardiac silhouette. The lungs are clear. No focal airspace consolidation or pleural effusion. No acute osseous abnormality. IMPRESSION: No active disease. Electronically Signed   By: Kandis Fantasia  Avutu M.D.   On: 06/30/2019 01:57    Microbiology: Recent Results (from the past 240 hour(s))  SARS CORONAVIRUS 2 (TAT 6-24 HRS) Nasopharyngeal Nasopharyngeal Swab     Status: None   Collection Time: 06/30/19  1:43 AM   Specimen: Nasopharyngeal Swab  Result Value Ref Range Status   SARS Coronavirus 2 NEGATIVE NEGATIVE Final    Comment: (NOTE) SARS-CoV-2 target nucleic acids are NOT DETECTED. The SARS-CoV-2 RNA is generally detectable in upper and lower respiratory specimens during the acute phase of infection. Negative results do not preclude SARS-CoV-2 infection, do not rule out co-infections with other pathogens, and should not be used as the sole basis for treatment or other patient management decisions. Negative results must be combined with clinical observations, patient history, and epidemiological information. The expected result is Negative. Fact Sheet for Patients: HairSlick.nohttps://www.fda.gov/media/138098/download Fact Sheet for Healthcare Providers: quierodirigir.comhttps://www.fda.gov/media/138095/download This test is not yet approved or cleared by the [place] FDA and  has been  authorized for detection and/or diagnosis of SARS-CoV-2 by FDA under an Emergency Use Authorization (EUA). This EUA will remain  in effect (meaning this test can be used) for the duration of the COVID-19 declaration under Section 56 4(b)(1) of the Act, 21 U.S.C. section 360bbb-3(b)(1), unless the authorization is terminated or revoked sooner. Performed at [facility], [address] [place], [place] [address]   Culture, blood (routine x 2)     Status: None (Preliminary result)   Collection Time: 06/30/19  1:58 AM   Specimen: BLOOD LEFT FOREARM  Result Value Ref Range Status   Specimen Description BLOOD LEFT FOREARM  Final   Special Requests   Final    BOTTLES DRAWN AEROBIC AND ANAEROBIC Blood Culture adequate volume   Culture   Final    NO GROWTH 3 DAYS Performed at [facility], [address] [place], [place] [address]    Report Status PENDING  Incomplete  Culture, blood (routine x 2)     Status: Abnormal   Collection Time: 06/30/19  1:58 AM   Specimen: BLOOD  Result Value Ref Range Status   Specimen Description   Final    BLOOD RIGHT ARM Performed at [facility], [address] [place], [place] [address]    Special Requests   Final    BOTTLES DRAWN AEROBIC AND ANAEROBIC Blood Culture results may not be optimal due to an excessive volume of blood received in culture bottles Performed at [facility], [address] [place], [place] [address]    Culture  Setup Time   Final    GRAM POSITIVE COCCI ANAEROBIC BOTTLE ONLY CRITICAL RESULT CALLED TO, READ BACK BY AND VERIFIED WITH: CH[facility]IS [facility] 06/30/2019 1833 KMP Performed at [facility], [address] [place], [place] [address]    Culture (A)  Final    STAPHYLOCOCCUS SPECIES (COAGULASE NEGATIVE) THE SIGNIFICANCE OF ISOLATING THIS ORGANISM FROM A SINGLE SET OF BLOOD CULTURES WHEN MULTIPLE SETS ARE DRAWN IS UNCERTAIN. PLEASE NOTIFY THE MICROBIOLOGY DEPARTMENT WITHIN ONE WEEK IF  SPECIATION AND SENSITIVITIES ARE REQUIRED. Performed at [facility], [address] [place], [place] [address]    Report Status 07/02/2019 FINAL  Final  MRSA PCR Screening     Status: None   Collection Time: 06/30/19  9:47 AM   Specimen: Nasal Mucosa; Nasopharyngeal  Result Value Ref Range Status   MRSA by PCR NEGATIVE NEGATIVE Final    Comment:        The  GeneXpert MRSA Assay (FDA approved for NASAL specimens only), is one component of a comprehensive MRSA colonization surveillance program. It is not intended to diagnose MRSA infection nor to guide or monitor treatment for MRSA infections. Performed at [facility], [address] [place], [place] [address]      Labs: Basic Metabolic Panel: Recent Labs  Lab 06/30/19 [phone] 06/30/19 0931 06/30/19 1445 07/01/19 0412 07/02/19 0658 07/03/19 0642  NA 128*  --  122* 127* 133* 134*  K 3.6  --  3.7 3.6 3.6 3.9  CL 93*  --  88* 95* 101 103  CO2 20*  --  21* 24 24 24   GLUCOSE 93  --  237* 103* 91 87  BUN 6*  --  9 6* 5* 6*  CREATININE 0.49*  --  0.64 0.38* 0.38* 0.43*  CALCIUM 8.7*  --  8.6* 8.7* 8.8* 8.9  MG  --  1.6*  --  2.2  --   --   PHOS  --  2.4*  --   --   --   --    Liver Function Tests: Recent Labs  Lab 06/30/19 0143  AST 35  ALT 16  ALKPHOS 76  BILITOT 1.8*  PROT 8.6*  ALBUMIN 3.5   No results for input(s): LIPASE, AMYLASE in the last 168 hours. No results for input(s): AMMONIA in the last 168 hours. CBC: Recent Labs  Lab 06/30/19 0143 06/30/19 0626 07/01/19 0412 07/02/19 0658 07/03/19 0642  WBC 4.8 5.4 4.5 4.2 3.7*  NEUTROABS 3.1  --   --   --   --   HGB 11.4* 11.0* 10.8* 9.6* 9.4*  HCT 34.8* 33.0* 30.1* 26.9* 26.9*  MCV 96.9 94.8 89.6 89.4 91.8  PLT 234 240 225 201 195   Cardiac Enzymes: No results for input(s): CKTOTAL, CKMB, CKMBINDEX, TROPONINI in the last 168 hours. BNP: BNP (last 3 results) No results for input(s): BNP in the last 8760 hours.  ProBNP (last 3  results) No results for input(s): PROBNP in the last 8760 hours.  CBG: Recent Labs  Lab 07/02/19 1204 07/02/19 1612 07/02/19 2058 07/03/19 0820 07/03/19 1137  GLUCAP 87 86 103* 84 108*       Signed:  Irine Seal MD.  [facility] 07/03/2019, 2:14 PM

## 2019-07-03 NOTE — Progress Notes (Signed)
Physical Therapy Treatment Patient Details Name: Javier Robinson MRN: [id number] DOB: March 24, 1946 Today's Date: 07/03/2019    History of Present Illness pt is a 73 yo male admitted after fall at home and not being able to get up, blood glucose upon EMS arrival 37. PMH includes ongoing tobacco and alcohol abuse, seizure disorder    PT Comments    Pt ready to get up this am.  RN in room stating pt is more alert this am.  To edge of bed with min a x 1.  Stands with min/mod a x 1 and requires mod a x 1 to maintain balance and prevent post LOB today.  While he is able to walk to/from door with RW he never truly gains his balance today and requires mod a x 1 to prevent fall.  Pt is aware of his need for assist and that he would fall without outside assist despite walker.  He remains in recliner with breakfast tray.  Initial recommendations for HHPT noted.  Per RN, pt required meds yesterday and he slept most of the day.  Pt required less assist at eval.  Anticipate improving mobility during stay but if pt does not improve with balance and assist level he may need to consider SNF to return to baseline upon discharge.  Will continue to monitor and adjust recommendations as needed.   Follow Up Recommendations  Home health PT;Supervision for mobility/OOB  - see above.     Equipment Recommendations  Rolling walker with 5" wheels    Recommendations for Other Services       Precautions / Restrictions Precautions Precautions: Fall Restrictions Weight Bearing Restrictions: No    Mobility  Bed Mobility Overal bed mobility: Needs Assistance Bed Mobility: Supine to Sit     Supine to sit: Min assist        Transfers Overall transfer level: Needs assistance Equipment used: Rolling walker (2 wheeled) Transfers: Sit to/from Stand Sit to Stand: Min assist;Mod assist            Ambulation/Gait Ambulation/Gait assistance: Mod assist;Min assist Gait Distance (Feet): 30 Feet Assistive  device: Rolling walker (2 wheeled) Gait Pattern/deviations: Step-through pattern;Decreased step length - right;Decreased step length - left;Trunk flexed Gait velocity: decreased   General Gait Details: mod assist due to post lean which would result in fall if outside support not given despite walker   Stairs             Wheelchair Mobility    Modified Rankin (Stroke Patients Only)       Balance Overall balance assessment: Needs assistance Sitting-balance support: Feet supported Sitting balance-Leahy Scale: Fair     Standing balance support: Bilateral upper extremity supported Standing balance-Leahy Scale: Poor Standing balance comment: requried mod a x 1 at all times to prevent fall today                            Cognition Arousal/Alertness: Awake/alert Behavior During Therapy: WFL for tasks assessed/performed Overall Cognitive Status: No family/caregiver present to determine baseline cognitive functioning                                        Exercises      General Comments        Pertinent Vitals/Pain Pain Assessment: No/denies pain    Home Living  Prior Function            PT Goals (current goals can now be found in the care plan section) Progress towards PT goals: Progressing toward goals    Frequency    Min 2X/week      PT Plan Current plan remains appropriate;Other (comment)    Co-evaluation              AM-PAC PT "6 Clicks" Mobility   Outcome Measure  Help needed turning from your back to your side while in a flat bed without using bedrails?: A Little Help needed moving from lying on your back to sitting on the side of a flat bed without using bedrails?: A Little Help needed moving to and from a bed to a chair (including a wheelchair)?: A Lot Help needed standing up from a chair using your arms (e.g., wheelchair or bedside chair)?: A Lot Help needed to walk in hospital  room?: A Lot Help needed climbing 3-5 steps with a railing? : A Lot 6 Click Score: 14    End of Session Equipment Utilized During Treatment: Gait belt Activity Tolerance: Patient tolerated treatment well Patient left: in chair;with call bell/phone within reach;with chair alarm set Nurse Communication: Mobility status       Time: 0941-1000 PT Time Calculation (min) (ACUTE ONLY): 19 min  Charges:  $Gait Training: 8-22 mins                     Chesley Noon, PTA 07/03/19, 12:15 PM

## 2019-07-03 NOTE — Progress Notes (Signed)
Went over discharge instructions with the patient's significant other including medications and follow-up appointment. Discontinue PIV and telemetry monitor. Message Josh CM about home health if this is all set and he said patient is all set.

## 2019-07-04 LAB — RPR: RPR Ser Ql: NONREACTIVE

## 2019-07-04 NOTE — TOC Transition Note (Signed)
Transition of Care [facility]) - CM/SW Discharge Note   Patient Details  Name: Javier Robinson MRN: [id number] Date of Birth: Jul 23, 1946  Transition of Care [facility]) CM/SW Contact:  Latanya Maudlin, RN Phone Number: 07/04/2019, 7:58 AM   Clinical Narrative:  [facility] team was consulted for home health needs. Initial assessment was completed however no referral was placed for home health and patient declined alcohol abuse resources. After chart review this RNCM attempted to visit the patient and offer choice but he had been discharged. Voicemail left to assist with home health needs.       Final next level of care: [place] Barriers to Discharge: Continued Medical Work up   Patient Goals and CMS Choice Patient states their goals for this hospitalization and ongoing recovery are:: To return home with home health. CMS Medicare.gov Compare Post Acute Care list provided to:: Patient Choice offered to / list presented to : Patient  Discharge Placement                       Discharge Plan and Services     Post Acute Care Choice: Home Health          DME Arranged: N/A DME Agency: NA                  Social Determinants of Health (SDOH) Interventions     Readmission Risk Interventions No flowsheet data found.

## 2019-07-05 LAB — CULTURE, BLOOD (ROUTINE X 2)
Culture: NO GROWTH
Special Requests: ADEQUATE

## 2019-07-07 LAB — VITAMIN B1: Vitamin B1 (Thiamine): 88.4 nmol/L (ref 66.5–200.0)

## 2019-08-09 ENCOUNTER — Inpatient Hospital Stay
Admission: EM | Admit: 2019-08-09 | Discharge: 2019-08-18 | DRG: 640 | Disposition: A | Discharge status: Home Health | Payer: Medicare Other | Attending: Internal Medicine | Admitting: Internal Medicine

## 2019-08-09 ENCOUNTER — Emergency Department: Payer: Medicare Other

## 2019-08-09 ENCOUNTER — Encounter: Payer: Self-pay | Admitting: Medical Oncology

## 2019-08-09 ENCOUNTER — Other Ambulatory Visit: Payer: Self-pay

## 2019-08-09 DIAGNOSIS — Z79899 Other long term (current) drug therapy: Secondary | ICD-10-CM

## 2019-08-09 DIAGNOSIS — Z6823 Body mass index (BMI) 23.0-23.9, adult: Secondary | ICD-10-CM

## 2019-08-09 DIAGNOSIS — I4581 Long QT syndrome: Secondary | ICD-10-CM | POA: Diagnosis present

## 2019-08-09 DIAGNOSIS — Z791 Long term (current) use of non-steroidal anti-inflammatories (NSAID): Secondary | ICD-10-CM

## 2019-08-09 DIAGNOSIS — R9431 Abnormal electrocardiogram [ECG] [EKG]: Secondary | ICD-10-CM

## 2019-08-09 DIAGNOSIS — K709 Alcoholic liver disease, unspecified: Secondary | ICD-10-CM | POA: Diagnosis present

## 2019-08-09 DIAGNOSIS — E872 Acidosis: Secondary | ICD-10-CM | POA: Diagnosis present

## 2019-08-09 DIAGNOSIS — E8729 Other acidosis: Secondary | ICD-10-CM | POA: Diagnosis present

## 2019-08-09 DIAGNOSIS — G9341 Metabolic encephalopathy: Secondary | ICD-10-CM | POA: Diagnosis present

## 2019-08-09 DIAGNOSIS — E878 Other disorders of electrolyte and fluid balance, not elsewhere classified: Secondary | ICD-10-CM | POA: Diagnosis present

## 2019-08-09 DIAGNOSIS — I10 Essential (primary) hypertension: Secondary | ICD-10-CM | POA: Diagnosis present

## 2019-08-09 DIAGNOSIS — R0602 Shortness of breath: Secondary | ICD-10-CM

## 2019-08-09 DIAGNOSIS — E162 Hypoglycemia, unspecified: Secondary | ICD-10-CM | POA: Diagnosis not present

## 2019-08-09 DIAGNOSIS — I272 Pulmonary hypertension, unspecified: Secondary | ICD-10-CM | POA: Diagnosis present

## 2019-08-09 DIAGNOSIS — E871 Hypo-osmolality and hyponatremia: Principal | ICD-10-CM | POA: Diagnosis present

## 2019-08-09 DIAGNOSIS — Z20828 Contact with and (suspected) exposure to other viral communicable diseases: Secondary | ICD-10-CM | POA: Diagnosis present

## 2019-08-09 DIAGNOSIS — E440 Moderate protein-calorie malnutrition: Secondary | ICD-10-CM | POA: Insufficient documentation

## 2019-08-09 DIAGNOSIS — G9340 Encephalopathy, unspecified: Secondary | ICD-10-CM

## 2019-08-09 DIAGNOSIS — I5032 Chronic diastolic (congestive) heart failure: Secondary | ICD-10-CM | POA: Diagnosis present

## 2019-08-09 DIAGNOSIS — Z8669 Personal history of other diseases of the nervous system and sense organs: Secondary | ICD-10-CM

## 2019-08-09 DIAGNOSIS — I4891 Unspecified atrial fibrillation: Secondary | ICD-10-CM | POA: Diagnosis present

## 2019-08-09 DIAGNOSIS — F10239 Alcohol dependence with withdrawal, unspecified: Secondary | ICD-10-CM | POA: Diagnosis not present

## 2019-08-09 DIAGNOSIS — E861 Hypovolemia: Secondary | ICD-10-CM | POA: Diagnosis present

## 2019-08-09 DIAGNOSIS — R443 Hallucinations, unspecified: Secondary | ICD-10-CM | POA: Diagnosis not present

## 2019-08-09 DIAGNOSIS — F1020 Alcohol dependence, uncomplicated: Secondary | ICD-10-CM | POA: Diagnosis present

## 2019-08-09 DIAGNOSIS — R197 Diarrhea, unspecified: Secondary | ICD-10-CM | POA: Diagnosis present

## 2019-08-09 DIAGNOSIS — I110 Hypertensive heart disease with heart failure: Secondary | ICD-10-CM | POA: Diagnosis present

## 2019-08-09 DIAGNOSIS — J209 Acute bronchitis, unspecified: Secondary | ICD-10-CM | POA: Diagnosis not present

## 2019-08-09 DIAGNOSIS — R062 Wheezing: Secondary | ICD-10-CM

## 2019-08-09 LAB — COMPREHENSIVE METABOLIC PANEL
ALT: 8 U/L (ref 0–44)
ALT: 9 U/L (ref 0–44)
AST: 24 U/L (ref 15–41)
AST: 22 U/L (ref 15–41)
Albumin: 3.7 g/dL (ref 3.5–5.0)
Albumin: 3.5 g/dL (ref 3.5–5.0)
Alkaline Phosphatase: 65 U/L (ref 38–126)
Alkaline Phosphatase: 65 U/L (ref 38–126)
Anion gap: 18 — ABNORMAL HIGH (ref 5–15)
Anion gap: 15 (ref 5–15)
BUN: 8 mg/dL (ref 8–23)
BUN: 8 mg/dL (ref 8–23)
CO2: 21 mmol/L — ABNORMAL LOW (ref 22–32)
CO2: 22 mmol/L (ref 22–32)
Calcium: 9.2 mg/dL (ref 8.9–10.3)
Calcium: 8.9 mg/dL (ref 8.9–10.3)
Chloride: 93 mmol/L — ABNORMAL LOW (ref 98–111)
Chloride: 92 mmol/L — ABNORMAL LOW (ref 98–111)
Creatinine, Ser: 0.61 mg/dL (ref 0.61–1.24)
Creatinine, Ser: 0.58 mg/dL — ABNORMAL LOW (ref 0.61–1.24)
GFR calc Af Amer: >60 mL/min (ref >60)
GFR calc Af Amer: >60 mL/min (ref >60)
GFR calc non Af Amer: >60 mL/min (ref >60)
GFR calc non Af Amer: >60 mL/min (ref >60)
Glucose, Bld: 74 mg/dL (ref 70–99)
Glucose, Bld: 135 mg/dL — ABNORMAL HIGH (ref 70–99)
Potassium: 3.7 mmol/L (ref 3.5–5.1)
Potassium: 3.6 mmol/L (ref 3.5–5.1)
Sodium: 132 mmol/L — ABNORMAL LOW (ref 135–145)
Sodium: 129 mmol/L — ABNORMAL LOW (ref 135–145)
Total Bilirubin: 1.4 mg/dL — ABNORMAL HIGH (ref 0.3–1.2)
Total Bilirubin: 1.3 mg/dL — ABNORMAL HIGH (ref 0.3–1.2)
Total Protein: 8.5 g/dL — ABNORMAL HIGH (ref 6.5–8.1)
Total Protein: 7.9 g/dL (ref 6.5–8.1)

## 2019-08-09 LAB — CBC WITH DIFFERENTIAL/PLATELET
Abs Immature Granulocytes: 0.02 10*3/uL (ref 0.00–0.07)
Basophils Absolute: 0.1 10*3/uL (ref 0.0–0.1)
Basophils Relative: 1 %
Eosinophils Absolute: 0 10*3/uL (ref 0.0–0.5)
Eosinophils Relative: 0 %
HCT: 36.4 % — ABNORMAL LOW (ref 39.0–52.0)
Hemoglobin: 11.9 g/dL — ABNORMAL LOW (ref 13.0–17.0)
Immature Granulocytes: 0 %
Lymphocytes Relative: 20 %
Lymphs Abs: 1.2 10*3/uL (ref 0.7–4.0)
MCH: 31.9 pg (ref 26.0–34.0)
MCHC: 32.7 g/dL (ref 30.0–36.0)
MCV: 97.6 fL (ref 80.0–100.0)
Monocytes Absolute: 0.5 10*3/uL (ref 0.1–1.0)
Monocytes Relative: 8 %
Neutro Abs: 4.4 10*3/uL (ref 1.7–7.7)
Neutrophils Relative %: 71 %
Platelets: 207 10*3/uL (ref 150–400)
RBC: 3.73 MIL/uL — ABNORMAL LOW (ref 4.22–5.81)
RDW: 18.2 % — ABNORMAL HIGH (ref 11.5–15.5)
WBC: 6.2 10*3/uL (ref 4.0–10.5)
nRBC: 0 % (ref 0.0–0.2)

## 2019-08-09 LAB — CBC
HCT: 36.4 % — ABNORMAL LOW (ref 39.0–52.0)
Hemoglobin: 12 g/dL — ABNORMAL LOW (ref 13.0–17.0)
MCH: 32.3 pg (ref 26.0–34.0)
MCHC: 33 g/dL (ref 30.0–36.0)
MCV: 98.1 fL (ref 80.0–100.0)
Platelets: 203 10*3/uL (ref 150–400)
RBC: 3.71 MIL/uL — ABNORMAL LOW (ref 4.22–5.81)
RDW: 18.1 % — ABNORMAL HIGH (ref 11.5–15.5)
WBC: 6.5 10*3/uL (ref 4.0–10.5)
nRBC: 0 % (ref 0.0–0.2)

## 2019-08-09 LAB — MAGNESIUM: Magnesium: 1.8 mg/dL (ref 1.7–2.4)

## 2019-08-09 LAB — URINALYSIS, COMPLETE (UACMP) WITH MICROSCOPIC
Bacteria, UA: NONE SEEN
Bilirubin Urine: NEGATIVE
Glucose, UA: 150 mg/dL — AB
Hgb urine dipstick: NEGATIVE
Ketones, ur: 80 mg/dL — AB
Leukocytes,Ua: NEGATIVE
Nitrite: NEGATIVE
Protein, ur: NEGATIVE mg/dL
Specific Gravity, Urine: 1.018 (ref 1.005–1.030)
pH: 6 (ref 5.0–8.0)

## 2019-08-09 LAB — GLUCOSE, CAPILLARY
Glucose-Capillary: 82 mg/dL (ref 70–99)
Glucose-Capillary: 66 mg/dL — ABNORMAL LOW (ref 70–99)
Glucose-Capillary: 150 mg/dL — ABNORMAL HIGH (ref 70–99)
Glucose-Capillary: 77 mg/dL (ref 70–99)

## 2019-08-09 LAB — ETHANOL: Alcohol, Ethyl (B): <10 mg/dL (ref <10)

## 2019-08-09 LAB — TROPONIN I (HIGH SENSITIVITY)
Troponin I (High Sensitivity): 21 ng/L — ABNORMAL HIGH (ref <18)
Troponin I (High Sensitivity): 22 ng/L — ABNORMAL HIGH (ref <18)

## 2019-08-09 LAB — LACTIC ACID, PLASMA
Lactic Acid, Venous: 1.5 mmol/L (ref 0.5–1.9)
Lactic Acid, Venous: 1.7 mmol/L (ref 0.5–1.9)

## 2019-08-09 LAB — PHOSPHORUS: Phosphorus: 2.9 mg/dL (ref 2.5–4.6)

## 2019-08-09 LAB — CK: Total CK: 103 U/L (ref 49–397)

## 2019-08-09 LAB — SALICYLATE LEVEL: Salicylate Lvl: <7 mg/dL (ref 2.8–30.0)

## 2019-08-09 MED ORDER — ADULT MULTIVITAMIN W/MINERALS CH
1.0000 | ORAL_TABLET | Freq: Every day | ORAL | Status: DC
  Administered 2019-08-10 – 2019-08-18 (×9): 1 via ORAL
  Filled 2019-08-09 (×9): qty 1

## 2019-08-09 MED ORDER — THIAMINE HCL 100 MG/ML IJ SOLN
Freq: Once | INTRAVENOUS | Status: AC
  Filled 2019-08-09: qty 1000

## 2019-08-09 MED ORDER — ENOXAPARIN SODIUM 40 MG/0.4ML ~~LOC~~ SOLN
40.0000 mg | SUBCUTANEOUS | Status: DC
  Administered 2019-08-10 – 2019-08-17 (×8): 40 mg via SUBCUTANEOUS
  Filled 2019-08-09 (×8): qty 0.4

## 2019-08-09 MED ORDER — ONDANSETRON HCL 4 MG PO TABS: 4.0000 mg | ORAL_TABLET | Freq: Four times a day (QID) | ORAL | Status: DC | PRN

## 2019-08-09 MED ORDER — INSULIN ASPART 100 UNIT/ML ~~LOC~~ SOLN: 0.0000 [IU] | Freq: Three times a day (TID) | SUBCUTANEOUS | Status: DC

## 2019-08-09 MED ORDER — LABETALOL HCL 5 MG/ML IV SOLN: 5.0000 mg | Freq: Once | INTRAVENOUS | Status: DC

## 2019-08-09 MED ORDER — DEXTROSE-NACL 5-0.45 % IV SOLN: INTRAVENOUS | Status: DC

## 2019-08-09 MED ORDER — LORAZEPAM 2 MG/ML IJ SOLN
1.0000 mg | INTRAMUSCULAR | Status: AC | PRN
  Administered 2019-08-11: 2 mg via INTRAVENOUS
  Filled 2019-08-09: qty 1

## 2019-08-09 MED ORDER — LORAZEPAM 1 MG PO TABS: 1.0000 mg | ORAL_TABLET | ORAL | Status: AC | PRN

## 2019-08-09 MED ORDER — THIAMINE HCL 100 MG PO TABS
100.0000 mg | ORAL_TABLET | Freq: Every day | ORAL | Status: DC
  Administered 2019-08-10 – 2019-08-12 (×3): 100 mg via ORAL
  Filled 2019-08-09 (×3): qty 1

## 2019-08-09 MED ORDER — SODIUM CHLORIDE 0.9 % IV BOLUS
500.0000 mL | Freq: Once | INTRAVENOUS | Status: AC
  Administered 2019-08-09: 500 mL via INTRAVENOUS

## 2019-08-09 MED ORDER — BISACODYL 5 MG PO TBEC: 5.0000 mg | DELAYED_RELEASE_TABLET | Freq: Every day | ORAL | Status: DC | PRN

## 2019-08-09 MED ORDER — ACETAMINOPHEN 325 MG PO TABS: 650.0000 mg | ORAL_TABLET | Freq: Four times a day (QID) | ORAL | Status: DC | PRN

## 2019-08-09 MED ORDER — AMLODIPINE BESYLATE 5 MG PO TABS
5.0000 mg | ORAL_TABLET | Freq: Every day | ORAL | Status: DC
  Administered 2019-08-09 – 2019-08-12 (×4): 5 mg via ORAL
  Filled 2019-08-09 (×4): qty 1

## 2019-08-09 MED ORDER — POLYETHYLENE GLYCOL 3350 17 G PO PACK: 17.0000 g | PACK | Freq: Every day | ORAL | Status: DC | PRN

## 2019-08-09 MED ORDER — FOLIC ACID 1 MG PO TABS
1.0000 mg | ORAL_TABLET | Freq: Every day | ORAL | Status: DC
  Administered 2019-08-10 – 2019-08-18 (×9): 1 mg via ORAL
  Filled 2019-08-09 (×9): qty 1

## 2019-08-09 MED ORDER — SODIUM CHLORIDE 0.9 % IV BOLUS: 1000.0000 mL | Freq: Once | INTRAVENOUS | Status: DC

## 2019-08-09 MED ORDER — MAGNESIUM CITRATE PO SOLN
1.0000 | Freq: Once | ORAL | Status: AC | PRN
  Administered 2019-08-10: 1 via ORAL
  Filled 2019-08-09: qty 296

## 2019-08-09 MED ORDER — LABETALOL HCL 5 MG/ML IV SOLN: 10.0000 mg | INTRAVENOUS | Status: DC | PRN

## 2019-08-09 MED ORDER — DEXTROSE-NACL 5-0.9 % IV SOLN: INTRAVENOUS | Status: DC

## 2019-08-09 MED ORDER — ACETAMINOPHEN 650 MG RE SUPP: 650.0000 mg | Freq: Four times a day (QID) | RECTAL | Status: DC | PRN

## 2019-08-09 MED ORDER — DEXTROSE 50 % IV SOLN
1.0000 | Freq: Once | INTRAVENOUS | Status: AC
  Administered 2019-08-09: 50 mL via INTRAVENOUS
  Filled 2019-08-09: qty 50

## 2019-08-09 MED ORDER — ONDANSETRON HCL 4 MG/2ML IJ SOLN: 4.0000 mg | Freq: Four times a day (QID) | INTRAMUSCULAR | Status: DC | PRN

## 2019-08-09 NOTE — ED Notes (Signed)
PT back to [facility] with occasional PVC. NP Randol Kern aware.
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
## 2019-08-09 NOTE — ED Triage Notes (Signed)
Pt from home via ems reports of AMS- cbg when ems arrived was 25. IV dextrose was administered pta, cbg upon arrival to ed was 82. Pt denies pain. A/O x 3. Denies pain. 90% on RA, placed on 2L [place]-96%.

## 2019-08-09 NOTE — ED Notes (Signed)
This Rn attempted to call friend on file [place] [phone]. Individual on the phone st "wrong number". This RN will attempt to find a family member for this pt.
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
## 2019-08-09 NOTE — ED Notes (Signed)
This RN spoke with Max Fickle [phone] updatde family.

## 2019-08-09 NOTE — ED Notes (Signed)
Patient transported to CT 

## 2019-08-10 ENCOUNTER — Observation Stay
Admit: 2019-08-10 | Discharge: 2019-08-10 | Disposition: A | Discharge status: Home / Self Care | Payer: Medicare Other | Attending: Internal Medicine | Admitting: Internal Medicine

## 2019-08-10 ENCOUNTER — Observation Stay: Admit: 2019-08-10 | Payer: Medicare Other

## 2019-08-10 DIAGNOSIS — I110 Hypertensive heart disease with heart failure: Secondary | ICD-10-CM | POA: Diagnosis present

## 2019-08-10 DIAGNOSIS — Z6823 Body mass index (BMI) 23.0-23.9, adult: Secondary | ICD-10-CM | POA: Diagnosis not present

## 2019-08-10 DIAGNOSIS — Z79899 Other long term (current) drug therapy: Secondary | ICD-10-CM | POA: Diagnosis not present

## 2019-08-10 DIAGNOSIS — I4581 Long QT syndrome: Secondary | ICD-10-CM | POA: Diagnosis present

## 2019-08-10 DIAGNOSIS — F1029 Alcohol dependence with unspecified alcohol-induced disorder: Secondary | ICD-10-CM | POA: Diagnosis not present

## 2019-08-10 DIAGNOSIS — G9341 Metabolic encephalopathy: Secondary | ICD-10-CM | POA: Diagnosis present

## 2019-08-10 DIAGNOSIS — I4891 Unspecified atrial fibrillation: Secondary | ICD-10-CM | POA: Diagnosis present

## 2019-08-10 DIAGNOSIS — Z8669 Personal history of other diseases of the nervous system and sense organs: Secondary | ICD-10-CM | POA: Diagnosis not present

## 2019-08-10 DIAGNOSIS — K709 Alcoholic liver disease, unspecified: Secondary | ICD-10-CM | POA: Diagnosis present

## 2019-08-10 DIAGNOSIS — F10239 Alcohol dependence with withdrawal, unspecified: Secondary | ICD-10-CM | POA: Diagnosis not present

## 2019-08-10 DIAGNOSIS — E440 Moderate protein-calorie malnutrition: Secondary | ICD-10-CM | POA: Diagnosis present

## 2019-08-10 DIAGNOSIS — R443 Hallucinations, unspecified: Secondary | ICD-10-CM | POA: Diagnosis not present

## 2019-08-10 DIAGNOSIS — R9431 Abnormal electrocardiogram [ECG] [EKG]: Secondary | ICD-10-CM | POA: Diagnosis not present

## 2019-08-10 DIAGNOSIS — E871 Hypo-osmolality and hyponatremia: Secondary | ICD-10-CM | POA: Diagnosis present

## 2019-08-10 DIAGNOSIS — E872 Acidosis: Secondary | ICD-10-CM | POA: Diagnosis present

## 2019-08-10 DIAGNOSIS — G9340 Encephalopathy, unspecified: Secondary | ICD-10-CM | POA: Diagnosis not present

## 2019-08-10 DIAGNOSIS — I10 Essential (primary) hypertension: Secondary | ICD-10-CM | POA: Diagnosis not present

## 2019-08-10 DIAGNOSIS — I5032 Chronic diastolic (congestive) heart failure: Secondary | ICD-10-CM | POA: Diagnosis present

## 2019-08-10 DIAGNOSIS — J209 Acute bronchitis, unspecified: Secondary | ICD-10-CM | POA: Diagnosis not present

## 2019-08-10 DIAGNOSIS — Z20828 Contact with and (suspected) exposure to other viral communicable diseases: Secondary | ICD-10-CM | POA: Diagnosis present

## 2019-08-10 DIAGNOSIS — E162 Hypoglycemia, unspecified: Secondary | ICD-10-CM | POA: Diagnosis present

## 2019-08-10 DIAGNOSIS — E878 Other disorders of electrolyte and fluid balance, not elsewhere classified: Secondary | ICD-10-CM | POA: Diagnosis present

## 2019-08-10 DIAGNOSIS — Z791 Long term (current) use of non-steroidal anti-inflammatories (NSAID): Secondary | ICD-10-CM | POA: Diagnosis not present

## 2019-08-10 DIAGNOSIS — R197 Diarrhea, unspecified: Secondary | ICD-10-CM | POA: Diagnosis present

## 2019-08-10 DIAGNOSIS — E861 Hypovolemia: Secondary | ICD-10-CM | POA: Diagnosis present

## 2019-08-10 DIAGNOSIS — I272 Pulmonary hypertension, unspecified: Secondary | ICD-10-CM | POA: Diagnosis present

## 2019-08-10 LAB — CBC
HCT: 32.3 % — ABNORMAL LOW (ref 39.0–52.0)
Hemoglobin: 11 g/dL — ABNORMAL LOW (ref 13.0–17.0)
MCH: 32.4 pg (ref 26.0–34.0)
MCHC: 34.1 g/dL (ref 30.0–36.0)
MCV: 95 fL (ref 80.0–100.0)
Platelets: 213 10*3/uL (ref 150–400)
RBC: 3.4 MIL/uL — ABNORMAL LOW (ref 4.22–5.81)
RDW: 17.7 % — ABNORMAL HIGH (ref 11.5–15.5)
WBC: 4.4 10*3/uL (ref 4.0–10.5)
nRBC: 0 % (ref 0.0–0.2)

## 2019-08-10 LAB — COMPREHENSIVE METABOLIC PANEL
ALT: 8 U/L (ref 0–44)
AST: 19 U/L (ref 15–41)
Albumin: 3.3 g/dL — ABNORMAL LOW (ref 3.5–5.0)
Alkaline Phosphatase: 60 U/L (ref 38–126)
Anion gap: 11 (ref 5–15)
BUN: 6 mg/dL — ABNORMAL LOW (ref 8–23)
CO2: 23 mmol/L (ref 22–32)
Calcium: 8.9 mg/dL (ref 8.9–10.3)
Chloride: 94 mmol/L — ABNORMAL LOW (ref 98–111)
Creatinine, Ser: 0.41 mg/dL — ABNORMAL LOW (ref 0.61–1.24)
GFR calc Af Amer: >60 mL/min (ref >60)
GFR calc non Af Amer: >60 mL/min (ref >60)
Glucose, Bld: 112 mg/dL — ABNORMAL HIGH (ref 70–99)
Potassium: 3.9 mmol/L (ref 3.5–5.1)
Sodium: 128 mmol/L — ABNORMAL LOW (ref 135–145)
Total Bilirubin: 1.3 mg/dL — ABNORMAL HIGH (ref 0.3–1.2)
Total Protein: 7.6 g/dL (ref 6.5–8.1)

## 2019-08-10 LAB — MAGNESIUM: Magnesium: 2.2 mg/dL (ref 1.7–2.4)

## 2019-08-10 LAB — SODIUM: Sodium: 130 mmol/L — ABNORMAL LOW (ref 135–145)

## 2019-08-10 LAB — GLUCOSE, CAPILLARY
Glucose-Capillary: 112 mg/dL — ABNORMAL HIGH (ref 70–99)
Glucose-Capillary: 114 mg/dL — ABNORMAL HIGH (ref 70–99)
Glucose-Capillary: 117 mg/dL — ABNORMAL HIGH (ref 70–99)
Glucose-Capillary: 90 mg/dL (ref 70–99)

## 2019-08-10 LAB — ECHOCARDIOGRAM COMPLETE
Height: 69 in
Weight: 2504.43 oz

## 2019-08-10 LAB — OSMOLALITY, URINE: Osmolality, Ur: 739 mOsm/kg (ref 300–900)

## 2019-08-10 LAB — OSMOLALITY: Osmolality: 277 mOsm/kg (ref 275–295)

## 2019-08-10 LAB — SARS CORONAVIRUS 2 (TAT 6-24 HRS): SARS Coronavirus 2: NEGATIVE

## 2019-08-10 LAB — PHOSPHORUS: Phosphorus: 2.3 mg/dL — ABNORMAL LOW (ref 2.5–4.6)

## 2019-08-10 LAB — NA AND K (SODIUM & POTASSIUM), RAND UR
Potassium Urine: 35 mmol/L
Sodium, Ur: 172 mmol/L

## 2019-08-10 LAB — PROTIME-INR
INR: 1 (ref 0.8–1.2)
Prothrombin Time: 13.5 seconds (ref 11.4–15.2)

## 2019-08-10 LAB — BETA-HYDROXYBUTYRIC ACID: Beta-Hydroxybutyric Acid: 3.95 mmol/L — ABNORMAL HIGH (ref 0.05–0.27)

## 2019-08-10 MED ORDER — PREDNISOLONE ACETATE 1 % OP SUSP
1.0000 [drp] | Freq: Three times a day (TID) | OPHTHALMIC | Status: DC
  Administered 2019-08-10 – 2019-08-18 (×22): 1 [drp] via OPHTHALMIC
  Filled 2019-08-10: qty 1

## 2019-08-10 MED ORDER — MAGNESIUM SULFATE 2 GM/50ML IV SOLN
2.0000 g | Freq: Once | INTRAVENOUS | Status: AC
  Administered 2019-08-10: 2 g via INTRAVENOUS
  Filled 2019-08-10: qty 50

## 2019-08-10 MED ORDER — ENSURE ENLIVE PO LIQD
237.0000 mL | Freq: Three times a day (TID) | ORAL | Status: DC
  Administered 2019-08-10 – 2019-08-18 (×15): 237 mL via ORAL

## 2019-08-10 MED ORDER — K PHOS MONO-SOD PHOS DI & MONO 155-852-130 MG PO TABS
500.0000 mg | ORAL_TABLET | Freq: Once | ORAL | Status: AC
  Administered 2019-08-10: 500 mg via ORAL
  Filled 2019-08-10: qty 2

## 2019-08-10 MED ORDER — AMLODIPINE BESYLATE 5 MG PO TABS: 2.5000 mg | ORAL_TABLET | Freq: Every day | ORAL | Status: DC

## 2019-08-10 MED ORDER — PANTOPRAZOLE SODIUM 40 MG PO TBEC
40.0000 mg | DELAYED_RELEASE_TABLET | Freq: Every day | ORAL | Status: DC
  Administered 2019-08-10 – 2019-08-18 (×9): 40 mg via ORAL
  Filled 2019-08-10 (×10): qty 1

## 2019-08-10 MED ORDER — POTASSIUM CHLORIDE CRYS ER 20 MEQ PO TBCR
40.0000 meq | EXTENDED_RELEASE_TABLET | Freq: Once | ORAL | Status: AC
  Administered 2019-08-10: 40 meq via ORAL
  Filled 2019-08-10: qty 2

## 2019-08-10 MED ORDER — MELOXICAM 7.5 MG PO TABS
15.0000 mg | ORAL_TABLET | Freq: Every day | ORAL | Status: DC
  Administered 2019-08-10 – 2019-08-12 (×3): 15 mg via ORAL
  Filled 2019-08-10 (×3): qty 2

## 2019-08-10 NOTE — Progress Notes (Signed)
*  PRELIMINARY RESULTS* Echocardiogram 2D Echocardiogram has been performed.  Javier Robinson 08/10/2019, 10:04 AM

## 2019-08-10 NOTE — Progress Notes (Addendum)
PROGRESS NOTE    Javier Robinson  QIH:[id number] DOB: Jun 23, 1946 DOA: 08/09/2019  PCP: Derwood KaplanEason, Ernest B, MD    LOS - 0   Brief Narrative:  73 y.o. male with medical history significant of alcohol dependence and hypertension brought to ED today via EMS after being found altered by his girlfriend at home.  EMS found patient hypoglycemic, treated en route, recurred in the ED, requiring D10 infusion.  Also with hyponatremia and acute encephalopathy on admission.  Hospital admission 06/30/19 to 07/03/19 for similar presentation.  In the ED, hypertensive 170/84, O2 sat 90% on room air, other vitals normal.  Labs notable for Na 132, Cl 93, CO2 21, anion gap 18, total bili 1.4, hs-troponin 21, Hbg 11.9.  Hypoglycemia which recurred after initial treatment in the ED and patient started on D5-1/2NS fluid.  No leukocytosis.  CT head was negative.  Chest xray negative.  Admitted further evaluation and management of acute encephalopathy, hypoglycemia and hyponatremia.  Placed on CIWA protocol in case of alcohol withdrawal.  Subjective 12/22: Patient seen this AM, awake sitting up in bed.  He is more alert today.  Says he feels better.  Says last alcoholic beverage was day before yesterday.  Denies tremor, diaphoresis, fever/chills or other complaints.  Overnight, patient had a brief episode of A-fib that converted to normal sinus.     Assessment & Plan:   Active Problems:   Hypoglycemia   Metabolic acidosis, increased anion gap (IAG)   Hyponatremia   Acute encephalopathy   Alcohol dependence (HCC)   Essential hypertension   Prolonged QT interval  Episode of A-fib, resolved, converted to NSR. --unclear if any hx of a-fib.   --continue telemetry monitoring --EKG if a-fib recurs  --maintain K>4.0 and Mg>2.0  Hypovolemic Hypotonic Hyponatremia - most likely hypovolemic, possilby beer podomania.  Na 132 on admission, 128 today.  Last admission sodium improved with normal saline.  Serum osm 277,  elevated urine osm 739, elevated urine Na 73.  Differential includes renal loss, diuretics, hypoaldosteronism, RTA, ketonuria (UA had ketone, and serum beta-hydroxybutyrate elevated), osmotic diuresis. In this case - suspect multifactorial, with starvation ketosis and alcoholism contributing. --stopped IV fluids  --monitor BMP, afternoon sodium pending  Hypoglycemia - resolved. Suspect due to little PO intake and alcoholism.  Patient not diabetic.  Last Hbg A1c 5.0 on 06/30/19.  Continue CBG's -- stopped d5w-ns  Hypophosphatemia - concern for refeeding syndrome in alcoholic patient that does not eat.  Phos 2.9 on admission, down to 2.3 today.  Will give K-phos today.  Repeat phos level with AM labs.  Continue to replete.  Educate patient on diet and nutrition. --dietician consulted   Increased anion gap (IAG) - due to starvation ketoacidosis.  Resolved. Most likely in this patient starvation ketoacidosis, or possibly rhabdo as well.  Not uremic.  Lactate normal.  CK and salicylates normal.  Serum beta-hydroxybutyrate elevated. --monitor BMP  Acute encephalopathy - most likely this is due to hypoglycemia and alcohol abuse.  Syncope, seizure, CVA/TIA in differential.  No focal neurologic deficits.  Unclear if history of seizure disorder or withdrawal seizures at this time.  CT head negative.  Checking CK, if elevated then seizure higher in differential. --further eval and neuro consult if not improving once hypoglycemia resolved --consider EEG, esp if CK elevated --neuro checks --fall precautions  QTc Prolongation - QTc 544 on ECG of 12/21. --keep K>4.0 and Mg>2.0 --monitor on telemetry --avoid QT prolonging agents  Alcohol dependence  --placed on CIWA protocol  Essential hypertension --continue amlodipine increase dose to 5mg  --IV labetalol PRN    DVT prophylaxis: Lovenos   Code Status: Full Code  Family Communication: none at bedside  Disposition Plan:  Expect discharge  home, pending further improvement of hyponatremia.  Severity of Illness: The appropriate patient status for this patient is INPATIENT. Inpatient status is judged to be reasonable and necessary in order to provide the required intensity of service to ensure the patient's safety. The patient's presenting symptoms, physical exam findings, and initial radiographic and laboratory data in the context of their chronic comorbidities is felt to place them at high risk for further clinical deterioration and refeeding syndrome which can be fatal. Furthermore, it is not anticipated that the patient will be medically stable for discharge from the hospital within 2 midnights of admission. The following factors support the patient status of inpatient.    "           The patient's presenting symptoms include altered mental status. "           The worrisome physical exam findings include altered mental status. "           The laboratory data are worrisome because of hyponatremia, hypophosphatemia and metabolic acidosis. "           The chronic co-morbidities include alcohol dependence, malnutrition, hypertension, diastolic CHF, QT prolongation.     * I certify that at the point of admission it is my clinical judgment that the patient will require inpatient hospital care spanning beyond 2 midnights from the point of admission due to high intensity of service, high risk for further deterioration and high frequency of surveillance required.*  Consultants:   None  Procedures:   Echo 12/22 - EF 55-60%, mild LVH  Antimicrobials:   None   Objective: Vitals:   08/09/19 2200 08/09/19 2238 08/09/19 2315 08/10/19 0346  BP: (!) 141/83 (!) 175/55 (!) 141/81 130/71  Pulse: 84  91 96  Resp: (!) 24  (!) 26 16  Temp:    98.3 F (36.8 C)  TempSrc:    Oral  SpO2: 98%  96% 95%  Weight:      Height:        Intake/Output Summary (Last 24 hours) at 08/10/2019 0731 Last data filed at 08/10/2019 0500 Gross per 24  hour  Intake 676.62 ml  Output --  Net 676.62 ml   Filed Weights   08/09/19 1510  Weight: 71 kg    Examination:  General exam: more alert today, still appears drowsy, no acute distress HEENT: moist mucus membranes, hearing grossly normal  Respiratory system: diminished but clear bilaterally, no wheezes, rales or rhonchi, normal respiratory effort. Cardiovascular system: normal S1/S2, RRR, no JVD, murmurs, rubs, gallops, no pedal edema.   Gastrointestinal system: soft, non-tender, non-distended abdomen Central nervous system: alert and oriented x3. no gross focal neurologic deficits.  Speech is not slurred but difficult to understand which is apparently baseline Extremities: moves all, no edema, normal tone Skin: dry, intact, normal temperature Psychiatry: normal mood, congruent affect, abnormal judgement and insight     Data Reviewed: I have personally reviewed following labs and imaging studies  CBC: Recent Labs  Lab 08/09/19 1525 08/09/19 1805 08/10/19 0340  WBC 6.2 6.5 4.4  NEUTROABS 4.4  --   --   HGB 11.9* 12.0* 11.0*  HCT 36.4* 36.4* 32.3*  MCV 97.6 98.1 95.0  PLT 207 203 213   Basic Metabolic Panel: Recent Labs  Lab 08/09/19  1525 08/09/19 1805 08/09/19 1806 08/10/19 0340  NA 132* 129*  --  128*  K 3.7 3.6  --  3.9  CL 93* 92*  --  94*  CO2 21* 22  --  23  GLUCOSE 74 135*  --  112*  BUN 8 8  --  6*  CREATININE 0.61 0.58*  --  0.41*  CALCIUM 9.2 8.9  --  8.9  MG  --   --  1.8 2.2  PHOS  --   --  2.9 2.3*   GFR: Estimated Creatinine Clearance: 79.8 mL/min (A) (by C-G formula based on SCr of 0.41 mg/dL (L)). Liver Function Tests: Recent Labs  Lab 08/09/19 1525 08/09/19 1805 08/10/19 0340  AST ALT ALKPHOS 65 65 60  BILITOT 1.4* 1.3* 1.3*  PROT 8.5* 7.9 7.6  ALBUMIN 3.7 3.5 3.3*   No results for input(s): LIPASE, AMYLASE in the last 168 hours. No results for input(s): AMMONIA in the last 168 hours. Coagulation  Profile: Recent Labs  Lab 08/10/19 0340  INR 1.0   Cardiac Enzymes: Recent Labs  Lab 08/09/19 1805  CKTOTAL 103   BNP (last 3 results) No results for input(s): PROBNP in the last 8760 hours. HbA1C: No results for input(s): HGBA1C in the last 72 hours. CBG: Recent Labs  Lab 08/09/19 2235 08/10/19 0010 08/10/19 0206 08/10/19 0344 08/10/19 0613  GLUCAP 77 90 112* 114* 117*   Lipid Profile: No results for input(s): CHOL, HDL, LDLCALC, TRIG, CHOLHDL, LDLDIRECT in the last 72 hours. Thyroid Function Tests: No results for input(s): TSH, T4TOTAL, FREET4, T3FREE, THYROIDAB in the last 72 hours. Anemia Panel: No results for input(s): VITAMINB12, FOLATE, FERRITIN, TIBC, IRON, RETICCTPCT in the last 72 hours. Sepsis Labs: Recent Labs  Lab 08/09/19 1526 08/09/19 1805  LATICACIDVEN 1.5 1.7    Recent Results (from the past 240 hour(s))  SARS CORONAVIRUS 2 (TAT 6-24 HRS) Nasopharyngeal Nasopharyngeal Swab     Status: None   Collection Time: 08/09/19  6:05 PM   Specimen: Nasopharyngeal Swab  Result Value Ref Range Status   SARS Coronavirus 2 NEGATIVE NEGATIVE Final    Comment: (NOTE) SARS-CoV-2 target nucleic acids are NOT DETECTED. The SARS-CoV-2 RNA is generally detectable in upper and lower respiratory specimens during the acute phase of infection. Negative results do not preclude SARS-CoV-2 infection, do not rule out co-infections with other pathogens, and should not be used as the sole basis for treatment or other patient management decisions. Negative results must be combined with clinical observations, patient history, and epidemiological information. The expected result is Negative. Fact Sheet for Patients: HairSlick.no Fact Sheet for Healthcare Providers: quierodirigir.com This test is not yet approved or cleared by the [place] FDA and  has been authorized for detection and/or diagnosis of SARS-CoV-2  by FDA under an Emergency Use Authorization (EUA). This EUA will remain  in effect (meaning this test can be used) for the duration of the COVID-19 declaration under Section 56 4(b)(1) of the Act, 21 U.S.C. section 360bbb-3(b)(1), unless the authorization is terminated or revoked sooner. Performed at [facility], [address] [place], [place] [address]          Radiology Studies: CT Head Wo Contrast  Result Date: 08/09/2019 CLINICAL DATA:  Altered mental status of unknown etiology, hyperglycemia, history CHF, alcohol abuse EXAM: CT HEAD WITHOUT CONTRAST TECHNIQUE: Contiguous axial images were obtained from the base of the skull through the vertex without intravenous  contrast. Sagittal and coronal MPR images reconstructed from axial data set. COMPARISON:  07/02/2019 FINDINGS: Brain: Generalized atrophy. Cavum septum pellucidum and vergae. Otherwise normal ventricular morphology. No midline shift or mass effect. Small vessel chronic ischemic changes of deep cerebral white matter. 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 No intracranial hemorrhage, mass lesion, or evidence of acute infarction. No extra-axial fluid collections. Vascular: Atherosclerotic calcifications of internal carotid and vertebral arteries at skull base Skull: Intact Sinuses/Orbits: Minimal mucosal thickening in seen on a sinus. Remaining paranasal sinuses and mastoid air cells clear Other: N/A IMPRESSION: Atrophy with minimal small vessel chronic ischemic changes of deep cerebral white matter. 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 No acute intracranial abnormalities. Electronically Signed   By: Lavonia Dana M.D.   On: 08/09/2019 15:57   DG Chest Portable 1 View  Result Date: 08/09/2019 CLINICAL DATA:  Altered mental status. Hypoglycemia. EXAM: PORTABLE CHEST 1 VIEW COMPARISON:  Radiographs 06/30/2019 and 02/18/2014. FINDINGS: 1518 hours. The heart size and mediastinal contours are stable. The lungs appear clear.  Portions of the left lung are obscured by overlying telemetry leads. There is no pleural effusion or pneumothorax. No acute osseous findings. Old left-sided rib fractures noted. IMPRESSION: Stable chest. No active cardiopulmonary process. Electronically Signed   By: Richardean Sale M.D.   On: 08/09/2019 15:46        Scheduled Meds: . amLODipine  5 mg Oral Daily  . enoxaparin (LOVENOX) injection  40 mg Subcutaneous Q24H  . feeding supplement (ENSURE ENLIVE)  237 mL Oral TID BM  . folic acid  1 mg Oral Daily  . meloxicam  15 mg Oral QPC breakfast  . multivitamin with minerals  1 tablet Oral Daily  . pantoprazole  40 mg Oral Q0600  . prednisoLONE acetate  1 drop Left Eye TID  . thiamine  100 mg Oral Daily   Continuous Infusions: . dextrose 5 % and 0.9% NaCl 75 mL/hr at 08/10/19 0500     LOS: 0 days    Time spent: 35-40 minutes    Ezekiel Slocumb, DO [facility]   If 7PM-7AM, please contact night-coverage [email or web address] Password [facility] 08/10/2019, 7:31 AM

## 2019-08-10 NOTE — Progress Notes (Signed)
Reported by staff paient short episodes of atrial fib.  Review of labs , magnesium 1.8  K 3.6,  Replacements ordered  To obtain goal of K+ above 4.0 and Mg+ above 2.  .  Also ordered labetalol dose but patient converted to SR prior to receiving. Added thyroid panel to labs. Continue to monitor on tele and dextrose fluids for hypoglycemeia prevention

## 2019-08-10 NOTE — Progress Notes (Signed)
Initial Nutrition Assessment  DOCUMENTATION CODES:   Non-severe (moderate) malnutrition in context of social or environmental circumstances  INTERVENTION:  Continue Ensure Enlive po TID, each supplement provides 350 kcal and 20 grams of protein.  Continue MVI daily, thiamine 937 mg daily, folic acid 1 mg daily.  Continue monitoring potassium, magnesium, and phosphorus daily and replacing as needed as patient is at risk for refeeding syndrome in setting of moderate malnutrition, EtOH abuse.  NUTRITION DIAGNOSIS:   Moderate Malnutrition related to social / environmental circumstances(EtOH abuse, inadequate oral intake) as evidenced by moderate fat depletion, mild-moderate muscle depletion.  GOAL:   Patient will meet greater than or equal to 90% of their needs  MONITOR:   PO intake, Supplement acceptance, Labs, Weight trends, I & O's  REASON FOR ASSESSMENT:   Consult Assessment of nutrition requirement/status  ASSESSMENT:   73 year old male with PMHx of arthritis, seizures, EtOH dependence admitted with hypoglycemia, increased anion gap, hyponatremia, acute encephalopathy.    Met with patient at bedside. He reports he has not been eating well PTA. He is unable to provide exact details on intake. He reports he was not eating well due to EtOH use. He is eating well here and finishing 100% of meals. He is also enjoying the Ensure. Encouraged patient to eat well-balanced, high calorie/protein meals at home. Also encouraged use of oral nutrition supplement at home. He reports he lives with his girlfriend who would be able to cook meals for him.  Patient reports UBW was around 180 lbs. According to weight history patient was 84.4 kg on 04/09/2018. He is now 71 kg (156.53 lbs). He has lost 13.4 kg (15.9% body weight) over the past 16 months, which is not significant for time frame but is still concerning.  Medications reviewed and include: folic acid 1 mg daily, MVI daily, pantoprazole,  thiamine 100 mg daily.  Labs reviewed: CBG 112-117, Sodium 128, Chloride 94, BUN 6, Creatinine 0.41, Phosphorus 2.3.  NUTRITION - FOCUSED PHYSICAL EXAM:    Most Recent Value  Orbital Region  Mild depletion  Upper Arm Region  Moderate depletion  Thoracic and Lumbar Region  Mild depletion  Buccal Region  Mild depletion  Temple Region  Moderate depletion  Clavicle Bone Region  Moderate depletion  Clavicle and Acromion Bone Region  Moderate depletion  Scapular Bone Region  Moderate depletion  Dorsal Hand  Mild depletion  Patellar Region  Mild depletion  Anterior Thigh Region  Mild depletion  Posterior Calf Region  Mild depletion  Edema (RD Assessment)  None  Hair  Reviewed  Eyes  Reviewed  Mouth  Reviewed  Skin  Reviewed  Nails  Reviewed     Diet Order:   Diet Order            Diet regular Room service appropriate? Yes; Fluid consistency: Thin  Diet effective now             EDUCATION NEEDS:   No education needs have been identified at this time  Skin:  Skin Assessment: Reviewed RN Assessment  Last BM:  Unknown/PTA  Height:   Ht Readings from Last 1 Encounters:  08/09/19 '5\' 9"'$  (1.753 m)   Weight:   Wt Readings from Last 1 Encounters:  08/09/19 71 kg   Ideal Body Weight:  72.7 kg  BMI:  Body mass index is 23.11 kg/m.  Estimated Nutritional Needs:   Kcal:  1800-2000  Protein:  90-100 grams  Fluid:  1.8-2 L/day  Jacklynn Barnacle, MS,  RD, LDN Office: [phone] Pager: [phone] After Hours/Weekend Pager: [phone]

## 2019-08-11 ENCOUNTER — Inpatient Hospital Stay: Payer: Medicare Other

## 2019-08-11 LAB — THYROID PANEL WITH TSH
Free Thyroxine Index: 1.4 (ref 1.2–4.9)
T3 Uptake Ratio: 33 % (ref 24–39)
T4, Total: 4.3 ug/dL — ABNORMAL LOW (ref 4.5–12.0)
TSH: 2.23 u[IU]/mL (ref 0.450–4.500)

## 2019-08-11 LAB — BASIC METABOLIC PANEL
Anion gap: 9 (ref 5–15)
BUN: <5 mg/dL — ABNORMAL LOW (ref 8–23)
CO2: 25 mmol/L (ref 22–32)
Calcium: 8.8 mg/dL — ABNORMAL LOW (ref 8.9–10.3)
Chloride: 96 mmol/L — ABNORMAL LOW (ref 98–111)
Creatinine, Ser: 0.58 mg/dL — ABNORMAL LOW (ref 0.61–1.24)
GFR calc Af Amer: >60 mL/min (ref >60)
GFR calc non Af Amer: >60 mL/min (ref >60)
Glucose, Bld: 110 mg/dL — ABNORMAL HIGH (ref 70–99)
Potassium: 3.6 mmol/L (ref 3.5–5.1)
Sodium: 130 mmol/L — ABNORMAL LOW (ref 135–145)

## 2019-08-11 LAB — BLOOD GAS, ARTERIAL
Acid-Base Excess: 3.5 mmol/L — ABNORMAL HIGH (ref 0.0–2.0)
Bicarbonate: 26.7 mmol/L (ref 20.0–28.0)
FIO2: 0.21
O2 Saturation: 95.7 %
Patient temperature: 37
pCO2 arterial: 35 mmHg (ref 32.0–48.0)
pH, Arterial: 7.49 — ABNORMAL HIGH (ref 7.350–7.450)
pO2, Arterial: 73 mmHg — ABNORMAL LOW (ref 83.0–108.0)

## 2019-08-11 LAB — PHOSPHORUS: Phosphorus: 1.6 mg/dL — ABNORMAL LOW (ref 2.5–4.6)

## 2019-08-11 LAB — AMMONIA: Ammonia: 30 umol/L (ref 9–35)

## 2019-08-11 LAB — TSH: TSH: 5.145 u[IU]/mL — ABNORMAL HIGH (ref 0.350–4.500)

## 2019-08-11 LAB — MAGNESIUM: Magnesium: 1.8 mg/dL (ref 1.7–2.4)

## 2019-08-11 LAB — T4, FREE: Free T4: 1.07 ng/dL (ref 0.61–1.12)

## 2019-08-11 MED ORDER — POTASSIUM PHOSPHATE MONOBASIC 500 MG PO TABS
1000.0000 mg | ORAL_TABLET | Freq: Three times a day (TID) | ORAL | Status: DC
  Filled 2019-08-11 (×2): qty 2

## 2019-08-11 MED ORDER — LACTULOSE 10 GM/15ML PO SOLN
20.0000 g | Freq: Two times a day (BID) | ORAL | Status: DC
  Administered 2019-08-12 – 2019-08-17 (×12): 20 g via ORAL
  Filled 2019-08-11 (×14): qty 30

## 2019-08-11 MED ORDER — SODIUM CHLORIDE 0.9% FLUSH
10.0000 mL | Freq: Two times a day (BID) | INTRAVENOUS | Status: DC
  Administered 2019-08-11 – 2019-08-14 (×7): 10 mL

## 2019-08-11 MED ORDER — SODIUM PHOSPHATES 45 MMOLE/15ML IV SOLN
10.0000 mmol | Freq: Once | INTRAVENOUS | Status: AC
  Administered 2019-08-11: 14:00:00 10 mmol via INTRAVENOUS
  Filled 2019-08-11: qty 3.33

## 2019-08-11 MED ORDER — MAGNESIUM SULFATE 2 GM/50ML IV SOLN
2.0000 g | Freq: Once | INTRAVENOUS | Status: AC
  Administered 2019-08-11: 2 g via INTRAVENOUS
  Filled 2019-08-11: qty 50

## 2019-08-11 MED ORDER — THIAMINE HCL 100 MG/ML IJ SOLN
500.0000 mg | Freq: Once | INTRAVENOUS | Status: AC
  Administered 2019-08-11: 13:00:00 500 mg via INTRAVENOUS
  Filled 2019-08-11: qty 5

## 2019-08-11 MED ORDER — SODIUM CHLORIDE 0.9% FLUSH: 10.0000 mL | INTRAVENOUS | Status: DC | PRN

## 2019-08-11 MED ORDER — POTASSIUM CHLORIDE 10 MEQ/100ML IV SOLN
10.0000 meq | INTRAVENOUS | Status: AC
  Administered 2019-08-11 (×2): 10 meq via INTRAVENOUS
  Filled 2019-08-11: qty 100

## 2019-08-11 NOTE — Consult Note (Signed)
PHARMACY CONSULT NOTE - FOLLOW UP  Pharmacy Consult for Electrolyte Monitoring and Replacement   Recent Labs: Potassium (mmol/L)  Date Value  08/11/2019 3.6   Magnesium (mg/dL)  Date Value  08/11/2019 1.8   Calcium (mg/dL)  Date Value  08/11/2019 8.8 (L)   Albumin (g/dL)  Date Value  08/10/2019 3.3 (L)   Phosphorus (mg/dL)  Date Value  08/11/2019 1.6 (L)   Sodium (mmol/L)  Date Value  08/11/2019 130 (L)     Assessment: 73 y.o.malewith medical history significant ofalcohol dependence and hypertension.  Pharmacy has been consulted to monitor and replenish electrolytes.  Pt is at risk for refeeding syndrome per lack of prior oral nutrition.  Physician has ordered Sodium Phosphate 33mmol.  Goal of Therapy:  To obtain goal of K+ above 4.0 and Mg+ above 2, Phos wnl's  Plan:  Will order 2 runs of KCl 82meq IV and 2g IV Mg  Will recheck, Mg, Phos, and BMP with am labs.  Lu Duffel ,PharmD Clinical Pharmacist 08/11/2019 2:27 PM

## 2019-08-11 NOTE — Progress Notes (Signed)
PROGRESS NOTE    Javier Robinson  HEN:[id number] DOB: 11/01/1945 DOA: 08/09/2019  PCP: Marden Noble, MD    LOS - 1   Brief Narrative:  73 y.o. male with medical history significant of alcohol dependence and hypertension brought to ED today via EMS after being found altered by his girlfriend at home.  EMS found patient hypoglycemic, treated en route, recurred in the ED, requiring D10 infusion.  Also with hyponatremia and acute encephalopathy on admission.  Hospital admission 06/30/19 to 07/03/19 for similar presentation.  In the ED, hypertensive 170/84, O2 sat 90% on room air, other vitals normal.  Labs notable for Na 132, Cl 93, CO2 21, anion gap 18, total bili 1.4, hs-troponin 21, Hbg 11.9.  Hypoglycemia which recurred after initial treatment in the ED and patient started on D5-1/2NS fluid.  No leukocytosis.  CT head was negative.  Chest xray negative.  Admitted further evaluation and management of acute encephalopathy, hypoglycemia and hyponatremia.  Placed on CIWA protocol in case of alcohol withdrawal.  Subjective 12/23: Patient significantly confused.  Unable to follow any commands.  Nurse reports that the patient also had some hallucination.  CIWA significantly elevated from recent baseline.  Patient is on day 3 of hospitalization likely in the ventilator of withdrawal.   Assessment & Plan:   Active Problems:   Hypoglycemia   Hyponatremia   Acute encephalopathy   Alcohol dependence (HCC)   Essential hypertension   Metabolic acidosis, increased anion gap (IAG)   Prolonged QT interval   Malnutrition of moderate degree  Episode of A-fib, resolved, converted to NSR. --unclear if any hx of a-fib.   --continue telemetry monitoring --EKG if a-fib recurs  --maintain K>4.0 and Mg>2.0  Hypovolemic Hypotonic Hyponatremia - most likely hypovolemic, possilby beer podomania.  Na 132 on admission, 128 today.  Last admission sodium improved with normal saline.  Serum osm 277, elevated urine  osm 739, elevated urine Na 73.  Differential includes renal loss, diuretics, hypoaldosteronism, RTA, ketonuria (UA had ketone, and serum beta-hydroxybutyrate elevated), osmotic diuresis. In this case - suspect multifactorial, with starvation ketosis and alcoholism contributing. --stopped IV fluids  --monitor BMP, afternoon sodium pending  Hypoglycemia - resolved. Suspect due to little PO intake and alcoholism.  Patient not diabetic.  Last Hbg A1c 5.0 on 06/30/19.  Continue CBG's -- stopped d5w-ns  Hypophosphatemia - concern for refeeding syndrome in alcoholic patient that does not eat.  Phos 2.9 on admission, down to 2.3 today.  Will give K-phos today.  Repeat phos level with AM labs.  Continue to replete.  Educate patient on diet and nutrition. --dietician consulted   Increased anion gap (IAG) - due to starvation ketoacidosis.  Resolved. Most likely in this patient starvation ketoacidosis, or possibly rhabdo as well.  Not uremic.  Lactate normal.  CK and salicylates normal.  Serum beta-hydroxybutyrate elevated. --monitor BMP  Acute encephalopathy - most likely this is due to hypoglycemia and alcohol abuse.  Syncope, seizure, CVA/TIA in differential.  No focal neurologic deficits.  Unclear if history of seizure disorder or withdrawal seizures at this time.  CT head negative.  Checking CK, if elevated then seizure higher in differential. --further eval and neuro consult if not improving once hypoglycemia resolved --consider EEG, esp if CK elevated --neuro checks --fall precautions  QTc Prolongation - QTc 544 on ECG of 12/21. --keep K>4.0 and Mg>2.0 --monitor on telemetry --avoid QT prolonging agents  Alcohol dependence  --placed on CIWA protocol  Essential hypertension --continue amlodipine increase dose  to  --IV labetalol PRN  Alcohol withdrawal. Monitor on CIWA protocol. IV thiamine.  B12.  Monitor. Low threshold to transfer to ICU.   DVT prophylaxis: Lovenos   Code  Status: Full Code  Family Communication: none at bedside, attempted to reach the family twice but unable.  Left voicemails. Disposition Plan:  Expect discharge home, pending further improvement of hyponatremia.  Severity of Illness: The appropriate patient status for this patient is INPATIENT. Inpatient status is judged to be reasonable and necessary in order to provide the required intensity of service to ensure the patient's safety. The patient's presenting symptoms, physical exam findings, and initial radiographic and laboratory data in the context of their chronic comorbidities is felt to place them at high risk for further clinical deterioration and refeeding syndrome which can be fatal. Furthermore, it is not anticipated that the patient will be medically stable for discharge from the hospital within 2 midnights of admission. The following factors support the patient status of inpatient.    "           The patient's presenting symptoms include altered mental status. "           The worrisome physical exam findings include altered mental status. "           The laboratory data are worrisome because of hyponatremia, hypophosphatemia and metabolic acidosis. "           The chronic co-morbidities include alcohol dependence, malnutrition, hypertension, diastolic CHF, QT prolongation.     * I certify that at the point of admission it is my clinical judgment that the patient will require inpatient hospital care spanning beyond 2 midnights from the point of admission due to high intensity of service, high risk for further deterioration and high frequency of surveillance required.*  Consultants:   None  Procedures:   Echo 12/22 - EF 55-60%, mild LVH  Antimicrobials:   None   Objective: Vitals:   08/11/19 0517 08/11/19 0723 08/11/19 1554 08/11/19 2015  BP: 131/65 (!) 115/56 (!) 154/86 133/77  Pulse: (!) 52 (!) 54 89 93  Resp: Temp: 98.7 F (37.1 C) 98.4 F (36.9 C) 98.6 F  (37 C) 98.6 F (37 C)  TempSrc: Oral   Oral  SpO2: 99% 100% 99% 98%  Weight:      Height:        Intake/Output Summary (Last 24 hours) at 08/11/2019 2016 Last data filed at 08/11/2019 1300 Gross per 24 hour  Intake 360 ml  Output 275 ml  Net 85 ml   Filed Weights   08/09/19 1510  Weight: 71 kg    Examination:  General exam: more alert today, still appears drowsy, no acute distress HEENT: moist mucus membranes, hearing grossly normal  Respiratory system: diminished but clear bilaterally, no wheezes, rales or rhonchi, normal respiratory effort. Cardiovascular system: normal S1/S2, RRR, no JVD, murmurs, rubs, gallops, no pedal edema.   Gastrointestinal system: soft, non-tender, non-distended abdomen Central nervous system: alert and oriented x3. no gross focal neurologic deficits.  Speech is not slurred but difficult to understand which is apparently baseline Extremities: moves all, no edema, normal tone Skin: dry, intact, normal temperature Psychiatry: normal mood, congruent affect, abnormal judgement and insight     Data Reviewed: I have personally reviewed following labs and imaging studies  CBC: Recent Labs  Lab 08/09/19 1525 08/09/19 1805 08/10/19 0340  WBC 6.2 6.5 4.4  NEUTROABS 4.4  --   --  HGB 11.9* 12.0* 11.0*  HCT 36.4* 36.4* 32.3*  MCV 97.6 98.1 95.0  PLT 207 203 213   Basic Metabolic Panel: Recent Labs  Lab 08/09/19 1525 08/09/19 1805 08/09/19 1806 08/10/19 0340 08/10/19 1705 08/11/19 0856  NA 132* 129*  --  128* 130* 130*  K 3.7 3.6  --  3.9  --  3.6  CL 93* 92*  --  94*  --  96*  CO2 21* 22  --  23  --  25  GLUCOSE 74 135*  --  112*  --  110*  BUN 8 8  --  6*  --  <5*  CREATININE 0.61 0.58*  --  0.41*  --  0.58*  CALCIUM 9.2 8.9  --  8.9  --  8.8*  MG  --   --  1.8 2.2  --  1.8  PHOS  --   --  2.9 2.3*  --  1.6*   GFR: Estimated Creatinine Clearance: 79.8 mL/min (A) (by C-G formula based on SCr of 0.58 mg/dL (L)). Liver Function  Tests: Recent Labs  Lab 08/09/19 1525 08/09/19 1805 08/10/19 0340  AST 24 22 19   ALT 8 9 8   ALKPHOS 65 65 60  BILITOT 1.4* 1.3* 1.3*  PROT 8.5* 7.9 7.6  ALBUMIN 3.7 3.5 3.3*   No results for input(s): LIPASE, AMYLASE in the last 168 hours. Recent Labs  Lab 08/11/19 1118  AMMONIA 30   Coagulation Profile: Recent Labs  Lab 08/10/19 0340  INR 1.0   Cardiac Enzymes: Recent Labs  Lab 08/09/19 1805  CKTOTAL 103   BNP (last 3 results) No results for input(s): PROBNP in the last 8760 hours. HbA1C: No results for input(s): HGBA1C in the last 72 hours. CBG: Recent Labs  Lab 08/09/19 2235 08/10/19 0010 08/10/19 0206 08/10/19 0344 08/10/19 0613  GLUCAP 77 90 112* 114* 117*   Lipid Profile: No results for input(s): CHOL, HDL, LDLCALC, TRIG, CHOLHDL, LDLDIRECT in the last 72 hours. Thyroid Function Tests: Recent Labs    08/10/19 0047 08/11/19 1103  TSH 2.230 5.145*  T4TOTAL 4.3*  --   FREET4  --  1.07   Anemia Panel: No results for input(s): VITAMINB12, FOLATE, FERRITIN, TIBC, IRON, RETICCTPCT in the last 72 hours. Sepsis Labs: Recent Labs  Lab 08/09/19 1526 08/09/19 1805  LATICACIDVEN 1.5 1.7    Recent Results (from the past 240 hour(s))  SARS CORONAVIRUS 2 (TAT 6-24 HRS) Nasopharyngeal Nasopharyngeal Swab     Status: None   Collection Time: 08/09/19  6:05 PM   Specimen: Nasopharyngeal Swab  Result Value Ref Range Status   SARS Coronavirus 2 NEGATIVE NEGATIVE Final    Comment: (NOTE) SARS-CoV-2 target nucleic acids are NOT DETECTED. The SARS-CoV-2 RNA is generally detectable in upper and lower respiratory specimens during the acute phase of infection. Negative results do not preclude SARS-CoV-2 infection, do not rule out co-infections with other pathogens, and should not be used as the sole basis for treatment or other patient management decisions. Negative results must be combined with clinical observations, patient history, and epidemiological  information. The expected result is Negative. Fact Sheet for Patients: 08/11/19 Fact Sheet for Healthcare Providers: 08/11/19 This test is not yet approved or cleared by the [employer] FDA and  has been authorized for detection and/or diagnosis of SARS-CoV-2 by FDA under an Emergency Use Authorization (EUA). This EUA will remain  in effect (meaning this test can be used) for the duration of the COVID-19 declaration under Section  56 4(b)(1) of the Act, 21 U.S.C. section 360bbb-3(b)(1), unless the authorization is terminated or revoked sooner. Performed at [facility], [address] [place], [place] [address]          Radiology Studies: DG Chest Port 1 View  Result Date: 08/11/2019 CLINICAL DATA:  Shortness of breath, wheezing EXAM: PORTABLE CHEST 1 VIEW COMPARISON:  08/09/2019 FINDINGS: Peribronchial thickening. No confluent airspace opacities or effusions. Heart is upper limits normal in size. No acute bony abnormality. IMPRESSION: Bronchitic changes. Electronically Signed   By: Charlett Nose M.D.   On: 08/11/2019 11:03   ECHOCARDIOGRAM COMPLETE  Result Date: 08/10/2019   ECHOCARDIOGRAM REPORT   Patient Name:   Javier Robinson Date of Exam: 08/10/2019 Medical Rec #:  [id number]     Height:       69.0 in Accession #:    [id number]    Weight:       156.5 lb Date of Birth:  1946/06/24     BSA:          1.86 m Patient Age:    73 years      BP:           117/56 mmHg Patient Gender: M             HR:           74 bpm. Exam Location:  [facility] Procedure: 2D Echo, Color Doppler and Cardiac Doppler Indications:     Abnormal ECG 794.31  History:         Patient has no prior history of Echocardiogram examinations. No                  heart conditions listed in chart.  Sonographer:     Cristela Blue RDCS (AE) Referring Phys:  [id number] Alphonsus Sias GRIFFITH Diagnosing Phys: Harold Hedge MD  Sonographer Comments: No apical window.  IMPRESSIONS  1. Left ventricular ejection fraction, by visual estimation, is 55 to 60%. The left ventricle has normal function. Left ventricular septal wall thickness was mildly increased. Mildly increased left ventricular posterior wall thickness. There is mildly increased left ventricular hypertrophy.  2. The left ventricle has no regional wall motion abnormalities.  3. Global right ventricle has normal systolic function.The right ventricular size is mildly enlarged. No increase in right ventricular wall thickness.  4. Left atrial size was normal.  5. Right atrial size was normal.  6. The mitral valve was not well visualized. Trivial mitral valve regurgitation.  7. The tricuspid valve is not well visualized. Tricuspid valve regurgitation is mild.  8. The aortic valve was not well visualized. Aortic valve regurgitation is trivial.  9. The pulmonic valve was not well visualized. Pulmonic valve regurgitation is not visualized. 10. The aortic root was not well visualized. 11. Moderately elevated pulmonary artery systolic pressure. 12. The atrial septum is grossly normal. FINDINGS  Left Ventricle: Left ventricular ejection fraction, by visual estimation, is 55 to 60%. The left ventricle has normal function. The left ventricle has no regional wall motion abnormalities. Mildly increased left ventricular posterior wall thickness. There is mildly increased left ventricular hypertrophy. Right Ventricle: The right ventricular size is mildly enlarged. No increase in right ventricular wall thickness. Global RV systolic function is has normal systolic function. The tricuspid regurgitant velocity is 2.85 m/s, and with an assumed right atrial  pressure of 10 mmHg, the estimated right ventricular systolic pressure is moderately elevated at 42.5 mmHg. Left Atrium: Left atrial size was normal  in size. Right Atrium: Right atrial size was normal in size Pericardium: There is no evidence of pericardial effusion. Mitral Valve: The  mitral valve was not well visualized. Trivial mitral valve regurgitation. Tricuspid Valve: The tricuspid valve is not well visualized. Tricuspid valve regurgitation is mild. Aortic Valve: The aortic valve was not well visualized. Aortic valve regurgitation is trivial. Pulmonic Valve: The pulmonic valve was not well visualized. Pulmonic valve regurgitation is not visualized. Pulmonic regurgitation is not visualized. Aorta: The aortic root was not well visualized. IAS/Shunts: The atrial septum is grossly normal.  LEFT VENTRICLE PLAX 2D LVIDd:         3.28 cm LVIDs:         2.35 cm LV PW:         1.02 cm LV IVS:        1.41 cm LVOT diam:     2.00 cm LV SV:         24 ml LV SV Index:   13.08 LVOT Area:     3.14 cm  LEFT ATRIUM         Index LA diam:    2.50 cm 1.34 cm/m                        PULMONIC VALVE AORTA                 PV Vmax:        1.29 m/s Ao Root diam: 3.00 cm PV Peak grad:   6.7 mmHg                       RVOT Peak grad: 10 mmHg  TRICUSPID VALVE TR Peak grad:   32.5 mmHg TR Vmax:        285.00 cm/s  SHUNTS Systemic Diam: 2.00 cm  Harold HedgeKenneth Fath MD Electronically signed by Harold HedgeKenneth Fath MD Signature Date/Time: 08/10/2019/11:26:48 AM    Final         Scheduled Meds: . amLODipine  5 mg Oral Daily  . enoxaparin (LOVENOX) injection  40 mg Subcutaneous Q24H  . feeding supplement (ENSURE ENLIVE)  237 mL Oral TID BM  . folic acid  1 mg Oral Daily  . lactulose  20 g Oral BID  . meloxicam  15 mg Oral QPC breakfast  . multivitamin with minerals  1 tablet Oral Daily  . pantoprazole  40 mg Oral Q0600  . prednisoLONE acetate  1 drop Left Eye TID  . sodium chloride flush  10-40 mL Intracatheter Q12H  . thiamine  100 mg Oral Daily   Continuous Infusions:    LOS: 1 day    Time spent: 35-40 minutes    Lynden OxfordPranav Nahara Dona, DO [facility]   If 7PM-7AM, please contact night-coverage [email or web address] Password [email or web address] 08/11/2019, 8:16 PM

## 2019-08-11 NOTE — Evaluation (Signed)
Physical Therapy Evaluation Patient Details Name: Javier Robinson MRN: [id number] DOB: 12-18-1945 Today's Date: 08/11/2019   History of Present Illness  Per MD H&P: Pt is a 73 y.o. male with medical history significant of alcohol dependence and hypertension brought to ED today via EMS after being found altered by his girlfriend at home.  EMS found patient hypoglycemic, treated en route, recurred in the ED, requiring D10 infusion.  Also with hyponatremia and acute encephalopathy on admission.  Hospital admission 06/30/19 to 07/03/19 for similar presentation. Hypoglycemia which recurred after initial treatment in the ED.  CT head was negative.  Chest xray negative.  Admitted for further evaluation and management of acute encephalopathy, hypoglycemia and hyponatremia.  Placed on CIWA protocol in case of alcohol withdrawal. 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    Clinical Impression  Pt presented with deficits in strength, transfers, mobility, gait, balance, and activity tolerance.  Pt with significant functional weakness in the RUE and RLE compared to the L side with pt stating that he does have a h/o CVA.  R-sided weakness appears more pronounced than during recent admission in November.  Pt was unable to grasp the RW without assistance with the RUE and had R-sided LOB in sitting and standing requiring assist to correct.  Pt was unable to advance either LE during amb attempt and was assisted back to sitting.  Pt will benefit from PT services in a SNF setting upon discharge to safely address above deficits for decreased caregiver assistance and eventual return to PLOF.      Follow Up Recommendations SNF    Equipment Recommendations  None recommended by PT    Recommendations for Other Services       Precautions / Restrictions Precautions Precautions: Fall Restrictions Weight Bearing Restrictions: No      Mobility  Bed Mobility Overal bed mobility: Needs Assistance Bed Mobility:  Supine to Sit;Sit to Supine     Supine to sit: Mod assist Sit to supine: Mod assist   General bed mobility comments: Mod A for RLE and trunk control  Transfers Overall transfer level: Needs assistance Equipment used: Rolling walker (2 wheeled) Transfers: Sit to/from Stand Sit to Stand: Mod assist         General transfer comment: Mod A to stand and to prevent LOB to the right in standing  Ambulation/Gait             General Gait Details: Unable  Stairs            Wheelchair Mobility    Modified Rankin (Stroke Patients Only)       Balance Overall balance assessment: Needs assistance Sitting-balance support: Single extremity supported;Feet supported Sitting balance-Leahy Scale: Poor   Postural control: Right lateral lean Standing balance support: Bilateral upper extremity supported;During functional activity Standing balance-Leahy Scale: Poor Standing balance comment: Mod A to prevent LOB in standing                             Pertinent Vitals/Pain Pain Assessment: No/denies pain    Home Living Family/patient expects to be discharged to:: Private residence Living Arrangements: Other (Comment)(Girlfriend) Available Help at Discharge: Friend(s);Available 24 hours/day Type of Home: Apartment Home Access: Stairs to enter Entrance Stairs-Rails: Left Entrance Stairs-Number of Steps: 3 Home Layout: One level Home Equipment: Cane - single point;Walker - 2 wheels;Bedside commode;Shower seat Additional Comments: Pt is a poor historian this session, history from chart review  Prior Function           Comments: Pt reports being independent with ADLs, ambulates with RW, girlfriend cooks and cleans, reports a couple falls in last year and not able to get up independently but stated girlfriend was able to help him up, pt stated he was walking to the fridge to get a beer when he tripped     Hand Dominance   Dominant Hand: Right     Extremity/Trunk Assessment   Upper Extremity Assessment Upper Extremity Assessment: Generalized weakness;RUE deficits/detail;Difficult to assess due to impaired cognition RUE Deficits / Details: Pt with noted RUE weakness/AROM compared to the LUE during function including unable to support sitting balance with RUE or to grasp the RW    Lower Extremity Assessment Lower Extremity Assessment: Generalized weakness;RLE deficits/detail;Difficult to assess due to impaired cognition RLE Deficits / Details: Knee ext <3/5       Communication   Communication: No difficulties  Cognition Arousal/Alertness: Lethargic Behavior During Therapy: Flat affect Overall Cognitive Status: No family/caregiver present to determine baseline cognitive functioning                                 General Comments: Pt able to follow 1-step commands with extra time and cuing      General Comments      Exercises Other Exercises Other Exercises: Static sitting balance training and core strengthening Other Exercises: Multiple sit to/from stand training from various height surfaces with cues for sequencing   Assessment/Plan    PT Assessment Patient needs continued PT services  PT Problem List Decreased strength;Decreased activity tolerance;Decreased balance;Decreased mobility;Decreased knowledge of use of DME       PT Treatment Interventions DME instruction;Gait training;Stair training;Functional mobility training;Therapeutic activities;Therapeutic exercise;Balance training;Patient/family education    PT Goals (Current goals can be found in the Care Plan section)  Acute Rehab PT Goals PT Goal Formulation: Patient unable to participate in goal setting Time For Goal Achievement: 08/24/19 Potential to Achieve Goals: Fair    Frequency Min 2X/week   Barriers to discharge Inaccessible home environment      Co-evaluation               AM-PAC PT "6 Clicks" Mobility  Outcome Measure  Help needed turning from your back to your side while in a flat bed without using bedrails?: A Lot Help needed moving from lying on your back to sitting on the side of a flat bed without using bedrails?: A Lot Help needed moving to and from a bed to a chair (including a wheelchair)?: A Lot Help needed standing up from a chair using your arms (e.g., wheelchair or bedside chair)?: A Lot Help needed to walk in hospital room?: Total Help needed climbing 3-5 steps with a railing? : Total 6 Click Score: 10    End of Session Equipment Utilized During Treatment: Gait belt Activity Tolerance: Patient tolerated treatment well Patient left: in bed;with call bell/phone within reach;with bed alarm set Nurse Communication: Mobility status;Other (comment)(Concerns for increased R-sided weakness) PT Visit Diagnosis: Unsteadiness on feet (R26.81);Muscle weakness (generalized) (M62.81);Difficulty in walking, not elsewhere classified (R26.2)    Time: 3244-0102 PT Time Calculation (min) (ACUTE ONLY): 25 min   Charges:   PT Evaluation $PT Eval Moderate Complexity: 1 Mod PT Treatments $Therapeutic Activity: 8-22 mins        D. Scott Elen Acero PT, DPT 08/11/19, 1:29 PM

## 2019-08-12 LAB — BASIC METABOLIC PANEL
Anion gap: 14 (ref 5–15)
Anion gap: 9 (ref 5–15)
Anion gap: 9 (ref 5–15)
Anion gap: 9 (ref 5–15)
BUN: <5 mg/dL — ABNORMAL LOW (ref 8–23)
BUN: <5 mg/dL — ABNORMAL LOW (ref 8–23)
BUN: 6 mg/dL — ABNORMAL LOW (ref 8–23)
BUN: 6 mg/dL — ABNORMAL LOW (ref 8–23)
CO2: 20 mmol/L — ABNORMAL LOW (ref 22–32)
CO2: 22 mmol/L (ref 22–32)
CO2: 23 mmol/L (ref 22–32)
CO2: 24 mmol/L (ref 22–32)
Calcium: 9.1 mg/dL (ref 8.9–10.3)
Calcium: 9.1 mg/dL (ref 8.9–10.3)
Calcium: 9.1 mg/dL (ref 8.9–10.3)
Calcium: 9.2 mg/dL (ref 8.9–10.3)
Chloride: 93 mmol/L — ABNORMAL LOW (ref 98–111)
Chloride: 93 mmol/L — ABNORMAL LOW (ref 98–111)
Chloride: 94 mmol/L — ABNORMAL LOW (ref 98–111)
Chloride: 93 mmol/L — ABNORMAL LOW (ref 98–111)
Creatinine, Ser: 0.45 mg/dL — ABNORMAL LOW (ref 0.61–1.24)
Creatinine, Ser: 0.63 mg/dL (ref 0.61–1.24)
Creatinine, Ser: 0.52 mg/dL — ABNORMAL LOW (ref 0.61–1.24)
Creatinine, Ser: 0.63 mg/dL (ref 0.61–1.24)
GFR calc Af Amer: >60 mL/min (ref >60)
GFR calc Af Amer: >60 mL/min (ref >60)
GFR calc Af Amer: >60 mL/min (ref >60)
GFR calc Af Amer: >60 mL/min (ref >60)
GFR calc non Af Amer: >60 mL/min (ref >60)
GFR calc non Af Amer: >60 mL/min (ref >60)
GFR calc non Af Amer: >60 mL/min (ref >60)
GFR calc non Af Amer: >60 mL/min (ref >60)
Glucose, Bld: 88 mg/dL (ref 70–99)
Glucose, Bld: 137 mg/dL — ABNORMAL HIGH (ref 70–99)
Glucose, Bld: 93 mg/dL (ref 70–99)
Glucose, Bld: 117 mg/dL — ABNORMAL HIGH (ref 70–99)
Potassium: 4 mmol/L (ref 3.5–5.1)
Potassium: 3.9 mmol/L (ref 3.5–5.1)
Potassium: 4.1 mmol/L (ref 3.5–5.1)
Potassium: 4.1 mmol/L (ref 3.5–5.1)
Sodium: 127 mmol/L — ABNORMAL LOW (ref 135–145)
Sodium: 124 mmol/L — ABNORMAL LOW (ref 135–145)
Sodium: 126 mmol/L — ABNORMAL LOW (ref 135–145)
Sodium: 126 mmol/L — ABNORMAL LOW (ref 135–145)

## 2019-08-12 LAB — OSMOLALITY: Osmolality: 264 mOsm/kg — ABNORMAL LOW (ref 275–295)

## 2019-08-12 LAB — BRAIN NATRIURETIC PEPTIDE: B Natriuretic Peptide: 105 pg/mL — ABNORMAL HIGH (ref 0.0–100.0)

## 2019-08-12 LAB — CREATININE, URINE, RANDOM: Creatinine, Urine: 279 mg/dL

## 2019-08-12 LAB — MAGNESIUM: Magnesium: 2.2 mg/dL (ref 1.7–2.4)

## 2019-08-12 LAB — NA AND K (SODIUM & POTASSIUM), RAND UR
Potassium Urine: 49 mmol/L
Sodium, Ur: 165 mmol/L

## 2019-08-12 LAB — OSMOLALITY, URINE: Osmolality, Ur: 638 mOsm/kg (ref 300–900)

## 2019-08-12 LAB — PHOSPHORUS: Phosphorus: 2.7 mg/dL (ref 2.5–4.6)

## 2019-08-12 MED ORDER — THIAMINE HCL 100 MG/ML IJ SOLN
500.0000 mg | INTRAVENOUS | Status: AC
  Administered 2019-08-12 – 2019-08-14 (×3): 500 mg via INTRAVENOUS
  Filled 2019-08-12 (×3): qty 5

## 2019-08-12 MED ORDER — HYDRALAZINE HCL 25 MG PO TABS
25.0000 mg | ORAL_TABLET | Freq: Three times a day (TID) | ORAL | Status: DC
  Administered 2019-08-12 – 2019-08-18 (×15): 25 mg via ORAL
  Filled 2019-08-12 (×15): qty 1

## 2019-08-12 MED ORDER — DOXYCYCLINE HYCLATE 100 MG PO TABS
100.0000 mg | ORAL_TABLET | Freq: Two times a day (BID) | ORAL | Status: AC
  Administered 2019-08-12 – 2019-08-16 (×9): 100 mg via ORAL
  Filled 2019-08-12 (×10): qty 1

## 2019-08-12 MED ORDER — IPRATROPIUM-ALBUTEROL 0.5-2.5 (3) MG/3ML IN SOLN
3.0000 mL | Freq: Four times a day (QID) | RESPIRATORY_TRACT | Status: DC
  Administered 2019-08-12: 3 mL via RESPIRATORY_TRACT
  Filled 2019-08-12: qty 3

## 2019-08-12 MED ORDER — DM-GUAIFENESIN ER 30-600 MG PO TB12
1.0000 | ORAL_TABLET | Freq: Two times a day (BID) | ORAL | Status: DC
  Administered 2019-08-12 – 2019-08-18 (×13): 1 via ORAL
  Filled 2019-08-12 (×16): qty 1

## 2019-08-12 MED ORDER — IPRATROPIUM-ALBUTEROL 0.5-2.5 (3) MG/3ML IN SOLN
3.0000 mL | Freq: Two times a day (BID) | RESPIRATORY_TRACT | Status: DC
  Administered 2019-08-12 – 2019-08-18 (×12): 3 mL via RESPIRATORY_TRACT
  Filled 2019-08-12 (×12): qty 3

## 2019-08-12 MED ORDER — VITAMIN B-12 1000 MCG PO TABS
1000.0000 ug | ORAL_TABLET | Freq: Every day | ORAL | Status: DC
  Administered 2019-08-12 – 2019-08-18 (×7): 1000 ug via ORAL
  Filled 2019-08-12 (×7): qty 1

## 2019-08-12 NOTE — [place] FL2 (Signed)
[place] MEDICAID FL2 LEVEL OF CARE SCREENING TOOL     IDENTIFICATION  Patient Name: Javier Robinson Birthdate: 07-Jun-1946 Sex: male Admission Date (Current Location): 08/09/2019  [place] and [place] Number:  Chiropodist and Address:  [facility], [address] [place], [place] [address]      Provider Number: [id number]  Attending Physician Name and Address:  Rolly Salter, MD  Relative Name and Phone Number:  Haskell Flirt [phone]    Current Level of Care: Hospital Recommended Level of Care: Skilled Nursing Facility Prior Approval Number:    Date Approved/Denied:   PASRR Number: [id number]  Discharge Plan: SNF    Current Diagnoses: Patient Active Problem List   Diagnosis Date Noted  . Malnutrition of moderate degree 08/10/2019  . Acute encephalopathy 08/09/2019  . Alcohol dependence (HCC) 08/09/2019  . Essential hypertension 08/09/2019  . Metabolic acidosis, increased anion gap (IAG) 08/09/2019  . Prolonged QT interval 08/09/2019  . Hypertensive urgency 07/03/2019  . Acute CHF (congestive heart failure) (HCC) 06/30/2019  . Hypoglycemia 06/30/2019  . SIRS (systemic inflammatory response syndrome) (HCC) 06/30/2019  . Hyponatremia 06/30/2019  . Hypomagnesemia 06/30/2019    Orientation RESPIRATION BLADDER Height & Weight     Self, Place  Normal Incontinent Weight: 71 kg Height:  5\' 9"  (175.3 cm)  BEHAVIORAL SYMPTOMS/MOOD NEUROLOGICAL BOWEL NUTRITION STATUS      Incontinent Diet(Regular diet- Fluid restriction 12109ml/ day)  AMBULATORY STATUS COMMUNICATION OF NEEDS Skin   Extensive Assist Verbally Normal                       Personal Care Assistance Level of Assistance  Bathing, Feeding, Dressing Bathing Assistance: Maximum assistance Feeding assistance: Limited assistance Dressing Assistance: Maximum assistance     Functional Limitations Info             SPECIAL CARE FACTORS FREQUENCY  PT (By licensed  PT), OT (By licensed OT)     PT Frequency: 5 times per week OT Frequency: 5 times per week            Contractures Contractures Info: Not present    Additional Factors Info  Allergies, Code Status Code Status Info: Full Allergies Info: NKA           Current Medications (08/12/2019):  This is the current hospital active medication list Current Facility-Administered Medications  Medication Dose Route Frequency Provider Last Rate Last Admin  . acetaminophen (TYLENOL) tablet 650 mg  650 mg Oral Q6H PRN 08/14/2019 A, DO       Or  . acetaminophen (TYLENOL) suppository 650 mg  650 mg Rectal Q6H PRN Esaw Grandchild A, DO      . amLODipine (NORVASC) tablet 5 mg  5 mg Oral Daily Esaw Grandchild A, DO   5 mg at 08/12/19 1043  . bisacodyl (DULCOLAX) EC tablet 5 mg  5 mg Oral Daily PRN 08/14/19 A, DO      . dextromethorphan-guaiFENesin (MUCINEX DM) 30-600 MG per 12 hr tablet 1 tablet  1 tablet Oral BID Esaw Grandchild, MD   1 tablet at 08/12/19 1043  . doxycycline (VIBRA-TABS) tablet 100 mg  100 mg Oral Q12H 08/14/19, MD   100 mg at 08/12/19 1043  . enoxaparin (LOVENOX) injection 40 mg  40 mg Subcutaneous Q24H 08/14/19 A, DO   40 mg at 08/11/19 1800  . feeding supplement (ENSURE ENLIVE) (ENSURE ENLIVE) liquid 237 mL  237 mL  Oral TID BM Nicole Kindred A, DO   237 mL at 08/11/19 1349  . folic acid (FOLVITE) tablet 1 mg  1 mg Oral Daily Nicole Kindred A, DO   1 mg at 08/12/19 1043  . ipratropium-albuterol (DUONEB) 0.5-2.5 (3) MG/3ML nebulizer solution 3 mL  3 mL Nebulization QID Lavina Hamman, MD   3 mL at 08/12/19 1119  . labetalol (NORMODYNE) injection 10 mg  10 mg Intravenous Q4H PRN Nicole Kindred A, DO      . lactulose (CHRONULAC) 10 GM/15ML solution 20 g  20 g Oral BID Lavina Hamman, MD   20 g at 08/12/19 1044  . LORazepam (ATIVAN) tablet 1-4 mg  1-4 mg Oral Q1H PRN Nicole Kindred A, DO       Or  . LORazepam (ATIVAN) injection 1-4 mg  1-4 mg Intravenous  Q1H PRN Nicole Kindred A, DO   2 mg at 08/11/19 1353  . meloxicam (MOBIC) tablet 15 mg  15 mg Oral QPC breakfast Nicole Kindred A, DO   15 mg at 08/12/19 1044  . multivitamin with minerals tablet 1 tablet  1 tablet Oral Daily Nicole Kindred A, DO   1 tablet at 08/12/19 1043  . pantoprazole (PROTONIX) EC tablet 40 mg  40 mg Oral Q0600 Nicole Kindred A, DO   40 mg at 08/12/19 1048  . polyethylene glycol (MIRALAX / GLYCOLAX) packet 17 g  17 g Oral Daily PRN Nicole Kindred A, DO      . prednisoLONE acetate (PRED FORTE) 1 % ophthalmic suspension 1 drop  1 drop Left Eye TID Nicole Kindred A, DO   1 drop at 08/12/19 1044  . sodium chloride flush (NS) 0.9 % injection 10-40 mL  10-40 mL Intracatheter Q12H Lavina Hamman, MD   10 mL at 08/12/19 1046  . sodium chloride flush (NS) 0.9 % injection 10-40 mL  10-40 mL Intracatheter PRN Lavina Hamman, MD      . thiamine tablet 100 mg  100 mg Oral Daily Nicole Kindred A, DO   100 mg at 08/12/19 1043  . vitamin B-12 (CYANOCOBALAMIN) tablet 1,000 mcg  1,000 mcg Oral Daily Lavina Hamman, MD   1,000 mcg at 08/12/19 1043     Discharge Medications: Please see discharge summary for a list of discharge medications.  Relevant Imaging Results:  Relevant Lab Results:   Additional Information SSN [id number]  Shelbie Hutching, RN

## 2019-08-12 NOTE — TOC Initial Note (Signed)
Transition of Care [facility]) - Initial/Assessment Note    Patient Details  Name: Javier Robinson MRN: [id number] Date of Birth: [birth year]  [facility]) CM/SW Contact:    Shelbie Hutching, RN Phone Number: 08/12/2019, 1:21 PM  Clinical Narrative:                 Patient admitted with encephalopathy and hypoglycemia.  Patient is from home where he lives with his wife [name].  Patient will need skilled nursing rehab at discharge, [name] agrees to finding placement for short term rehab.  [name] prefers [facility].    Expected Discharge Plan: Skilled Nursing Facility Barriers to Discharge: Continued Medical Work up   Patient Goals and CMS Choice   CMS Medicare.gov Compare Post Acute Care list provided to:: Patient Represenative (must comment) Choice offered to / list presented to : Spouse  Expected Discharge Plan and Services Expected Discharge Plan: [place]   Discharge Planning Services: CM Consult Post Acute Care Choice: [place]                                        Prior Living Arrangements/Services   Lives with:: Spouse Patient language and need for interpreter reviewed:: Yes Do you feel safe going back to the place where you live?: Yes      Need for Family Participation in Patient Care: Yes (Comment) Care giver support system in place?: Yes (comment)(wife)   Criminal Activity/Legal Involvement Pertinent to Current Situation/Hospitalization: No - Comment as needed  Activities of Daily Living      Permission Sought/Granted Permission sought to share information with : Case Manager, Customer service manager, Family Supports Permission granted to share information with : Yes, Verbal Permission Granted     Permission granted to share info w AGENCY: Investment banker, operational granted to share info w Relationship: [name]- wife     Emotional Assessment Appearance:: Appears stated age     Orientation:  : Oriented to Self, Oriented to Place Alcohol / Substance Use: Alcohol Use Psych Involvement: No (comment)  Admission diagnosis:  Hypoglycemia [E16.2] Encephalopathy [G93.40] Acute encephalopathy [G93.40] Hyponatremia [E87.1] Patient Active Problem List   Diagnosis Date Noted  . Malnutrition of moderate degree 08/10/2019  . Acute encephalopathy 08/09/2019  . Alcohol dependence ([place]) 08/09/2019  . Essential hypertension 08/09/2019  . Metabolic acidosis, increased anion gap (IAG) 08/09/2019  . Prolonged QT interval 08/09/2019  . Hypertensive urgency 07/03/2019  . Acute CHF (congestive heart failure) ([place]) 06/30/2019  . Hypoglycemia 06/30/2019  . SIRS (systemic inflammatory response syndrome) ([place]) 06/30/2019  . Hyponatremia 06/30/2019  . Hypomagnesemia 06/30/2019   PCP:  Marden Noble, MD Pharmacy:   [place], [place] - [address] [place] [address] Phone: [phone] Fax: [phone]     Social Determinants of Health (SDOH) Interventions    Readmission Risk Interventions No flowsheet data found.

## 2019-08-12 NOTE — Care Management Important Message (Signed)
Important Message  Patient Details  Name: Javier Robinson MRN: [id number] Date of Birth: 06-Jun-1946   Medicare Important Message Given:  Yes     [name] [name] 08/12/2019, 10:51 AM

## 2019-08-12 NOTE — Evaluation (Signed)
Occupational Therapy Evaluation Patient Details Name: Javier Robinson MRN: [id number] DOB: [birth year] Today's Date: 08/12/2019    History of Present Illness Per MD H&P: Pt is a 73 y.o. male with medical history significant of alcohol dependence and hypertension brought to ED today via EMS after being found altered by his girlfriend at home.  EMS found patient hypoglycemic, treated en route, recurred in the ED, requiring D10 infusion.  Also with hyponatremia and acute encephalopathy on admission.  Hospital admission 06/30/19 to 07/03/19 for similar presentation. Hypoglycemia which recurred after initial treatment in the ED.  CT head was negative.  Chest xray negative.  Admitted for further evaluation and management of acute encephalopathy, hypoglycemia and hyponatremia.  Placed on CIWA protocol in case of alcohol withdrawal. 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   Clinical Impression   Pt seen for OT evaluation this date. Prior to hospital admission, pt reports being both Indep with self care, and also requiring some assist for bathing/dressing from his girlfriend.  Pt lives in apartment with 3 STE with his girlfriend who apparently also helps with his IADLs such as cooking/cleaning.  Currently pt demonstrates impairments in sitting tolerance and balance as well as gross weakness requiring MIN A with seated grooming, MOD A with UB dressing, and MAX A with most aspect of LB ADLs such as donning socks using sit/lateral lean technique. Unable to attempt stand with pt on OT eval as pt has instance of bowel incontinence and requires MIN/MOD A for rolling and MAX A for peri care with CNA assisting OT for linen removal. Pt would benefit from skilled OT to address noted impairments and functional limitations (see below for any additional details) in order to maximize safety and independence while minimizing falls risk and caregiver burden.  Upon hospital discharge, recommend pt discharge to SNF.    Follow Up  Recommendations  SNF    Equipment Recommendations  Other (comment)(defer to next level of care.)    Recommendations for Other Services       Precautions / Restrictions Precautions Precautions: Fall Restrictions Weight Bearing Restrictions: No      Mobility Bed Mobility Overal bed mobility: Needs Assistance Bed Mobility: Supine to Sit;Sit to Supine     Supine to sit: Mod assist Sit to supine: Mod assist   General bed mobility comments: Mod A for RLE and trunk control  Transfers         General transfer comment: deferred MOD/MAX per PT note    Balance Overall balance assessment: Needs assistance Sitting-balance support: Single extremity supported;Feet supported Sitting balance-Leahy Scale: Poor   Postural control: Right lateral lean Standing balance comment: deferred                           ADL either performed or assessed with clinical judgement   ADL Overall ADL's : Needs assistance/impaired Eating/Feeding: Set up;Sitting   Grooming: Wash/dry hands;Wash/dry face;Set up;Sitting   Upper Body Bathing: Moderate assistance;Sitting   Lower Body Bathing: Maximal assistance;Sitting/lateral leans Lower Body Bathing Details (indicate cue type and reason): unable to stand pt on OT eval-pt has incidence of bowel incontinence in EOB sitting. Upper Body Dressing : Moderate assistance;Sitting   Lower Body Dressing: Maximal assistance;Sitting/lateral leans       Toileting- Clothing Manipulation and Hygiene: Maximal assistance;Bed level Toileting - Clothing Manipulation Details (indicate cue type and reason): MOD A for lateral rolling  at bed level. MAX to total A for peri care with  bowel incontinence             Vision   Additional Comments: unabel to formally assess, poor command following for vision assessment. Mostly tracks appropriately, but some lethargy with minimal eye opening throughout session.     Perception     Praxis      Pertinent  Vitals/Pain Pain Assessment: No/denies pain     Hand Dominance Right   Extremity/Trunk Assessment Upper Extremity Assessment Upper Extremity Assessment: Generalized weakness;RUE deficits/detail;LUE deficits/detail RUE Deficits / Details: R UE weakness, shld flexion 1/3 arc of motion 2/5, elbow 3-/5, grip 3/5 LUE Deficits / Details: shoudler, elbow, grip 3+/5   Lower Extremity Assessment Lower Extremity Assessment: Defer to PT evaluation;Generalized weakness       Communication Communication Communication: No difficulties   Cognition Arousal/Alertness: Lethargic Behavior During Therapy: Flat affect Overall Cognitive Status: No family/caregiver present to determine baseline cognitive functioning                                 General Comments: Pt appropriately follows one step commands with increased processing time required.   General Comments       Exercises Other Exercises Other Exercises: OT Attempts to engage pt in straight L UE straight arm raise while seated EOB for UE and core strengthening, some difficulty with cue following to participate Other Exercises: OT engages pt in education re: role of OT. MIN/MOD reception detected. Pt with MIN/MOD eye opening to attend. Other Exercises: Rolling left/right for bed mobility training and core therex   Shoulder Instructions      Home Living Family/patient expects to be discharged to:: Private residence Living Arrangements: Spouse/significant other(girlfriend) Available Help at Discharge: Friend(s);Available 24 hours/day Type of Home: Apartment Home Access: Stairs to enter Entrance Stairs-Number of Steps: 3 Entrance Stairs-Rails: Left Home Layout: One level     Bathroom Shower/Tub: Producer, television/film/video: Standard     Home Equipment: Cane - single point;Walker - 2 wheels;Bedside commode;Shower seat   Additional Comments: Pt with confusion during OT evaluation. Questionable historian. States  his g/f helps him with bathing and LB dressing and getting to/from commode, but had previously stated he took care of himself.      Prior Functioning/Environment          Comments: Pt states he uses RW for fxl mobility, g/f helps with all IADLs. Pt endorses a few falls over the last year.        OT Problem List: Decreased strength;Decreased range of motion;Decreased activity tolerance;Impaired balance (sitting and/or standing);Decreased coordination;Decreased safety awareness;Decreased knowledge of use of DME or AE;Decreased knowledge of precautions;Impaired UE functional use      OT Treatment/Interventions: Self-care/ADL training;Therapeutic exercise;Energy conservation;DME and/or AE instruction;Therapeutic activities;Patient/family education;Balance training;Cognitive remediation/compensation    OT Goals(Current goals can be found in the care plan section) Acute Rehab OT Goals Patient Stated Goal: none stated Time For Goal Achievement: 08/26/19  OT Frequency: Min 1X/week   Barriers to D/C:            Co-evaluation              AM-PAC OT "6 Clicks" Daily Activity     Outcome Measure   Help from another person taking care of personal grooming?: A Little Help from another person toileting, which includes using toliet, bedpan, or urinal?: A Lot Help from another person bathing (including washing, rinsing, drying)?: A Lot Help from another person  to put on and taking off regular upper body clothing?: A Little Help from another person to put on and taking off regular lower body clothing?: A Lot 6 Click Score: 12   End of Session    Activity Tolerance: Patient limited by lethargy Patient left: in bed;with call bell/phone within reach;with bed alarm set;with nursing/sitter in room(CNA present in room taking VS)  OT Visit Diagnosis: Unsteadiness on feet (R26.81);Muscle weakness (generalized) (M62.81)                Time: 1610-96041444-1510 OT Time Calculation (min): 26  min Charges:  OT General Charges $OT Visit: 1 Visit OT Evaluation $OT Eval Moderate Complexity: 1 Mod OT Treatments $Therapeutic Activity: 8-22 mins  Rejeana Brocklison Vona Whiters, MS, OTR/L ascom [phone] 08/12/19, 3:35 PM

## 2019-08-12 NOTE — Consult Note (Signed)
PHARMACY CONSULT NOTE - FOLLOW UP  Pharmacy Consult for Electrolyte Monitoring and Replacement   Recent Labs: Potassium (mmol/L)  Date Value  08/12/2019 4.0   Magnesium (mg/dL)  Date Value  08/12/2019 2.2   Calcium (mg/dL)  Date Value  08/12/2019 9.1   Albumin (g/dL)  Date Value  08/10/2019 3.3 (L)   Phosphorus (mg/dL)  Date Value  08/12/2019 2.7   Sodium (mmol/L)  Date Value  08/12/2019 127 (L)     Assessment: 73 y.o.malewith medical history significant ofalcohol dependence and hypertension.  Pharmacy has been consulted to monitor and replenish electrolytes.  Pt is at risk for refeeding syndrome per lack of prior oral nutrition.  K = 4.0, Mg = 2.2, Phos = 2.7  Na = 127 - pt is still hyponatremic  Goal of Therapy:  To obtain goal of K+ above 4.0 and Mg+ above 2, Phos wnl's  Plan:  No additional replenishment warranted at this time.  Will consult with hospitalist regarding plan to address hyponatremia  Will recheck, Mg, Phos, and BMP with am labs.  Lu Duffel ,PharmD Clinical Pharmacist 08/12/2019 7:38 AM
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
## 2019-08-12 NOTE — Progress Notes (Signed)
Physical Therapy Treatment Patient Details Name: Javier Robinson MRN: [id number] DOB: May 21, 1946 Today's Date: 08/12/2019    History of Present Illness Per MD H&P: Pt is a 73 y.o. male with medical history significant of alcohol dependence and hypertension brought to ED today via EMS after being found altered by his girlfriend at home.  EMS found patient hypoglycemic, treated en route, recurred in the ED, requiring D10 infusion.  Also with hyponatremia and acute encephalopathy on admission.  Hospital admission 06/30/19 to 07/03/19 for similar presentation. Hypoglycemia which recurred after initial treatment in the ED.  CT head was negative.  Chest xray negative.  Admitted for further evaluation and management of acute encephalopathy, hypoglycemia and hyponatremia.  Placed on CIWA protocol in case of alcohol withdrawal. 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    PT Comments    Pt presented with deficits in strength, transfers, mobility, gait, balance, and activity tolerance.  Pt was lethargic during the session but participated with increased cues for sequencing and processing time.  Pt required significant physical assistance for bed mobility and transfers and was unable to take a step once in standing with a RW.  Pt required +2 mod-max A in standing to prevent posterior and R lateral LOB and leaned heavily backwards into the bed.  With heavy assist pt was able to shift his weight anteriorly but continued to present with R lateral lean.  Pt will benefit from PT services in a SNF setting upon discharge to safely address above deficits for decreased caregiver assistance and eventual return to PLOF.     Follow Up Recommendations  SNF     Equipment Recommendations  None recommended by PT    Recommendations for Other Services       Precautions / Restrictions Precautions Precautions: Fall Restrictions Weight Bearing Restrictions: No    Mobility  Bed Mobility Overal bed mobility: Needs  Assistance Bed Mobility: Supine to Sit;Sit to Supine     Supine to sit: Mod assist Sit to supine: Mod assist   General bed mobility comments: Mod A for RLE and trunk control  Transfers Overall transfer level: Needs assistance Equipment used: Rolling walker (2 wheeled) Transfers: Sit to/from Stand Sit to Stand: Mod assist;Max assist         General transfer comment: Mod-max A to stand and to prevent posterior and R lateral LOB  Ambulation/Gait             General Gait Details: Unable   Stairs             Wheelchair Mobility    Modified Rankin (Stroke Patients Only)       Balance Overall balance assessment: Needs assistance Sitting-balance support: Single extremity supported;Feet supported Sitting balance-Leahy Scale: Poor   Postural control: Right lateral lean Standing balance support: Bilateral upper extremity supported;During functional activity Standing balance-Leahy Scale: Poor Standing balance comment: Mod-max A to prevent LOB in standing                            Cognition Arousal/Alertness: Lethargic Behavior During Therapy: Flat affect Overall Cognitive Status: No family/caregiver present to determine baseline cognitive functioning                                 General Comments: Pt able to follow 1-step commands with extra time and cuing      Exercises Total Joint Exercises  Ankle Circles/Pumps: AROM;Both;5 reps;10 reps Heel Slides: AAROM;Both;10 reps Hip ABduction/ADduction: AAROM;Both;10 reps Straight Leg Raises: AAROM;Both;10 reps Long Arc Quad: AROM;Both;10 reps Other Exercises Other Exercises: Static sitting balance training and core strengthening Other Exercises: Multiple sit to/from stand training from various height surfaces with cues for sequencing Other Exercises: Rolling left/right for bed mobility training and core therex    General Comments        Pertinent Vitals/Pain Pain Assessment:  No/denies pain    Home Living                      Prior Function            PT Goals (current goals can now be found in the care plan section) Progress towards PT goals: Not progressing toward goals - comment(Limited by functional weakness and poor standing balance)    Frequency    Min 2X/week      PT Plan Current plan remains appropriate    Co-evaluation              AM-PAC PT "6 Clicks" Mobility   Outcome Measure  Help needed turning from your back to your side while in a flat bed without using bedrails?: A Lot Help needed moving from lying on your back to sitting on the side of a flat bed without using bedrails?: A Lot Help needed moving to and from a bed to a chair (including a wheelchair)?: A Lot Help needed standing up from a chair using your arms (e.g., wheelchair or bedside chair)?: A Lot Help needed to walk in hospital room?: Total Help needed climbing 3-5 steps with a railing? : Total 6 Click Score: 10    End of Session Equipment Utilized During Treatment: Gait belt Activity Tolerance: Patient tolerated treatment well Patient left: in bed;with call bell/phone within reach;with bed alarm set Nurse Communication: Mobility status;Other (comment)(Strong posterior and right lateral lean in standing and inability to ambulate) PT Visit Diagnosis: Unsteadiness on feet (R26.81);Muscle weakness (generalized) (M62.81);Difficulty in walking, not elsewhere classified (R26.2)     Time: 8413-2440 PT Time Calculation (min) (ACUTE ONLY): 23 min  Charges:  $Therapeutic Exercise: 8-22 mins $Therapeutic Activity: 8-22 mins                     D. Scott Nevayah Faust PT, DPT 08/12/19, 1:13 PM

## 2019-08-12 NOTE — Progress Notes (Signed)
PROGRESS NOTE    Javier Robinson  ZOX:[id number] DOB: 1945-10-05 DOA: 08/09/2019  PCP: Derwood KaplanEason, Ernest B, MD    LOS - 2   Brief Narrative:  73 y.o. male with medical history significant of alcohol dependence and hypertension brought to ED today via EMS after being found altered by his girlfriend at home.  EMS found patient hypoglycemic, treated en route, recurred in the ED, requiring D10 infusion.  Also with hyponatremia and acute encephalopathy on admission.  Hospital admission 06/30/19 to 07/03/19 for similar presentation.  In the ED, hypertensive 170/84, O2 sat 90% on room air, other vitals normal.  Labs notable for Na 132, Cl 93, CO2 21, anion gap 18, total bili 1.4, hs-troponin 21, Hbg 11.9.  Hypoglycemia which recurred after initial treatment in the ED and patient started on D5-1/2NS fluid.  No leukocytosis.  CT head was negative.  Chest xray negative.  Admitted further evaluation and management of acute encephalopathy, hypoglycemia and hyponatremia.  Placed on CIWA protocol in case of alcohol withdrawal.  Subjective 12/23: Patient actually is more awake.  No nausea no vomiting.  Following commands.  Answering questions appropriately.  Asking when he can leave the hospital.  No chest pain or shortness of breath.  No asterixis.  Assessment & Plan: Episode of A-fib, resolved, converted to NSR. --unclear if any hx of a-fib.   --continue telemetry monitoring --EKG if a-fib recurs  --maintain K>4.0 and Mg>2.0  Hypovolemic Hypotonic Hyponatremia - Initially hypovolemic, possilby beer podomania.   Na 132 on admission, 128. Last admission sodium improved with normal saline.   Recheck serum osmolarity and urine osmolarity. Free water restriction. May require nephrology input if does not improve. May require salt tablets if does not improve. BMP every 4 hours.  Hypoglycemia -  resolved.  Suspect due to little PO intake and alcoholism.   Patient not diabetic.  Last Hbg A1c 5.0 on 06/30/19.   Continue CBG's -- stopped d5w-ns  Hypophosphatemia -  concern for refeeding syndrome in alcoholic patient that does not eat.  Treated with oral replacement. --dietician consulted   Increased anion gap (IAG) - due to starvation ketoacidosis. Resolved. Most likely in this patient starvation ketoacidosis, or possibly rhabdo as well.  Not uremic.  Lactate normal.   CK and salicylates normal.  Serum beta-hydroxybutyrate elevated. --monitor BMP  Acute metabolic encephalopathy -  most likely this is due to hyponatremia. Also possibility of hypoglycemia and alcohol abuse playing a role here. Patient was confused on 08/11/2019 likely from withdrawal. Possibility of B1 deficiency causing metabolic encephalopathy cannot be ruled out. CT head negative. No focal deficit at the time of my evaluation.  Exam no asterixis. Continue to trend sodium and correct with free water restriction. May require nephrology input if does not improve  Speech therapy recommends dysphagia diet.  QTc Prolongation - QTc 544 on ECG of 12/21. --keep K>4.0 and Mg>2.0 --monitor on telemetry --avoid QT prolonging agents  Alcohol dependence  --placed on CIWA protocol  Essential hypertension --continue amlodipine increase dose to 5mg  --IV labetalol PRN  Alcohol withdrawal. Monitor on CIWA protocol. IV thiamine.  B12.  Monitor. Low threshold to transfer to ICU.   DVT prophylaxis: Lovenos   Code Status: Full Code  Family Communication: none at bedside, attempted to reach the family twice but unable.  Left voicemails. Disposition Plan:  Expect discharge home, pending further improvement of hyponatremia.  Severity of Illness: The appropriate patient status for this patient is INPATIENT. Inpatient status is judged to be reasonable  and necessary in order to provide the required intensity of service to ensure the patient's safety. The patient's presenting symptoms, physical exam findings, and initial  radiographic and laboratory data in the context of their chronic comorbidities is felt to place them at high risk for further clinical deterioration and refeeding syndrome which can be fatal. Furthermore, it is not anticipated that the patient will be medically stable for discharge from the hospital within 2 midnights of admission. The following factors support the patient status of inpatient.    "           The patient's presenting symptoms include altered mental status. "           The worrisome physical exam findings include altered mental status. "           The laboratory data are worrisome because of hyponatremia, hypophosphatemia and metabolic acidosis. "           The chronic co-morbidities include alcohol dependence, malnutrition, hypertension, diastolic CHF, QT prolongation.     * I certify that at the point of admission it is my clinical judgment that the patient will require inpatient hospital care spanning beyond 2 midnights from the point of admission due to high intensity of service, high risk for further deterioration and high frequency of surveillance required.*  Consultants:   None  Procedures:   Echo 12/22 - EF 55-60%, mild LVH  Antimicrobials:   None   Objective: Vitals:   08/12/19 0428 08/12/19 0441 08/12/19 0833 08/12/19 1509  BP: 139/84  (!) 152/71 100/62  Pulse: (!) 104 95 75 85  Resp: Temp: 98.7 F (37.1 C)  97.8 F (36.6 C) 98.1 F (36.7 C)  TempSrc: Oral  Oral Oral  SpO2: (!) 87% 95% 98% 97%  Weight:      Height:        Intake/Output Summary (Last 24 hours) at 08/12/2019 1637 Last data filed at 08/12/2019 1040 Gross per 24 hour  Intake 240 ml  Output 1200 ml  Net -960 ml   Filed Weights   08/09/19 1510  Weight: 71 kg    Examination:  General exam: more alert today, still appears drowsy, no acute distress HEENT: moist mucus membranes, hearing grossly normal  Respiratory system: diminished but clear bilaterally, no wheezes,  rales or rhonchi, normal respiratory effort. Cardiovascular system: normal S1/S2, RRR, no JVD, murmurs, rubs, gallops, no pedal edema.   Gastrointestinal system: soft, non-tender, non-distended abdomen Central nervous system: alert and oriented x3. no gross focal neurologic deficits.  Speech is not slurred but difficult to understand which is apparently baseline Extremities: moves all, no edema, normal tone Skin: dry, intact, normal temperature Psychiatry: normal mood, congruent affect, abnormal judgement and insight     Data Reviewed: I have personally reviewed following labs and imaging studies  CBC: Recent Labs  Lab 08/09/19 1525 08/09/19 1805 08/10/19 0340  WBC 6.2 6.5 4.4  NEUTROABS 4.4  --   --   HGB 11.9* 12.0* 11.0*  HCT 36.4* 36.4* 32.3*  MCV 97.6 98.1 95.0  PLT 207 203 213   Basic Metabolic Panel: Recent Labs  Lab 08/09/19 1805 08/09/19 1806 08/10/19 0340 08/10/19 1705 08/11/19 0856 08/12/19 0446 08/12/19 1239  NA 129*  --  128* 130* 130* 127* 124*  K 3.6  --  3.9  --  3.6 4.0 3.9  CL 92*  --  94*  --  96* 93* 93*  CO2 22  --  23  --  25 20* 22  GLUCOSE 135*  --  112*  --  110* 88 137*  BUN 8  --  6*  --  <5* <5* <5*  CREATININE 0.58*  --  0.41*  --  0.58* 0.45* 0.63  CALCIUM 8.9  --  8.9  --  8.8* 9.1 9.1  MG  --  1.8 2.2  --  1.8 2.2  --   PHOS  --  2.9 2.3*  --  1.6* 2.7  --    GFR: Estimated Creatinine Clearance: 79.8 mL/min (by C-G formula based on SCr of 0.63 mg/dL). Liver Function Tests: Recent Labs  Lab 08/09/19 1525 08/09/19 1805 08/10/19 0340  AST 24 22 19   ALT 8 9 8   ALKPHOS 65 65 60  BILITOT 1.4* 1.3* 1.3*  PROT 8.5* 7.9 7.6  ALBUMIN 3.7 3.5 3.3*   No results for input(s): LIPASE, AMYLASE in the last 168 hours. Recent Labs  Lab 08/11/19 1118  AMMONIA 30   Coagulation Profile: Recent Labs  Lab 08/10/19 0340  INR 1.0   Cardiac Enzymes: Recent Labs  Lab 08/09/19 1805  CKTOTAL 103   BNP (last 3 results) No results for  input(s): PROBNP in the last 8760 hours. HbA1C: No results for input(s): HGBA1C in the last 72 hours. CBG: Recent Labs  Lab 08/09/19 2235 08/10/19 0010 08/10/19 0206 08/10/19 0344 08/10/19 0613  GLUCAP 77 90 112* 114* 117*   Lipid Profile: No results for input(s): CHOL, HDL, LDLCALC, TRIG, CHOLHDL, LDLDIRECT in the last 72 hours. Thyroid Function Tests: Recent Labs    08/10/19 0047 08/11/19 1103  TSH 2.230 5.145*  T4TOTAL 4.3*  --   FREET4  --  1.07   Anemia Panel: No results for input(s): VITAMINB12, FOLATE, FERRITIN, TIBC, IRON, RETICCTPCT in the last 72 hours. Sepsis Labs: Recent Labs  Lab 08/09/19 1526 08/09/19 1805  LATICACIDVEN 1.5 1.7    Recent Results (from the past 240 hour(s))  SARS CORONAVIRUS 2 (TAT 6-24 HRS) Nasopharyngeal Nasopharyngeal Swab     Status: None   Collection Time: 08/09/19  6:05 PM   Specimen: Nasopharyngeal Swab  Result Value Ref Range Status   SARS Coronavirus 2 NEGATIVE NEGATIVE Final    Comment: (NOTE) SARS-CoV-2 target nucleic acids are NOT DETECTED. The SARS-CoV-2 RNA is generally detectable in upper and lower respiratory specimens during the acute phase of infection. Negative results do not preclude SARS-CoV-2 infection, do not rule out co-infections with other pathogens, and should not be used as the sole basis for treatment or other patient management decisions. Negative results must be combined with clinical observations, patient history, and epidemiological information. The expected result is Negative. Fact Sheet for Patients: SugarRoll.be Fact Sheet for Healthcare Providers: https://www.woods-mathews.com/ This test is not yet approved or cleared by the [place] FDA and  has been authorized for detection and/or diagnosis of SARS-CoV-2 by FDA under an Emergency Use Authorization (EUA). This EUA will remain  in effect (meaning this test can be used) for the duration of  the COVID-19 declaration under Section 56 4(b)(1) of the Act, 21 U.S.C. section 360bbb-3(b)(1), unless the authorization is terminated or revoked sooner. Performed at [facility], [place] [address] [place], [place] [address]          Radiology Studies: DG Chest Port 1 View  Result Date: 08/11/2019 CLINICAL DATA:  Shortness of breath, wheezing EXAM: PORTABLE CHEST 1 VIEW COMPARISON:  08/09/2019 FINDINGS: Peribronchial thickening. No confluent airspace opacities or effusions. Heart  is upper limits normal in size. No acute bony abnormality. IMPRESSION: Bronchitic changes. Electronically Signed   By: Charlett Nose M.D.   On: 08/11/2019 11:03        Scheduled Meds: . dextromethorphan-guaiFENesin  1 tablet Oral BID  . doxycycline  100 mg Oral Q12H  . enoxaparin (LOVENOX) injection  40 mg Subcutaneous Q24H  . feeding supplement (ENSURE ENLIVE)  237 mL Oral TID BM  . folic acid  1 mg Oral Daily  . hydrALAZINE  25 mg Oral Q8H  . ipratropium-albuterol  3 mL Nebulization BID  . lactulose  20 g Oral BID  . multivitamin with minerals  1 tablet Oral Daily  . pantoprazole  40 mg Oral Q0600  . prednisoLONE acetate  1 drop Left Eye TID  . sodium chloride flush  10-40 mL Intracatheter Q12H  . vitamin B-12  1,000 mcg Oral Daily   Continuous Infusions: . thiamine injection       LOS: 2 days    Time spent: 35-40 minutes    Lynden Oxford, DO [facility]   If 7PM-7AM, please contact night-coverage [email or web address] Password [facility] 08/12/2019, 4:37 PM

## 2019-08-13 LAB — BASIC METABOLIC PANEL
Anion gap: 10 (ref 5–15)
Anion gap: 10 (ref 5–15)
Anion gap: 10 (ref 5–15)
Anion gap: 8 (ref 5–15)
Anion gap: 8 (ref 5–15)
Anion gap: 9 (ref 5–15)
BUN: 10 mg/dL (ref 8–23)
BUN: 10 mg/dL (ref 8–23)
BUN: 8 mg/dL (ref 8–23)
BUN: 8 mg/dL (ref 8–23)
BUN: 9 mg/dL (ref 8–23)
BUN: 9 mg/dL (ref 8–23)
CO2: 21 mmol/L — ABNORMAL LOW (ref 22–32)
CO2: 22 mmol/L (ref 22–32)
CO2: 22 mmol/L (ref 22–32)
CO2: 23 mmol/L (ref 22–32)
CO2: 23 mmol/L (ref 22–32)
CO2: 24 mmol/L (ref 22–32)
Calcium: 9 mg/dL (ref 8.9–10.3)
Calcium: 9 mg/dL (ref 8.9–10.3)
Calcium: 9.1 mg/dL (ref 8.9–10.3)
Calcium: 9.2 mg/dL (ref 8.9–10.3)
Calcium: 9.3 mg/dL (ref 8.9–10.3)
Calcium: 9.4 mg/dL (ref 8.9–10.3)
Chloride: 93 mmol/L — ABNORMAL LOW (ref 98–111)
Chloride: 94 mmol/L — ABNORMAL LOW (ref 98–111)
Chloride: 94 mmol/L — ABNORMAL LOW (ref 98–111)
Chloride: 96 mmol/L — ABNORMAL LOW (ref 98–111)
Chloride: 96 mmol/L — ABNORMAL LOW (ref 98–111)
Chloride: 97 mmol/L — ABNORMAL LOW (ref 98–111)
Creatinine, Ser: 0.52 mg/dL — ABNORMAL LOW (ref 0.61–1.24)
Creatinine, Ser: 0.54 mg/dL — ABNORMAL LOW (ref 0.61–1.24)
Creatinine, Ser: 0.57 mg/dL — ABNORMAL LOW (ref 0.61–1.24)
Creatinine, Ser: 0.61 mg/dL (ref 0.61–1.24)
Creatinine, Ser: 0.64 mg/dL (ref 0.61–1.24)
Creatinine, Ser: 0.64 mg/dL (ref 0.61–1.24)
GFR calc Af Amer: >60 mL/min (ref >60)
GFR calc Af Amer: >60 mL/min (ref >60)
GFR calc Af Amer: >60 mL/min (ref >60)
GFR calc Af Amer: >60 mL/min (ref >60)
GFR calc Af Amer: >60 mL/min (ref >60)
GFR calc Af Amer: >60 mL/min (ref >60)
GFR calc non Af Amer: >60 mL/min (ref >60)
GFR calc non Af Amer: >60 mL/min (ref >60)
GFR calc non Af Amer: >60 mL/min (ref >60)
GFR calc non Af Amer: >60 mL/min (ref >60)
GFR calc non Af Amer: >60 mL/min (ref >60)
GFR calc non Af Amer: >60 mL/min (ref >60)
Glucose, Bld: 100 mg/dL — ABNORMAL HIGH (ref 70–99)
Glucose, Bld: 101 mg/dL — ABNORMAL HIGH (ref 70–99)
Glucose, Bld: 106 mg/dL — ABNORMAL HIGH (ref 70–99)
Glucose, Bld: 126 mg/dL — ABNORMAL HIGH (ref 70–99)
Glucose, Bld: 99 mg/dL (ref 70–99)
Glucose, Bld: 99 mg/dL (ref 70–99)
Potassium: 4.1 mmol/L (ref 3.5–5.1)
Potassium: 4.1 mmol/L (ref 3.5–5.1)
Potassium: 4.2 mmol/L (ref 3.5–5.1)
Potassium: 4.2 mmol/L (ref 3.5–5.1)
Potassium: 4.3 mmol/L (ref 3.5–5.1)
Potassium: 4.3 mmol/L (ref 3.5–5.1)
Sodium: 125 mmol/L — ABNORMAL LOW (ref 135–145)
Sodium: 125 mmol/L — ABNORMAL LOW (ref 135–145)
Sodium: 127 mmol/L — ABNORMAL LOW (ref 135–145)
Sodium: 127 mmol/L — ABNORMAL LOW (ref 135–145)
Sodium: 128 mmol/L — ABNORMAL LOW (ref 135–145)
Sodium: 128 mmol/L — ABNORMAL LOW (ref 135–145)

## 2019-08-13 LAB — CBC
HCT: 28.8 % — ABNORMAL LOW (ref 39.0–52.0)
Hemoglobin: 9.7 g/dL — ABNORMAL LOW (ref 13.0–17.0)
MCH: 32.2 pg (ref 26.0–34.0)
MCHC: 33.7 g/dL (ref 30.0–36.0)
MCV: 95.7 fL (ref 80.0–100.0)
Platelets: 205 10*3/uL (ref 150–400)
RBC: 3.01 MIL/uL — ABNORMAL LOW (ref 4.22–5.81)
RDW: 18.1 % — ABNORMAL HIGH (ref 11.5–15.5)
WBC: 4.2 10*3/uL (ref 4.0–10.5)
nRBC: 0 % (ref 0.0–0.2)

## 2019-08-13 LAB — PROTIME-INR
INR: 1 (ref 0.8–1.2)
Prothrombin Time: 13 seconds (ref 11.4–15.2)

## 2019-08-13 LAB — PHOSPHORUS: Phosphorus: 3.2 mg/dL (ref 2.5–4.6)

## 2019-08-13 LAB — MAGNESIUM: Magnesium: 1.9 mg/dL (ref 1.7–2.4)

## 2019-08-13 MED ORDER — LORAZEPAM 2 MG/ML IJ SOLN: 0.0000 mg | Freq: Two times a day (BID) | INTRAMUSCULAR | Status: AC

## 2019-08-13 MED ORDER — SODIUM CHLORIDE 1 G PO TABS
2.0000 g | ORAL_TABLET | Freq: Three times a day (TID) | ORAL | Status: DC
  Administered 2019-08-13 – 2019-08-14 (×5): 2 g via ORAL
  Filled 2019-08-13 (×6): qty 2

## 2019-08-13 MED ORDER — LORAZEPAM 2 MG/ML IJ SOLN: 1.0000 mg | INTRAMUSCULAR | Status: AC | PRN

## 2019-08-13 MED ORDER — LORAZEPAM 2 MG/ML IJ SOLN: 0.0000 mg | Freq: Four times a day (QID) | INTRAMUSCULAR | Status: AC

## 2019-08-13 MED ORDER — LORAZEPAM 1 MG PO TABS: 1.0000 mg | ORAL_TABLET | ORAL | Status: AC | PRN

## 2019-08-13 NOTE — Consult Note (Signed)
PHARMACY CONSULT NOTE - FOLLOW UP  Pharmacy Consult for Electrolyte Monitoring and Replacement   Recent Labs: Potassium (mmol/L)  Date Value  08/13/2019 4.3   Magnesium (mg/dL)  Date Value  08/13/2019 1.9   Calcium (mg/dL)  Date Value  08/13/2019 9.3   Albumin (g/dL)  Date Value  08/10/2019 3.3 (L)   Phosphorus (mg/dL)  Date Value  08/13/2019 3.2   Sodium (mmol/L)  Date Value  08/13/2019 125 (L)     Assessment: 73 y.o.malewith medical history significant ofalcohol dependence and hypertension.  Pharmacy has been consulted to monitor and replenish electrolytes.  Pt is at risk for refeeding syndrome per lack of prior oral nutrition.  Na = 125 - pt is still hyponatremic  Goal of Therapy:  To obtain goal of K+ above 4.0 and Mg+ above 2, Phos wnl's  Plan:  No additional replenishment warranted at this time.  Will recheck, Mg, and BMP with am labs.  Oswald Hillock ,PharmD, BCPS Clinical Pharmacist 08/13/2019 7:57 AM

## 2019-08-13 NOTE — Progress Notes (Addendum)
[facility] Progress Note  Patient: Javier Robinson RXV:[id number]   PCP: Derwood Kaplan, MD DOB: 03-Jul-1946   DOA: 08/09/2019   DOS: 08/13/2019   Date of Service: the patient was seen and examined on 08/13/2019  Chief Complaint  Patient presents with  . Altered Mental Status  . Hypoglycemia   Brief hospital course: 73 y.o.malewith medical history significant ofalcohol dependence and hypertension brought to ED today via EMS after being found altered by his girlfriend at home.  EMS found patient hypoglycemic, treated en route, recurred in the ED, requiring D10 infusion.  Also with hyponatremia and acute encephalopathy on admission.  Hospital admission 06/30/19 to 07/03/19 for similar presentation.  In the ED, hypertensive 170/84, O2 sat 90% on room air, other vitals normal. Labs notable for Na 132, Cl 93, CO2 21, anion gap 18, total bili 1.4, hs-troponin 21, Hbg 11.9. Hypoglycemia which recurred after initial treatment in the ED and patient started on D5-1/2NS fluid. No leukocytosis. CT head was negative. Chest xray negative.  Admitted further evaluation and management of acute encephalopathy, hypoglycemia and hyponatremia.  Placed on CIWA protocol in case of alcohol withdrawal.  Currently further plan is continue treating pneumonia and hyponatremia.  Subjective: awake and alert, no acute complains, has some cough as well as resting tremors that resolved when I walked in. No chest pain no abdominal pain, no fever  Assessment and Plan: Scheduled Meds: . dextromethorphan-guaiFENesin  1 tablet Oral BID  . doxycycline  100 mg Oral Q12H  . enoxaparin (LOVENOX) injection  40 mg Subcutaneous Q24H  . feeding supplement (ENSURE ENLIVE)  237 mL Oral TID BM  . folic acid  1 mg Oral Daily  . hydrALAZINE  25 mg Oral Q8H  . ipratropium-albuterol  3 mL Nebulization BID  . lactulose  20 g Oral BID  . multivitamin with minerals  1 tablet Oral Daily  . pantoprazole  40 mg Oral Q0600  .  prednisoLONE acetate  1 drop Left Eye TID  . sodium chloride flush  10-40 mL Intracatheter Q12H  . sodium chloride  2 g Oral TID WC  . vitamin B-12  1,000 mcg Oral Daily   Continuous Infusions: . thiamine injection Stopped (08/12/19 1826)   PRN Meds: acetaminophen **OR** acetaminophen, bisacodyl, labetalol, polyethylene glycol, sodium chloride flush   Hypovolemic Hypotonic Hyponatremia Initially hypovolemic, given IVF possilby beer podomania.  Na 132 on admission Last admission sodium improved with normal saline.   Serum osmolarity 264 Urine osmolarity 638 Urine sodium 165 Free water restriction. May require nephrology input if does not improve. Add salt tablets if does not improve. BMP every 4 hours.  Acute metabolic encephalopathy most likely this is due to hyponatremia. Also possibility of hypoglycemia and alcohol abuse playing a role here. Patient was confused on 08/11/2019 likely from withdrawal. Possibility of B1 deficiency causing metabolic encephalopathy cannot be ruled out. CT head negative. No focal deficit at the time of my evaluation.  Exam no asterixis. Continue to trend sodium and correct with free water restriction. May require nephrology input if does not improve  Speech therapy recommends dysphagia diet.  Acute bronchitis. Patient does have some bilateral expiratory wheezing as well as some faint crackles. Also has some cough without any expectoration. Chest x-ray shows peribronchial thickening concerning for acute bronchitis. Currently on room air. Doxycycline, Mucinex, DuoNeb's added.  Hypoglycemia Resolved.  Suspect due to little PO intake and alcoholism.  Patient not diabetic.  Last Hbg A1c 5.0 on 06/30/19.  Continue CBG's  Hypophosphatemia In the setting refeeding syndrome in alcoholic patient that does not eat.  Treated with oral replacement. Dietician consulted   Increased anion gap (IAG) resolved Due to starvation  ketoacidosis. Most likely in this patient starvation ketoacidosis, or possibly rhabdo as well. Not uremic.  Lactate normal.   CK and salicylates normal. Serum beta-hydroxybutyrate elevated. monitor BMP  QTc Prolongation - QTc 544 on ECG of 12/21. keep K>4.0 and Mg>2.0 monitor on telemetry avoid QT prolonging agents  Essential hypertension continue amlodipine increase dose to 5mg  IV labetalol PRN  Episode of A-fib, resolved, converted to NSR. unclear if any hx of a-fib.   continue telemetry monitoring EKG if a-fib recurs  maintain K>4.0 and Mg>2.0  Moderate malnutrition  Body mass index is 23.11 kg/m.  Nutrition Problem: Moderate Malnutrition Etiology: social / environmental circumstances(EtOH abuse, inadequate oral intake) Interventions: Interventions: Ensure Enlive (each supplement provides 350kcal and 20 grams of protein), MVI  Diet: Cardiac diet  DVT Prophylaxis: Subcutaneous Lovenox   Advance goals of care discussion: Full code  Family Communication: no family was present at bedside, at the time of interview. The pt provided permission to discuss medical plan with the family. Opportunity was given to ask question and all questions were answered satisfactorily.   Disposition:  Discharge to be determined.  Consultants: none Procedures: none  Antibiotics: Anti-infectives (From admission, onward)   Start     Dose/Rate Route Frequency Ordered Stop   08/12/19 1000  doxycycline (VIBRA-TABS) tablet 100 mg     100 mg Oral Every 12 hours 08/12/19 0914         Objective: Physical Exam: Vitals:   08/12/19 2000 08/13/19 0346 08/13/19 0627 08/13/19 0816  BP:  114/65 119/73 (!) 158/75  Pulse:  79 79 97  Resp:  17  18  Temp:  98.5 F (36.9 C)  98.8 F (37.1 C)  TempSrc:  Oral  Oral  SpO2: 94% 100%  95%  Weight:      Height:        Intake/Output Summary (Last 24 hours) at 08/13/2019 1002 Last data filed at 08/13/2019 0355 Gross per 24 hour  Intake 890 ml   Output 850 ml  Net 40 ml   Filed Weights   08/09/19 1510  Weight: 71 kg   General: alert and oriented to time, place, and person. Appear in mild distress, affect flat in affect Eyes: PERRL, Conjunctiva normal ENT: Oral Mucosa Clear, moist  Neck: no JVD, no Abnormal Mass Or lumps Cardiovascular: S1 and S2 Present, no Murmur,  Respiratory: increased respiratory effort, Bilateral Air entry equal and Decreased, no signs of accessory muscle use, bilateral  Crackles, Occasional  wheezes Abdomen: Bowel Sound present, Soft and no tenderness, no hernia Skin: no rashes  Extremities: no Pedal edema, no calf tenderness Neurologic: mental status, alert and oriented x3, speech normal, PERLA, Motor strength 5/5 and symmetric and Asterixis absent Gait not checked due to patient safety concerns  Data Reviewed: I have personally reviewed and interpreted daily labs, tele strips, imagings as discussed above. I reviewed all nursing notes, pharmacy notes, vitals, pertinent old records I have discussed plan of care as described above with RN and patient/family.  CBC: Recent Labs  Lab 08/09/19 1525 08/09/19 1805 08/10/19 0340 08/13/19 0414  WBC 6.2 6.5 4.4 4.2  NEUTROABS 4.4  --   --   --   HGB 11.9* 12.0* 11.0* 9.7*  HCT 36.4* 36.4* 32.3* 28.8*  MCV 97.6 98.1 95.0 95.7  PLT 207 203 213  694   Basic Metabolic Panel: Recent Labs  Lab 08/09/19 1806 08/10/19 0340 08/11/19 0856 08/12/19 0446 08/12/19 1239 08/12/19 1613 08/12/19 2024 08/13/19 0011 08/13/19 0414  NA  --  128* 130* 127* 124* 126* 126* 125* 125*  K  --  3.9 3.6 4.0 3.9 4.1 4.1 4.1 4.3  CL  --  94* 96* 93* 93* 94* 93* 94* 93*  CO2  --  23 25 20* 22 23 24 23 22   GLUCOSE  --  112* 110* 88 137* 93 117* 126* 99  BUN  --  6* <5* <5* <5* 6* 6* 8 9  CREATININE  --  0.41* 0.58* 0.45* 0.63 0.52* 0.63 0.64 0.64  CALCIUM  --  8.9 8.8* 9.1 9.1 9.1 9.2 9.1 9.3  MG 1.8 2.2 1.8 2.2  --   --   --   --  1.9  PHOS 2.9 2.3* 1.6* 2.7  --   --    --   --  3.2    Liver Function Tests: Recent Labs  Lab 08/09/19 1525 08/09/19 1805 08/10/19 0340  AST 24 22 19   ALT 8 9 8   ALKPHOS 65 65 60  BILITOT 1.4* 1.3* 1.3*  PROT 8.5* 7.9 7.6  ALBUMIN 3.7 3.5 3.3*   No results for input(s): LIPASE, AMYLASE in the last 168 hours. Recent Labs  Lab 08/11/19 1118  AMMONIA 30   Coagulation Profile: Recent Labs  Lab 08/10/19 0340 08/13/19 0414  INR 1.0 1.0   Cardiac Enzymes: Recent Labs  Lab 08/09/19 1805  CKTOTAL 103   BNP (last 3 results) No results for input(s): PROBNP in the last 8760 hours. CBG: Recent Labs  Lab 08/09/19 2235 08/10/19 0010 08/10/19 0206 08/10/19 0344 08/10/19 0613  GLUCAP 77 90 112* 114* 117*   Studies: No results found.   Time spent: 35 minutes  Author: Berle Mull, MD [facility] 08/13/2019 10:02 AM  To reach On-call, see care teams to locate the attending and reach out to them via [email or web address]. If 7PM-7AM, please contact night-coverage If you still have difficulty reaching the attending provider, please page the [facility] (Director on Call) for [facility] on amion for assistance.

## 2019-08-13 NOTE — Plan of Care (Signed)

## 2019-08-14 DIAGNOSIS — F1029 Alcohol dependence with unspecified alcohol-induced disorder: Secondary | ICD-10-CM

## 2019-08-14 DIAGNOSIS — I10 Essential (primary) hypertension: Secondary | ICD-10-CM

## 2019-08-14 LAB — CBC
HCT: 27 % — ABNORMAL LOW (ref 39.0–52.0)
Hemoglobin: 9.5 g/dL — ABNORMAL LOW (ref 13.0–17.0)
MCH: 32.3 pg (ref 26.0–34.0)
MCHC: 35.2 g/dL (ref 30.0–36.0)
MCV: 91.8 fL (ref 80.0–100.0)
Platelets: 243 10*3/uL (ref 150–400)
RBC: 2.94 MIL/uL — ABNORMAL LOW (ref 4.22–5.81)
RDW: 18.6 % — ABNORMAL HIGH (ref 11.5–15.5)
WBC: 4.1 10*3/uL (ref 4.0–10.5)
nRBC: 0 % (ref 0.0–0.2)

## 2019-08-14 LAB — BASIC METABOLIC PANEL
Anion gap: 10 (ref 5–15)
BUN: 10 mg/dL (ref 8–23)
CO2: 22 mmol/L (ref 22–32)
Calcium: 8.9 mg/dL (ref 8.9–10.3)
Chloride: 96 mmol/L — ABNORMAL LOW (ref 98–111)
Creatinine, Ser: 0.56 mg/dL — ABNORMAL LOW (ref 0.61–1.24)
GFR calc Af Amer: >60 mL/min (ref >60)
GFR calc non Af Amer: >60 mL/min (ref >60)
Glucose, Bld: 95 mg/dL (ref 70–99)
Potassium: 4.3 mmol/L (ref 3.5–5.1)
Sodium: 128 mmol/L — ABNORMAL LOW (ref 135–145)

## 2019-08-14 LAB — MAGNESIUM: Magnesium: 1.7 mg/dL (ref 1.7–2.4)

## 2019-08-14 MED ORDER — SODIUM CHLORIDE 0.9 % IV SOLN: INTRAVENOUS | Status: DC

## 2019-08-14 MED ORDER — MAGNESIUM SULFATE 2 GM/50ML IV SOLN
2.0000 g | Freq: Once | INTRAVENOUS | Status: AC
  Administered 2019-08-14: 09:00:00 2 g via INTRAVENOUS
  Filled 2019-08-14: qty 50

## 2019-08-14 NOTE — Plan of Care (Signed)

## 2019-08-14 NOTE — Consult Note (Signed)
PHARMACY CONSULT NOTE - FOLLOW UP  Pharmacy Consult for Electrolyte Monitoring and Replacement   Recent Labs: Potassium (mmol/L)  Date Value  08/14/2019 4.3   Magnesium (mg/dL)  Date Value  08/14/2019 1.7   Calcium (mg/dL)  Date Value  08/14/2019 8.9   Albumin (g/dL)  Date Value  08/10/2019 3.3 (L)   Phosphorus (mg/dL)  Date Value  08/13/2019 3.2   Sodium (mmol/L)  Date Value  08/14/2019 128 (L)     Assessment: 73 y.o.malewith medical history significant ofalcohol dependence and hypertension.  Pharmacy has been consulted to monitor and replenish electrolytes.  Pt is at risk for refeeding syndrome per lack of prior oral nutrition.   Goal of Therapy:  To obtain goal of K+ above 4.0 and Mg+ above 2, Phos wnl's  Plan:  K 4.3  Mag 1.7  Scr 0.56  Na 128  Will order Magnesium sulfate 2 gm IV x 1  Will recheck, Mg, and BMP with am labs.  Noralee Space ,PharmD, BCPS Clinical Pharmacist 08/14/2019 9:09 AM

## 2019-08-14 NOTE — Progress Notes (Signed)
Physical Therapy Treatment Patient Details Name: Javier Robinson MRN: [id number] DOB: 1946-05-06 Today's Date: 08/14/2019    History of Present Illness Per MD H&P: Pt is a 73 y.o. male with medical history significant of alcohol dependence and hypertension brought to ED today via EMS after being found altered by his girlfriend at home.  EMS found patient hypoglycemic, treated en route, recurred in the ED, requiring D10 infusion.  Also with hyponatremia and acute encephalopathy on admission.  Hospital admission 06/30/19 to 07/03/19 for similar presentation. Hypoglycemia which recurred after initial treatment in the ED.  CT head was negative.  Chest xray negative.  Admitted for further evaluation and management of acute encephalopathy, hypoglycemia and hyponatremia.  Placed on CIWA protocol in case of alcohol withdrawal. 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    PT Comments    In bed, leaning to right.  Agrees to session.  To edge of bed with min a x 1 and encouragement.  Once sitting, he is able to sit unsupported today for short periods of time without assist.  He is able to stand with min/mod a x 1 and march in place.  Progresses to stepping to chair with walker and mod a x 1.  Right lean in standing which requires support to prevent fall.  Once seated completes sitting ex x 10,  Minimal verbalizations during session but works hard and puts good effort into session.  Remained in recliner.   Follow Up Recommendations  SNF     Equipment Recommendations  None recommended by PT    Recommendations for Other Services       Precautions / Restrictions Precautions Precautions: Fall Restrictions Weight Bearing Restrictions: No    Mobility  Bed Mobility Overal bed mobility: Needs Assistance Bed Mobility: Supine to Sit     Supine to sit: Min assist;Mod assist        Transfers Overall transfer level: Needs assistance Equipment used: Rolling walker (2 wheeled) Transfers: Sit to/from  Stand Sit to Stand: Mod assist            Ambulation/Gait Ambulation/Gait assistance: Mod assist Gait Distance (Feet): 3 Feet Assistive device: Rolling walker (2 wheeled) Gait Pattern/deviations: Step-to pattern;Decreased step length - right;Decreased step length - left;Narrow base of support Gait velocity: decreased   General Gait Details: right lean but able to step to recliner with mod a x 1.  no buckling noted but lean increases fall risk.   Stairs             Wheelchair Mobility    Modified Rankin (Stroke Patients Only)       Balance Overall balance assessment: Needs assistance Sitting-balance support: Single extremity supported;Feet supported Sitting balance-Leahy Scale: Fair Sitting balance - Comments: able to sit unsupported for short periods of time today with no lean or LOB   Standing balance support: Bilateral upper extremity supported;During functional activity Standing balance-Leahy Scale: Poor Standing balance comment: right lean and assist to remain upright                            Cognition Arousal/Alertness: Awake/alert Behavior During Therapy: Flat affect Overall Cognitive Status: No family/caregiver present to determine baseline cognitive functioning                                 General Comments: Pt appropriately follows one step commands with increased processing time required.  Exercises Other Exercises Other Exercises: marching in standing x 10, seated LAQ, marches, ab/add x 10    General Comments        Pertinent Vitals/Pain Pain Assessment: No/denies pain    Home Living                      Prior Function            PT Goals (current goals can now be found in the care plan section) Progress towards PT goals: Progressing toward goals    Frequency    Min 2X/week      PT Plan Current plan remains appropriate    Co-evaluation              AM-PAC PT "6 Clicks"  Mobility   Outcome Measure  Help needed turning from your back to your side while in a flat bed without using bedrails?: A Little Help needed moving from lying on your back to sitting on the side of a flat bed without using bedrails?: A Little Help needed moving to and from a bed to a chair (including a wheelchair)?: A Lot Help needed standing up from a chair using your arms (e.g., wheelchair or bedside chair)?: A Lot Help needed to walk in hospital room?: A Lot Help needed climbing 3-5 steps with a railing? : Total 6 Click Score: 13    End of Session Equipment Utilized During Treatment: Gait belt Activity Tolerance: Patient tolerated treatment well Patient left: in chair;with call bell/phone within reach;with chair alarm set;with nursing/sitter in room Nurse Communication: Mobility status       Time: 2585-2778 PT Time Calculation (min) (ACUTE ONLY): 12 min  Charges:  $Therapeutic Exercise: 8-22 mins                    Chesley Noon, PTA 08/14/19, 10:31 AM

## 2019-08-14 NOTE — Progress Notes (Signed)
[facility] Progress Note  Patient: Javier Robinson E Gindlesperger ZOX:[id number]   PCP: Derwood KaplanEason, Ernest B, MD DOB: Sep 12, 1945   DOA: 08/09/2019   DOS: 08/14/2019   Date of Service: the patient was seen and examined on 08/14/2019  Chief Complaint  Patient presents with  . Altered Mental Status  . Hypoglycemia   Brief hospital course: 73 y.o.malewith medical history significant ofalcohol dependence and hypertension brought to ED today via EMS after being found altered by his girlfriend at home.  EMS found patient hypoglycemic, treated en route, recurred in the ED, requiring D10 infusion.  Also with hyponatremia and acute encephalopathy on admission.  Hospital admission 06/30/19 to 07/03/19 for similar presentation.  In the ED, hypertensive 170/84, O2 sat 90% on room air, other vitals normal. Labs notable for Na 132, Cl 93, CO2 21, anion gap 18, total bili 1.4, hs-troponin 21, Hbg 11.9. Hypoglycemia which recurred after initial treatment in the ED and patient started on D5-1/2NS fluid. No leukocytosis. CT head was negative. Chest xray negative.  Admitted further evaluation and management of acute encephalopathy, hypoglycemia and hyponatremia.  Placed on CIWA protocol in case of alcohol withdrawal.  Currently further plan is continue treating pneumonia and hyponatremia.  Subjective: awake and alert, no acute complains.  He would like to go home  Assessment and Plan: Scheduled Meds: . dextromethorphan-guaiFENesin  1 tablet Oral BID  . doxycycline  100 mg Oral Q12H  . enoxaparin (LOVENOX) injection  40 mg Subcutaneous Q24H  . feeding supplement (ENSURE ENLIVE)  237 mL Oral TID BM  . folic acid  1 mg Oral Daily  . hydrALAZINE  25 mg Oral Q8H  . ipratropium-albuterol  3 mL Nebulization BID  . lactulose  20 g Oral BID  . LORazepam  0-4 mg Intravenous Q6H   Followed by  . [START ON 08/15/2019] LORazepam  0-4 mg Intravenous Q12H  . multivitamin with minerals  1 tablet Oral Daily  . pantoprazole  40  mg Oral Q0600  . prednisoLONE acetate  1 drop Left Eye TID  . sodium chloride flush  10-40 mL Intracatheter Q12H  . sodium chloride  2 g Oral TID WC  . vitamin B-12  1,000 mcg Oral Daily   Continuous Infusions: . thiamine injection 500 mg (08/13/19 1759)   PRN Meds: acetaminophen **OR** acetaminophen, bisacodyl, labetalol, LORazepam **OR** LORazepam, polyethylene glycol, sodium chloride flush   Hypovolemic Hypotonic Hyponatremia possilby beer podomania.  Sodium 128 for last 2 days on sodium tablets, will give him normal saline overnight  Acute metabolic encephalopathy -multifactorial most likely this is due to hyponatremia. Also possibility of hypoglycemia and alcohol abuse playing a role here. Patient was confused on 08/11/2019 likely from withdrawal. Possibility of B1 deficiency causing metabolic encephalopathy cannot be ruled out. CT head negative. No focal deficit at the time of my evaluation.  Exam no asterixis. Continue to trend sodium and correct with free water restriction. May require nephrology input if does not improve  Speech therapy recommends dysphagia diet.  Acute bronchitis. Patient does have some bilateral expiratory wheezing as well as some faint crackles. Also has some cough without any expectoration. Chest x-ray shows peribronchial thickening concerning for acute bronchitis. Currently on room air. Doxycycline, Mucinex, DuoNeb's added.  Hypoglycemia Resolved.  Suspect due to little PO intake and alcoholism.  Patient not diabetic.  Last Hbg A1c 5.0 on 06/30/19.  Continue CBG's  Hypophosphatemia In the setting refeeding syndrome in alcoholic patient that does not eat.  Treated with oral replacement. Dietician consulted  Increased anion gap (IAG) resolved Due to starvation ketoacidosis. Most likely in this patient starvation ketoacidosis, or possibly rhabdo as well. Not uremic.  Lactate normal.   CK and salicylates normal. Serum  beta-hydroxybutyrate elevated. monitor BMP  QTc Prolongation - QTc 544 on ECG of 12/21. keep K>4.0 and Mg>2.0 monitor on telemetry avoid QT prolonging agents  Essential hypertension continue amlodipine increase dose to 5mg  IV labetalol PRN  Episode of A-fib, resolved, converted to NSR. unclear if any hx of a-fib.   continue telemetry monitoring EKG if a-fib recurs  maintain K>4.0 and Mg>2.0  Moderate malnutrition  Body mass index is 23.11 kg/m.  Nutrition Problem: Moderate Malnutrition Etiology: social / environmental circumstances(EtOH abuse, inadequate oral intake) Interventions: Interventions: Ensure Enlive (each supplement provides 350kcal and 20 grams of protein), MVI  Diet: Cardiac diet  DVT Prophylaxis: Subcutaneous Lovenox   Advance goals of care discussion: Full code  Family Communication: I have updated [name] -she would like for him to be home rather than SNF Disposition: Likely tomorrow, home with home health  Consultants: none Procedures: none  Antibiotics: Anti-infectives (From admission, onward)   Start     Dose/Rate Route Frequency Ordered Stop   08/12/19 1000  doxycycline (VIBRA-TABS) tablet 100 mg     100 mg Oral Every 12 hours 08/12/19 0914         Objective: Physical Exam: Vitals:   08/13/19 1945 08/13/19 1959 08/14/19 0348 08/14/19 0736  BP:  138/69 (!) 126/57 107/79  Pulse:  77 75 97  Resp:  18 19   Temp:  98.4 F (36.9 C) 98.6 F (37 C) 97.8 F (36.6 C)  TempSrc:  Oral Oral Oral  SpO2: 97% 100% 99% 99%  Weight:      Height:        Intake/Output Summary (Last 24 hours) at 08/14/2019 1422 Last data filed at 08/14/2019 1202 Gross per 24 hour  Intake 456.76 ml  Output 200 ml  Net 256.76 ml   Filed Weights   08/09/19 1510  Weight: 71 kg   General: alert and oriented to time, place, and person. Appear in mild distress, affect flat in affect Eyes: PERRL, Conjunctiva normal ENT: Oral Mucosa Clear, moist  Neck: no JVD, no  Abnormal Mass Or lumps Cardiovascular: S1 and S2 Present, no Murmur,  Respiratory: increased respiratory effort, Bilateral Air entry equal and Decreased, no signs of accessory muscle use, bilateral  Crackles, Occasional  wheezes Abdomen: Bowel Sound present, Soft and no tenderness, no hernia Skin: no rashes  Extremities: no Pedal edema, no calf tenderness Neurologic: mental status, alert and oriented x3, speech normal, PERLA, Motor strength 5/5 and symmetric and Asterixis absent Gait not checked due to patient safety concerns  Data Reviewed: I have personally reviewed and interpreted daily labs, tele strips, imagings as discussed above. I reviewed all nursing notes, pharmacy notes, vitals, pertinent old records I have discussed plan of care as described above with RN and patient/family.  CBC: Recent Labs  Lab 08/09/19 1525 08/09/19 1805 08/10/19 0340 08/13/19 0414 08/14/19 0254  WBC 6.2 6.5 4.4 4.2 4.1  NEUTROABS 4.4  --   --   --   --   HGB 11.9* 12.0* 11.0* 9.7* 9.5*  HCT 36.4* 36.4* 32.3* 28.8* 27.0*  MCV 97.6 98.1 95.0 95.7 91.8  PLT 207 203 213 205 976   Basic Metabolic Panel: Recent Labs  Lab 08/09/19 1805 08/09/19 1806 08/10/19 0340 08/11/19 0856 08/12/19 0446 08/13/19 0414 08/13/19 1114 08/13/19 1427 08/13/19 1821 08/13/19  2217 08/14/19 0254  NA  --   --  128* 130* 127* 125* 127* 128* 127* 128* 128*  K  --   --  3.9 3.6 4.0 4.3 4.1 4.2 4.3 4.2 4.3  CL  --   --  94* 96* 93* 93* 94* 96* 97* 96* 96*  CO2  --   --  23 25 20* 22 23 22  21* 24 22  GLUCOSE  --   --  112* 110* 88 99 100* 101* 106* 99 95  BUN  --   --  6* <5* <5* 9 9 8 10 10 10   CREATININE  --   --  0.41* 0.58* 0.45* 0.64 0.54* 0.61 0.52* 0.57* 0.56*  CALCIUM  --   --  8.9 8.8* 9.1 9.3 9.4 9.2 9.0 9.0 8.9  MG   < > 1.8 2.2 1.8 2.2 1.9  --   --   --   --  1.7  PHOS  --  2.9 2.3* 1.6* 2.7 3.2  --   --   --   --   --    < > = values in this interval not displayed.    Liver Function Tests: Recent  Labs  Lab 08/09/19 1525 08/09/19 1805 08/10/19 0340  AST 24 22 19   ALT 8 9 8   ALKPHOS 65 65 60  BILITOT 1.4* 1.3* 1.3*  PROT 8.5* 7.9 7.6  ALBUMIN 3.7 3.5 3.3*   No results for input(s): LIPASE, AMYLASE in the last 168 hours. Recent Labs  Lab 08/11/19 1118  AMMONIA 30   Coagulation Profile: Recent Labs  Lab 08/10/19 0340 08/13/19 0414  INR 1.0 1.0   Cardiac Enzymes: Recent Labs  Lab 08/09/19 1805  CKTOTAL 103   BNP (last 3 results) No results for input(s): PROBNP in the last 8760 hours. CBG: Recent Labs  Lab 08/09/19 2235 08/10/19 0010 08/10/19 0206 08/10/19 0344 08/10/19 0613  GLUCAP 77 90 112* 114* 117*   Studies: No results found.   Time spent: 35 minutes  Author: 08/12/19, MD [facility] 08/14/2019 2:22 PM  To reach On-call, see care teams to locate the attending and reach out to them via [email or web address]/[email or web address]. If 7PM-7AM, please contact night-coverage If you still have difficulty reaching the attending provider, please page the [facility] (Director on Call) for [facility] on amion for assistance.

## 2019-08-14 NOTE — TOC Initial Note (Addendum)
Transition of Care [facility]) - Initial/Assessment Note    Patient Details  Name: Javier Robinson MRN: [id number] Date of Birth: March 02, 1946  [facility] CM/SW Contact:    Javier Siad, RN Phone Number: 08/14/2019, 2:38 PM  Clinical Narrative:                 RNCM received message from Dr. Sherryll Burger requesting home health services as family has elected to take patient home at discharge. I spoke with his partner Javier Robinson [phone] and she states he has a rolling walker and cane available for use. She states he has used [facility] in the past and prefers them again. Javier Robinson with [facility] is checking to make sure they can take him again. Update: [facility] cannot accept; referral to [facility] to see if they can accept. Update: [facility] is out of network. Checking with [facility]- they cannot accept. MD updated. Javier Robinson updated also. She again confirms no SNF. She would like outpatient therapy at [facility].MD updated.  Expected Discharge Plan: Home w Home Health Services Barriers to Discharge: No Barriers Identified   Patient Goals and CMS Choice Patient states their goals for this hospitalization and ongoing recovery are:: "bring him home" CMS Medicare.gov Compare Post Acute Care list provided to:: Patient Represenative (must comment)(Javier Robinson [phone]) Choice offered to / list presented to : Patient, Spouse  Expected Discharge Plan and Services Expected Discharge Plan: Home w Home Health Services   Discharge Planning Services: CM Consult Post Acute Care Choice: Home Health Living arrangements for the past 2 months: Single Family Home                           HH Arranged: PT, Social Work [employer] Agency: [facility] ([facility]) Date HH Agency Contacted: 08/14/19 Time HH Agency Contacted: 1438 Representative spoke with at [facility] Agency: Feliberto Gottron  Prior Living Arrangements/Services Living arrangements for the past 2 months: Single Family Home Lives with:: Significant  Other Patient language and need for interpreter reviewed:: Yes Do you feel safe going back to the place where you live?: Yes      Need for Family Participation in Patient Care: Yes (Comment) Care giver support system in place?: Yes (comment)(wife) Current home services: DME(rolling walker, cane) Criminal Activity/Legal Involvement Pertinent to Current Situation/Hospitalization: No - Comment as needed  Activities of Daily Living      Permission Sought/Granted Permission sought to share information with : Case Manager, Magazine features editor, Family Supports Permission granted to share information with : Yes, Verbal Permission Granted     Permission granted to share info w AGENCY: Neurosurgeon granted to share info w Relationship: Javier Robinson- wife     Emotional Assessment Appearance:: Appears stated age     Orientation: : Oriented to Self, Oriented to Place Alcohol / Substance Use: Alcohol Use Psych Involvement: No (comment)  Admission diagnosis:  Hypoglycemia [E16.2] Encephalopathy [G93.40] Acute encephalopathy [G93.40] Hyponatremia [E87.1] Patient Active Problem List   Diagnosis Date Noted  . Malnutrition of moderate degree 08/10/2019  . Acute encephalopathy 08/09/2019  . Alcohol dependence (HCC) 08/09/2019  . Essential hypertension 08/09/2019  . Metabolic acidosis, increased anion gap (IAG) 08/09/2019  . Prolonged QT interval 08/09/2019  . Hypertensive urgency 07/03/2019  . Acute CHF (congestive heart failure) (HCC) 06/30/2019  . Hypoglycemia 06/30/2019  . SIRS (systemic inflammatory response syndrome) (HCC) 06/30/2019  . Hyponatremia 06/30/2019  . Hypomagnesemia 06/30/2019   PCP:  Derwood Kaplan, MD Pharmacy:  [facility] [address] [place], [place] [address] Phone: [phone] Fax: [phone]     Social Determinants of Health (SDOH) Interventions    Readmission Risk  Interventions No flowsheet data found.

## 2019-08-15 LAB — CBC
HCT: 26 % — ABNORMAL LOW (ref 39.0–52.0)
Hemoglobin: 9.1 g/dL — ABNORMAL LOW (ref 13.0–17.0)
MCH: 32 pg (ref 26.0–34.0)
MCHC: 35 g/dL (ref 30.0–36.0)
MCV: 91.5 fL (ref 80.0–100.0)
Platelets: 243 10*3/uL (ref 150–400)
RBC: 2.84 MIL/uL — ABNORMAL LOW (ref 4.22–5.81)
RDW: 18.5 % — ABNORMAL HIGH (ref 11.5–15.5)
WBC: 4.3 10*3/uL (ref 4.0–10.5)
nRBC: 0 % (ref 0.0–0.2)

## 2019-08-15 LAB — BASIC METABOLIC PANEL
Anion gap: 9 (ref 5–15)
BUN: 9 mg/dL (ref 8–23)
CO2: 21 mmol/L — ABNORMAL LOW (ref 22–32)
Calcium: 8.9 mg/dL (ref 8.9–10.3)
Chloride: 96 mmol/L — ABNORMAL LOW (ref 98–111)
Creatinine, Ser: 0.48 mg/dL — ABNORMAL LOW (ref 0.61–1.24)
GFR calc Af Amer: >60 mL/min (ref >60)
GFR calc non Af Amer: >60 mL/min (ref >60)
Glucose, Bld: 92 mg/dL (ref 70–99)
Potassium: 4.2 mmol/L (ref 3.5–5.1)
Sodium: 126 mmol/L — ABNORMAL LOW (ref 135–145)

## 2019-08-15 LAB — SARS CORONAVIRUS 2 (TAT 6-24 HRS): SARS Coronavirus 2: NEGATIVE

## 2019-08-15 LAB — MAGNESIUM: Magnesium: 1.7 mg/dL (ref 1.7–2.4)

## 2019-08-15 MED ORDER — MAGNESIUM SULFATE 2 GM/50ML IV SOLN
2.0000 g | Freq: Once | INTRAVENOUS | Status: AC
  Administered 2019-08-15: 11:00:00 2 g via INTRAVENOUS
  Filled 2019-08-15: qty 50

## 2019-08-15 MED ORDER — FUROSEMIDE 20 MG PO TABS
20.0000 mg | ORAL_TABLET | Freq: Two times a day (BID) | ORAL | Status: DC
  Administered 2019-08-15 – 2019-08-16 (×2): 20 mg via ORAL
  Filled 2019-08-15 (×2): qty 1

## 2019-08-15 MED ORDER — SODIUM CHLORIDE 1 G PO TABS
1.0000 g | ORAL_TABLET | Freq: Two times a day (BID) | ORAL | Status: DC
  Administered 2019-08-15 – 2019-08-18 (×5): 1 g via ORAL
  Filled 2019-08-15 (×7): qty 1

## 2019-08-15 NOTE — Consult Note (Signed)
PHARMACY CONSULT NOTE - FOLLOW UP  Pharmacy Consult for Electrolyte Monitoring and Replacement   Recent Labs: Potassium (mmol/L)  Date Value  08/15/2019 4.2   Magnesium (mg/dL)  Date Value  08/15/2019 1.7   Calcium (mg/dL)  Date Value  08/15/2019 8.9   Albumin (g/dL)  Date Value  08/10/2019 3.3 (L)   Phosphorus (mg/dL)  Date Value  08/13/2019 3.2   Sodium (mmol/L)  Date Value  08/15/2019 126 (L)     Assessment: 73 y.o.malewith medical history significant ofalcohol dependence and hypertension.  Pharmacy has been consulted to monitor and replenish electrolytes.  Pt is at risk for refeeding syndrome per lack of prior oral nutrition.   Goal of Therapy:  To obtain goal of K+ above 4.0 and Mg+ above 2, Phos wnl's  Plan:  K 4.2  Mag 1.7  Scr 0.48  Na 126 -  Per eMAR pt did receive Mag yesterday 12/26 Will order Magnesium sulfate 2 gm IV x 1 again today  Will recheck electrolytes with am labs.  Noralee Space ,PharmD, BCPS Clinical Pharmacist 08/15/2019 9:50 AM
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
## 2019-08-15 NOTE — Progress Notes (Signed)
[facility] Progress Note  Patient: Javier Robinson:[id number]:[id number]   PCP: Derwood KaplanEason, Ernest B, MD DOB: 1945-10-23   DOA: 08/09/2019   DOS: 08/15/2019   Date of Service: the patient was seen and examined on 08/15/2019  Chief Complaint  Patient presents with  . Altered Mental Status  . Hypoglycemia   Brief hospital course: 73 y.o.malewith medical history significant ofalcohol dependence and hypertension brought to ED today via EMS after being found altered by his girlfriend at home.  EMS found patient hypoglycemic, treated en route, recurred in the ED, requiring D10 infusion.  Also with hyponatremia and acute encephalopathy on admission.  Hospital admission 06/30/19 to 07/03/19 for similar presentation.  In the ED, hypertensive 170/84, O2 sat 90% on room air, other vitals normal. Labs notable for Na 132, Cl 93, CO2 21, anion gap 18, total bili 1.4, hs-troponin 21, Hbg 11.9. Hypoglycemia which recurred after initial treatment in the ED and patient started on D5-1/2NS fluid. No leukocytosis. CT head was negative. Chest xray negative.  Admitted further evaluation and management of acute encephalopathy, hypoglycemia and hyponatremia.  Placed on CIWA protocol in case of alcohol withdrawal.  Currently further plan is continue treating pneumonia and hyponatremia.  Subjective: awake and alert, no acute complains.  He would like to go home  Assessment and Plan: Scheduled Meds: . dextromethorphan-guaiFENesin  1 tablet Oral BID  . doxycycline  100 mg Oral Q12H  . enoxaparin (LOVENOX) injection  40 mg Subcutaneous Q24H  . feeding supplement (ENSURE ENLIVE)  237 mL Oral TID BM  . folic acid  1 mg Oral Daily  . furosemide  20 mg Oral BID  . hydrALAZINE  25 mg Oral Q8H  . ipratropium-albuterol  3 mL Nebulization BID  . lactulose  20 g Oral BID  . LORazepam  0-4 mg Intravenous Q12H  . multivitamin with minerals  1 tablet Oral Daily  . pantoprazole  40 mg Oral Q0600  . prednisoLONE acetate  1  drop Left Eye TID  . sodium chloride flush  10-40 mL Intracatheter Q12H  . sodium chloride  1 g Oral BID WC  . vitamin B-12  1,000 mcg Oral Daily   Continuous Infusions:  PRN Meds: acetaminophen **OR** acetaminophen, bisacodyl, labetalol, LORazepam **OR** LORazepam, polyethylene glycol, sodium chloride flush   Hyponatremia Due to beer potomania.  Sodium 126 -> starting Lasix 20 mg p.o. twice daily and salt tablets.  Fluid restriction -Appreciate nephrology input  Acute metabolic encephalopathy -multifactorial most likely this is due to hyponatremia. Also possibility of hypoglycemia and alcohol abuse playing a role here. Patient was confused on 08/11/2019 likely from withdrawal. Possibility of B1 deficiency causing metabolic encephalopathy cannot be ruled out. CT head negative. dysphagia diet per speech therapy  Acute bronchitis. Patient does have some bilateral expiratory wheezing as well as some faint crackles. Also has some cough without any expectoration. Chest x-ray shows peribronchial thickening concerning for acute bronchitis. Currently on room air. Doxycycline, Mucinex, DuoNeb's added.  Hypoglycemia Resolved.  Suspect due to little PO intake and alcoholism.  Patient not diabetic.  Last Hbg A1c 5.0 on 06/30/19.  Continue CBG's  Hypophosphatemia In the setting refeeding syndrome in alcoholic patient that does not eat.  Treated with oral replacement. Dietician consulted   Increased anion gap (IAG) resolved Due to starvation ketoacidosis. Most likely in this patient starvation ketoacidosis, or possibly rhabdo as well. Not uremic.  Lactate normal.   CK and salicylates normal. Serum beta-hydroxybutyrate elevated. monitor BMP  QTc Prolongation - QTc  544 on ECG of 12/21. keep K>4.0 and Mg>2.0 monitor on telemetry avoid QT prolonging agents  Essential hypertension continue amlodipine  IV labetalol PRN  Episode of A-fib, resolved, converted to  NSR. unclear if any hx of a-fib.   continue telemetry monitoring EKG if a-fib recurs  maintain K>4.0 and Mg>2.0  Moderate malnutrition  Body mass index is 23.11 kg/m.  Nutrition Problem: Moderate Malnutrition Etiology: social / environmental circumstances(EtOH abuse, inadequate oral intake) Interventions: Interventions: Ensure Enlive (each supplement provides 350kcal and 20 grams of protein), MVI  Diet: Cardiac diet  DVT Prophylaxis: Subcutaneous Lovenox   Advance goals of care discussion: Full code  Family Communication: I have updated [name] -she would like for him to be home rather than SNF Disposition: RN is concerned about self neglect at home, she has requested social work evaluation.  Will await their input to see if we need guardian to make decision for him.  This certainly could delay disposition  Consultants: none Procedures: none  Antibiotics: Anti-infectives (From admission, onward)   Start     Dose/Rate Route Frequency Ordered Stop   08/12/19 1000  doxycycline (VIBRA-TABS) tablet 100 mg     100 mg Oral Every 12 hours 08/12/19 0914         Objective: Physical Exam: Vitals:   08/14/19 1917 08/14/19 1926 08/15/19 0402 08/15/19 0750  BP: 138/71  (!) 174/94 (!) 165/102  Pulse: 78  85 82  Resp: 18  (!) 22 16  Temp: 98.6 F (37 C)  98.4 F (36.9 C) 98.8 F (37.1 C)  TempSrc: Oral  Oral   SpO2: 100% 100% 100% 100%  Weight:      Height:        Intake/Output Summary (Last 24 hours) at 08/15/2019 1321 Last data filed at 08/15/2019 0646 Gross per 24 hour  Intake 1197.01 ml  Output 450 ml  Net 747.01 ml   Filed Weights   08/09/19 1510  Weight: 71 kg   General: alert and oriented to time, place, and person. Appear in mild distress, affect flat in affect Eyes: PERRL, Conjunctiva normal ENT: Oral Mucosa Clear, moist  Neck: no JVD, no Abnormal Mass Or lumps Cardiovascular: S1 and S2 Present, no Murmur,  Respiratory: increased respiratory effort, Bilateral  Air entry equal and Decreased, no signs of accessory muscle use, bilateral  Crackles, Occasional  wheezes Abdomen: Bowel Sound present, Soft and no tenderness, no hernia Skin: no rashes  Extremities: no Pedal edema, no calf tenderness Neurologic: mental status, alert and oriented x3, speech normal, PERLA, Motor strength 5/5 and symmetric and Asterixis absent Gait not checked due to patient safety concerns  Data Reviewed: I have personally reviewed and interpreted daily labs, tele strips, imagings as discussed above. I reviewed all nursing notes, pharmacy notes, vitals, pertinent old records I have discussed plan of care as described above with RN and patient/family.  CBC: Recent Labs  Lab 08/09/19 1525 08/09/19 1805 08/10/19 0340 08/13/19 0414 08/14/19 0254 08/15/19 0431  WBC 6.2 6.5 4.4 4.2 4.1 4.3  NEUTROABS 4.4  --   --   --   --   --   HGB 11.9* 12.0* 11.0* 9.7* 9.5* 9.1*  HCT 36.4* 36.4* 32.3* 28.8* 27.0* 26.0*  MCV 97.6 98.1 95.0 95.7 91.8 91.5  PLT 207 203 213 205 243 243   Basic Metabolic Panel: Recent Labs  Lab 08/09/19 1805 08/09/19 1806 08/10/19 0340 08/11/19 0856 08/12/19 0446 08/13/19 0414 08/13/19 1427 08/13/19 1821 08/13/19 2217 08/14/19 0254 08/15/19 0431  NA  --   --  128* 130* 127* 125* 128* 127* 128* 128* 126*  K  --   --  3.9 3.6 4.0 4.3 4.2 4.3 4.2 4.3 4.2  CL  --   --  94* 96* 93* 93* 96* 97* 96* 96* 96*  CO2  --   --  23 25 20* 22 22 21* 24 22 21*  GLUCOSE  --   --  112* 110* 88 99 101* 106* 99 95 92  BUN  --   --  6* <5* <5* 9 8 10 10 10 9   CREATININE  --   --  0.41* 0.58* 0.45* 0.64 0.61 0.52* 0.57* 0.56* 0.48*  CALCIUM  --   --  8.9 8.8* 9.1 9.3 9.2 9.0 9.0 8.9 8.9  MG   < > 1.8 2.2 1.8 2.2 1.9  --   --   --  1.7 1.7  PHOS  --  2.9 2.3* 1.6* 2.7 3.2  --   --   --   --   --    < > = values in this interval not displayed.    Liver Function Tests: Recent Labs  Lab 08/09/19 1525 08/09/19 1805 08/10/19 0340  AST 24 22 19   ALT 8 9 8    ALKPHOS 65 65 60  BILITOT 1.4* 1.3* 1.3*  PROT 8.5* 7.9 7.6  ALBUMIN 3.7 3.5 3.3*   No results for input(s): LIPASE, AMYLASE in the last 168 hours. Recent Labs  Lab 08/11/19 1118  AMMONIA 30   Coagulation Profile: Recent Labs  Lab 08/10/19 0340 08/13/19 0414  INR 1.0 1.0   Cardiac Enzymes: Recent Labs  Lab 08/09/19 1805  CKTOTAL 103   BNP (last 3 results) No results for input(s): PROBNP in the last 8760 hours. CBG: Recent Labs  Lab 08/09/19 2235 08/10/19 0010 08/10/19 0206 08/10/19 0344 08/10/19 0613  GLUCAP 77 90 112* 114* 117*   Studies: No results found.   Time spent: 35 minutes  Author: Max Sane, MD [facility] 08/15/2019 1:21 PM  To reach On-call, see care teams to locate the attending and reach out to them via [email or web address]. If 7PM-7AM, please contact night-coverage If you still have difficulty reaching the attending provider, please page the [facility] (Director on Call) for [facility] on amion for assistance.

## 2019-08-15 NOTE — Progress Notes (Signed)
Patient reported to this RN that he wants to go home. This RN asked patient if he was going to start drinking again if he went home. He stated "yes." Per the note written on admission by Dr. Arbutus Ped patient stated that his last full meal was one month ago (prior to admission). This RN is concerned that this patient is suffering from self-neglect. Social Work has been consulted. MD notified.

## 2019-08-16 LAB — BASIC METABOLIC PANEL
Anion gap: 8 (ref 5–15)
BUN: 7 mg/dL — ABNORMAL LOW (ref 8–23)
CO2: 22 mmol/L (ref 22–32)
Calcium: 8.8 mg/dL — ABNORMAL LOW (ref 8.9–10.3)
Chloride: 93 mmol/L — ABNORMAL LOW (ref 98–111)
Creatinine, Ser: 0.46 mg/dL — ABNORMAL LOW (ref 0.61–1.24)
GFR calc Af Amer: >60 mL/min (ref >60)
GFR calc non Af Amer: >60 mL/min (ref >60)
Glucose, Bld: 86 mg/dL (ref 70–99)
Potassium: 3.9 mmol/L (ref 3.5–5.1)
Sodium: 123 mmol/L — ABNORMAL LOW (ref 135–145)

## 2019-08-16 LAB — CBC
HCT: 25.6 % — ABNORMAL LOW (ref 39.0–52.0)
Hemoglobin: 9.2 g/dL — ABNORMAL LOW (ref 13.0–17.0)
MCH: 32.4 pg (ref 26.0–34.0)
MCHC: 35.9 g/dL (ref 30.0–36.0)
MCV: 90.1 fL (ref 80.0–100.0)
Platelets: 248 10*3/uL (ref 150–400)
RBC: 2.84 MIL/uL — ABNORMAL LOW (ref 4.22–5.81)
RDW: 18.4 % — ABNORMAL HIGH (ref 11.5–15.5)
WBC: 3.6 10*3/uL — ABNORMAL LOW (ref 4.0–10.5)
nRBC: 0 % (ref 0.0–0.2)

## 2019-08-16 LAB — PHOSPHORUS: Phosphorus: 4.8 mg/dL — ABNORMAL HIGH (ref 2.5–4.6)

## 2019-08-16 LAB — MAGNESIUM: Magnesium: 2.1 mg/dL (ref 1.7–2.4)

## 2019-08-16 NOTE — Progress Notes (Signed)
Physical Therapy Treatment Patient Details Name: Javier Robinson MRN: [id number] DOB: [birth year] Today's Date: 08/16/2019    History of Present Illness Per MD H&P: Pt is a 73 y.o. male with medical history significant of alcohol dependence and hypertension brought to ED today via EMS after being found altered by his girlfriend at home.  EMS found patient hypoglycemic, treated en route, recurred in the ED, requiring D10 infusion.  Also with hyponatremia and acute encephalopathy on admission.  Hospital admission 06/30/19 to 07/03/19 for similar presentation. Hypoglycemia which recurred after initial treatment in the ED.  CT head was negative.  Chest xray negative.  Admitted for further evaluation and management of acute encephalopathy, hypoglycemia and hyponatremia.  Placed on CIWA protocol in case of alcohol withdrawal. 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    PT Comments    Pt presented with deficits in strength, transfers, mobility, gait, balance, and activity tolerance but made good progress towards goals.  Pt presented with grossly improved level of alertness and functional strength with both his RUE and RLE this session.  Pt actively participated throughout the session and followed commands well although pt rarely communicated verbally.  Pt presented with grossly improved sitting and standing balance with decreased R lateral lean.  Pt was able to take several steps at the EOB and then to the recliner with only min A for stability and was able to maintain RUE grip on the RW without assistance. Pt will benefit from PT services in a SNF setting upon discharge to safely address above deficits for decreased caregiver assistance and eventual return to PLOF.     Follow Up Recommendations  SNF     Equipment Recommendations  None recommended by PT    Recommendations for Other Services       Precautions / Restrictions Precautions Precautions: Fall Restrictions Weight Bearing Restrictions:  No    Mobility  Bed Mobility Overal bed mobility: Needs Assistance Bed Mobility: Supine to Sit     Supine to sit: Min assist     General bed mobility comments: Min A to come to full upright sitting.  Transfers Overall transfer level: Needs assistance Equipment used: Rolling walker (2 wheeled) Transfers: Sit to/from Stand Sit to Stand: Min assist         General transfer comment: Mod verbal and tactile cues for increased trunk flexion and hand placement  Ambulation/Gait Ambulation/Gait assistance: Min assist Gait Distance (Feet): 3 Feet Assistive device: Rolling walker (2 wheeled) Gait Pattern/deviations: Step-to pattern;Decreased step length - right;Decreased step length - left;Narrow base of support Gait velocity: decreased   General Gait Details: Min A for stability with pt presenting with mild R lateral lean in standing and with amb but grossly improved from prior sessions   Stairs             Wheelchair Mobility    Modified Rankin (Stroke Patients Only)       Balance Overall balance assessment: Needs assistance Sitting-balance support: Single extremity supported;Feet supported Sitting balance-Leahy Scale: Fair Sitting balance - Comments: Able to sit unsupported this session with no lean or LOB   Standing balance support: Bilateral upper extremity supported;During functional activity Standing balance-Leahy Scale: Poor Standing balance comment: right lean and assist to remain upright                            Cognition Arousal/Alertness: Awake/alert Behavior During Therapy: Flat affect Overall Cognitive Status: No family/caregiver present to determine  baseline cognitive functioning                                        Exercises Total Joint Exercises Ankle Circles/Pumps: AROM;Both;10 reps Quad Sets: Strengthening;Both;10 reps Gluteal Sets: Strengthening;Both;10 reps Hip ABduction/ADduction: AAROM;Both;10  reps Straight Leg Raises: AAROM;Both;10 reps Long Arc Quad: Both;10 reps;15 reps;Strengthening Knee Flexion: Strengthening;Both;10 reps;15 reps Marching in Standing: AROM;Both;5 reps;Standing Other Exercises Other Exercises: Static standing with RW and min A for stability x 8 min with cues for L lateral weight shift    General Comments        Pertinent Vitals/Pain Pain Assessment: No/denies pain    Home Living                      Prior Function            PT Goals (current goals can now be found in the care plan section) Progress towards PT goals: Progressing toward goals    Frequency    Min 2X/week      PT Plan Current plan remains appropriate    Co-evaluation              AM-PAC PT "6 Clicks" Mobility   Outcome Measure  Help needed turning from your back to your side while in a flat bed without using bedrails?: A Little Help needed moving from lying on your back to sitting on the side of a flat bed without using bedrails?: A Little Help needed moving to and from a bed to a chair (including a wheelchair)?: A Little Help needed standing up from a chair using your arms (e.g., wheelchair or bedside chair)?: A Little Help needed to walk in hospital room?: A Lot Help needed climbing 3-5 steps with a railing? : Total 6 Click Score: 15    End of Session Equipment Utilized During Treatment: Gait belt Activity Tolerance: Patient tolerated treatment well Patient left: in chair;with call bell/phone within reach;with chair alarm set Nurse Communication: Mobility status PT Visit Diagnosis: Unsteadiness on feet (R26.81);Muscle weakness (generalized) (M62.81);Difficulty in walking, not elsewhere classified (R26.2)     Time: 4098-1191 PT Time Calculation (min) (ACUTE ONLY): 28 min  Charges:  $Therapeutic Exercise: 23-37 mins                     D. Scott Harl Wiechmann PT, DPT 08/16/19, 5:11 PM

## 2019-08-16 NOTE — Consult Note (Addendum)
PHARMACY CONSULT NOTE - FOLLOW UP  Pharmacy Consult for Electrolyte Monitoring and Replacement   Recent Labs: Potassium (mmol/L)  Date Value  08/16/2019 3.9   Magnesium (mg/dL)  Date Value  08/16/2019 2.1   Calcium (mg/dL)  Date Value  08/16/2019 8.8 (L)   Albumin (g/dL)  Date Value  08/10/2019 3.3 (L)   Phosphorus (mg/dL)  Date Value  08/16/2019 4.8 (H)   Sodium (mmol/L)  Date Value  08/16/2019 123 (L)     Assessment: 73 y.o.malewith medical history significant ofalcohol dependence and hypertension.  Pharmacy has been consulted to monitor and replenish electrolytes.  Pt is at risk for refeeding syndrome per lack of prior oral nutrition.   Goal of Therapy:  To obtain goal of K+ above 4.0 and Mg+ above 2, Phos wnl's  Plan:  K 3.9  Mg 2.1 Phos 4.8  Scr 0.46  Na 123  Per MAR, no recent phosphorus replacement. Will recheck with AM labs.    No additional replacement at this time.   Will recheck electrolytes with am labs.  Rowland Lathe ,PharmD Clinical Pharmacist 08/16/2019 2:13 PM

## 2019-08-16 NOTE — Care Management Important Message (Signed)
Important Message  Patient Details  Name: Javier Robinson MRN: [id number] Date of Birth: [birth year]   Medicare Important Message Given:  Yes     [name] A Gabreille Dardis 08/16/2019, 11:38 AM

## 2019-08-16 NOTE — Progress Notes (Signed)
[facility] [place] 08/16/19  Subjective:   Hospital day # 6 Doing fair Sodium is lower today States he ate some lunch. No nausea or vomiting  12/27 0701 - 12/28 0700 In: -  Out: 2500 [Urine:2500] Lab Results  Component Value Date   CREATININE 0.46 (L) 08/16/2019   CREATININE 0.48 (L) 08/15/2019   CREATININE 0.56 (L) 08/14/2019     Objective:  Vital signs in last 24 hours:  Temp:  [97.9 F (36.6 C)-98.5 F (36.9 C)] 98.3 F (36.8 C) (12/28 0814) Pulse Rate:  [75-90] 90 (12/28 0814) Resp:  [16-18] 16 (12/28 0814) BP: (130-149)/(72-77) 142/72 (12/28 0814) SpO2:  [97 %-100 %] 97 % (12/28 0814)  Weight change:  Filed Weights   08/09/19 1510  Weight: 71 kg    Intake/Output:    Intake/Output Summary (Last 24 hours) at 08/16/2019 1535 Last data filed at 08/16/2019 1300 Gross per 24 hour  Intake 600 ml  Output 2500 ml  Net -1900 ml     Physical Exam: General: Laying in bed, NAD  HEENT Moist oral mucus membranes  Pulm/lungs Coarse breath sounds  CVS/Heart Regular, soft systolic murmur  Abdomen:  Soft, non tender  Extremities: No edema  Neurologic: Alert, able to answer questions  Skin: No acute rashes          Basic Metabolic Panel:  Recent Labs  Lab 08/10/19 0340 08/11/19 0856 08/12/19 0446 08/13/19 0414 08/13/19 1821 08/13/19 2217 08/14/19 0254 08/15/19 0431 08/16/19 0600  NA 128* 130* 127* 125* 127* 128* 128* 126* 123*  K 3.9 3.6 4.0 4.3 4.3 4.2 4.3 4.2 3.9  CL 94* 96* 93* 93* 97* 96* 96* 96* 93*  CO2 23 25 20* 22 21* 24 22 21* 22  GLUCOSE 112* 110* 88 99 106* 99 95 92 86  BUN 6* <5* <5* 9 10 10 10 9  7*  CREATININE 0.41* 0.58* 0.45* 0.64 0.52* 0.57* 0.56* 0.48* 0.46*  CALCIUM 8.9 8.8* 9.1 9.3 9.0 9.0 8.9 8.9 8.8*  MG 2.2 1.8 2.2 1.9  --   --  1.7 1.7 2.1  PHOS 2.3* 1.6* 2.7 3.2  --   --   --   --  4.8*     CBC: Recent Labs  Lab 08/10/19 0340 08/13/19 0414 08/14/19 0254 08/15/19 0431 08/16/19 0600   WBC 4.4 4.2 4.1 4.3 3.6*  HGB 11.0* 9.7* 9.5* 9.1* 9.2*  HCT 32.3* 28.8* 27.0* 26.0* 25.6*  MCV 95.0 95.7 91.8 91.5 90.1  PLT 213 205 243 243 248     No results found for: HEPBSAG, HEPBSAB, HEPBIGM    Microbiology:  Recent Results (from the past 240 hour(s))  SARS CORONAVIRUS 2 (TAT 6-24 HRS) Nasopharyngeal Nasopharyngeal Swab     Status: None   Collection Time: 08/09/19  6:05 PM   Specimen: Nasopharyngeal Swab  Result Value Ref Range Status   SARS Coronavirus 2 NEGATIVE NEGATIVE Final    Comment: (NOTE) SARS-CoV-2 target nucleic acids are NOT DETECTED. The SARS-CoV-2 RNA is generally detectable in upper and lower respiratory specimens during the acute phase of infection. Negative results do not preclude SARS-CoV-2 infection, do not rule out co-infections with other pathogens, and should not be used as the sole basis for treatment or other patient management decisions. Negative results must be combined with clinical observations, patient history, and epidemiological information. The expected result is Negative. Fact Sheet for Patients: HairSlick.nohttps://www.fda.gov/media/138098/download Fact Sheet for Healthcare Providers: quierodirigir.comhttps://www.fda.gov/media/138095/download This test is not yet approved or cleared by the  [place] [employer] and  has been authorized for detection and/or diagnosis of SARS-CoV-2 by FDA under an TEFL teacher (EUA). This EUA will remain  in effect (meaning this test can be used) for the duration of the COVID-19 declaration under Section 56 4(b)(1) of the Act, 21 U.S.C. section 360bbb-3(b)(1), unless the authorization is terminated or revoked sooner. Performed at [facility], [address], [place], [place] [address]   SARS CORONAVIRUS 2 (TAT 6-24 HRS) Nasopharyngeal Nasopharyngeal Swab     Status: None   Collection Time: 08/14/19  1:17 PM   Specimen: Nasopharyngeal Swab  Result Value Ref Range Status   SARS Coronavirus 2 NEGATIVE NEGATIVE  Final    Comment: (NOTE) SARS-CoV-2 target nucleic acids are NOT DETECTED. The SARS-CoV-2 RNA is generally detectable in upper and lower respiratory specimens during the acute phase of infection. Negative results do not preclude SARS-CoV-2 infection, do not rule out co-infections with other pathogens, and should not be used as the sole basis for treatment or other patient management decisions. Negative results must be combined with clinical observations, patient history, and epidemiological information. The expected result is Negative. Fact Sheet for Patients: HairSlick.no Fact Sheet for Healthcare Providers: quierodirigir.com This test is not yet approved or cleared by the [place] FDA and  has been authorized for detection and/or diagnosis of SARS-CoV-2 by FDA under an Emergency Use Authorization (EUA). This EUA will remain  in effect (meaning this test can be used) for the duration of the COVID-19 declaration under Section 56 4(b)(1) of the Act, 21 U.S.C. section 360bbb-3(b)(1), unless the authorization is terminated or revoked sooner. Performed at [facility], [address], [place], [place] [address]     Coagulation Studies: No results for input(s): LABPROT, INR in the last 72 hours.  Urinalysis: No results for input(s): COLORURINE, LABSPEC, PHURINE, GLUCOSEU, HGBUR, BILIRUBINUR, KETONESUR, PROTEINUR, UROBILINOGEN, NITRITE, LEUKOCYTESUR in the last 72 hours.  Invalid input(s): APPERANCEUR    Imaging: No results found.   Medications:    . dextromethorphan-guaiFENesin  1 tablet Oral BID  . doxycycline  100 mg Oral Q12H  . enoxaparin (LOVENOX) injection  40 mg Subcutaneous Q24H  . feeding supplement (ENSURE ENLIVE)  237 mL Oral TID BM  . folic acid  1 mg Oral Daily  . furosemide  20 mg Oral BID  . hydrALAZINE  25 mg Oral Q8H  . ipratropium-albuterol  3 mL Nebulization BID  . lactulose  20 g Oral BID   . LORazepam  0-4 mg Intravenous Q12H  . multivitamin with minerals  1 tablet Oral Daily  . pantoprazole  40 mg Oral Q0600  . prednisoLONE acetate  1 drop Left Eye TID  . sodium chloride  1 g Oral BID WC  . vitamin B-12  1,000 mcg Oral Daily   acetaminophen **OR** acetaminophen, bisacodyl, labetalol, polyethylene glycol  Assessment/ Plan:  73 y.o. 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 Chronic hyponatremia with acute worsening. 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 States currently drinks a pint of liquor daily - not ready to quit  LOS: [place] 12/28/20203:35 PM  [place], [place]  Note: This note was prepared with Dragon dictation. Any transcription errors are unintentional

## 2019-08-16 NOTE — TOC Progression Note (Signed)
Transition of Care [facility]) - Progression Note    Patient Details  Name: Javier Robinson MRN: [id number] Date of Birth: 1945-12-22  Transition of [facility]) CM/SW Contact  Shelbie Hutching, RN Phone Number: 08/16/2019, 11:49 AM  Clinical Narrative:    Patient will need to stay in the hospital until is sodium level has normalized, it dropped over the past 2 days.  Patient and his significant other would like for the patient to return home at discharge.  Jana Half with [facility] accepted referral for home health services for PT, OT, and SW.  RNCM will cont to follow.    Expected Discharge Plan: [place] Barriers to Discharge: Continued Medical Work up(sodium has dropped)  Expected Discharge Plan and Services Expected Discharge Plan: [place]   Discharge Planning Services: CM Consult Post Acute Care Choice: [place] arrangements for the past 2 months: Single Family Home                           HH Arranged: PT, OT, Social Work [facility] Agency: [facility] Date [place]: 08/16/19 Time [place]: 0349 Representative spoke with at [place]: [place]) Interventions    Readmission Risk Interventions No flowsheet data found.

## 2019-08-16 NOTE — Progress Notes (Signed)
[facility] Progress Note  Patient: Javier Robinson E Silveira ZOX:[id number]   PCP: Derwood KaplanEason, Ernest B, MD DOB: Sep 02, 1945   DOA: 08/09/2019   DOS: 08/16/2019   Date of Service: the patient was seen and examined on 08/16/2019  Chief Complaint  Patient presents with  . Altered Mental Status  . Hypoglycemia   Brief hospital course: 73 y.o.malewith medical history significant ofalcohol dependence and hypertension brought to ED today via EMS after being found altered by his girlfriend at home.  EMS found patient hypoglycemic, treated en route, recurred in the ED, requiring D10 infusion.  Also with hyponatremia and acute encephalopathy on admission.  Hospital admission 06/30/19 to 07/03/19 for similar presentation.  In the ED, hypertensive 170/84, O2 sat 90% on room air, other vitals normal. Labs notable for Na 132, Cl 93, CO2 21, anion gap 18, total bili 1.4, hs-troponin 21, Hbg 11.9. Hypoglycemia which recurred after initial treatment in the ED and patient started on D5-1/2NS fluid. No leukocytosis. CT head was negative. Chest xray negative.  Admitted further evaluation and management of acute encephalopathy, hypoglycemia and hyponatremia.  Placed on CIWA protocol in case of alcohol withdrawal.  Currently further plan is continue treating pneumonia and hyponatremia.  Subjective: awake and alert, no acute complains.  He would like to go home  Assessment and Plan: Scheduled Meds: . dextromethorphan-guaiFENesin  1 tablet Oral BID  . doxycycline  100 mg Oral Q12H  . enoxaparin (LOVENOX) injection  40 mg Subcutaneous Q24H  . feeding supplement (ENSURE ENLIVE)  237 mL Oral TID BM  . folic acid  1 mg Oral Daily  . hydrALAZINE  25 mg Oral Q8H  . ipratropium-albuterol  3 mL Nebulization BID  . lactulose  20 g Oral BID  . LORazepam  0-4 mg Intravenous Q12H  . multivitamin with minerals  1 tablet Oral Daily  . pantoprazole  40 mg Oral Q0600  . prednisoLONE acetate  1 drop Left Eye TID  . sodium  chloride  1 g Oral BID WC  . vitamin B-12  1,000 mcg Oral Daily   Continuous Infusions:  PRN Meds: acetaminophen **OR** acetaminophen, bisacodyl, labetalol, polyethylene glycol   Hyponatremia Due to beer potomania.  Sodium 123  - fluid restriction to 1200 mL/day - hold Lasix per nephrology - continue Nacl tabs  - encouraged patient to limit alcohol intake.  His wife/significant other also at bedside and shares that it is a challenge for him to stop drinking  Acute metabolic encephalopathy -multifactorial most likely this is due to hyponatremia. Also possibility of hypoglycemia and alcohol abuse playing a role here. Patient was confused on 08/11/2019 likely from withdrawal. Possibility of B1 deficiency causing metabolic encephalopathy cannot be ruled out. CT head negative. dysphagia diet per speech therapy  Acute bronchitis. Patient does have some bilateral expiratory wheezing as well as some faint crackles. Also has some cough without any expectoration. Chest x-ray shows peribronchial thickening concerning for acute bronchitis. Currently on room air. Doxycycline, Mucinex, DuoNeb's added.  Hypoglycemia Resolved.  Suspect due to little PO intake and alcoholism.  Patient not diabetic.  Last Hbg A1c 5.0 on 06/30/19.  Continue CBG's  Hypophosphatemia In the setting refeeding syndrome in alcoholic patient that does not eat.  Treated with oral replacement. Dietician consulted   Increased anion gap (IAG) resolved Due to starvation ketoacidosis. Most likely in this patient starvation ketoacidosis, or possibly rhabdo as well. Not uremic.  Lactate normal.   CK and salicylates normal. Serum beta-hydroxybutyrate elevated. monitor BMP  QTc Prolongation -  QTc 544 on ECG of 12/21. keep K>4.0 and Mg>2.0 monitor on telemetry avoid QT prolonging agents  Essential hypertension continue amlodipine  IV labetalol PRN  Episode of A-fib, resolved, converted to  NSR. unclear if any hx of a-fib.   continue telemetry monitoring EKG if a-fib recurs  maintain K>4.0 and Mg>2.0  Moderate malnutrition  Body mass index is 23.11 kg/m.  Nutrition Problem: Moderate Malnutrition Etiology: social / environmental circumstances(EtOH abuse, inadequate oral intake) Interventions: Interventions: Ensure Enlive (each supplement provides 350kcal and 20 grams of protein), MVI  Diet: Cardiac diet  DVT Prophylaxis: Subcutaneous Lovenox   Advance goals of care discussion: Full code  Family Communication: I have updated [name] who was at bedside Disposition: Possible discharge home with home health in next 1 to 2 days depending on sodium improvement  Consultants: none Procedures: none  Antibiotics: Anti-infectives (From admission, onward)   Start     Dose/Rate Route Frequency Ordered Stop   08/12/19 1000  doxycycline (VIBRA-TABS) tablet 100 mg     100 mg Oral Every 12 hours 08/12/19 0914 08/16/19 2359       Objective: Physical Exam: Vitals:   08/15/19 1955 08/16/19 0535 08/16/19 0721 08/16/19 0814  BP: 130/74 (!) 149/77  (!) 142/72  Pulse: 80 75  90  Resp: 18 18  16   Temp: 98.5 F (36.9 C) 97.9 F (36.6 C)  98.3 F (36.8 C)  TempSrc: Oral Oral    SpO2: 100% 100% 100% 97%  Weight:      Height:        Intake/Output Summary (Last 24 hours) at 08/16/2019 1608 Last data filed at 08/16/2019 1300 Gross per 24 hour  Intake 600 ml  Output 2500 ml  Net -1900 ml   Filed Weights   08/09/19 1510  Weight: 71 kg   General: alert and oriented to time, place, and person. Appear in mild distress, affect flat in affect Eyes: PERRL, Conjunctiva normal ENT: Oral Mucosa Clear, moist  Neck: no JVD, no Abnormal Mass Or lumps Cardiovascular: S1 and S2 Present, no Murmur,  Respiratory: increased respiratory effort, Bilateral Air entry equal and Decreased, no signs of accessory muscle use, bilateral  Crackles, Occasional  wheezes Abdomen: Bowel Sound present,  Soft and no tenderness, no hernia Skin: no rashes  Extremities: no Pedal edema, no calf tenderness Neurologic: mental status, alert and oriented x3, speech normal, PERLA, Motor strength 5/5 and symmetric and Asterixis absent Gait not checked due to patient safety concerns  Data Reviewed: I have personally reviewed and interpreted daily labs, tele strips, imagings as discussed above. I reviewed all nursing notes, pharmacy notes, vitals, pertinent old records I have discussed plan of care as described above with RN and patient/family.  CBC: Recent Labs  Lab 08/10/19 0340 08/13/19 0414 08/14/19 0254 08/15/19 0431 08/16/19 0600  WBC 4.4 4.2 4.1 4.3 3.6*  HGB 11.0* 9.7* 9.5* 9.1* 9.2*  HCT 32.3* 28.8* 27.0* 26.0* 25.6*  MCV 95.0 95.7 91.8 91.5 90.1  PLT 213 205 243 243 248   Basic Metabolic Panel: Recent Labs  Lab 08/10/19 0340 08/11/19 0856 08/12/19 0446 08/13/19 0414 08/13/19 1821 08/13/19 2217 08/14/19 0254 08/15/19 0431 08/16/19 0600  NA 128* 130* 127* 125* 127* 128* 128* 126* 123*  K 3.9 3.6 4.0 4.3 4.3 4.2 4.3 4.2 3.9  CL 94* 96* 93* 93* 97* 96* 96* 96* 93*  CO2 23 25 20* 22 21* 24 22 21* 22  GLUCOSE 112* 110* 88 99 106* 99 95 92 86  BUN 6* <5* <5* 9 10 10 10 9  7*  CREATININE 0.41* 0.58* 0.45* 0.64 0.52* 0.57* 0.56* 0.48* 0.46*  CALCIUM 8.9 8.8* 9.1 9.3 9.0 9.0 8.9 8.9 8.8*  MG 2.2 1.8 2.2 1.9  --   --  1.7 1.7 2.1  PHOS 2.3* 1.6* 2.7 3.2  --   --   --   --  4.8*    Liver Function Tests: Recent Labs  Lab 08/09/19 1805 08/10/19 0340  AST 22 19  ALT 9 8  ALKPHOS 65 60  BILITOT 1.3* 1.3*  PROT 7.9 7.6  ALBUMIN 3.5 3.3*   No results for input(s): LIPASE, AMYLASE in the last 168 hours. Recent Labs  Lab 08/11/19 1118  AMMONIA 30   Coagulation Profile: Recent Labs  Lab 08/10/19 0340 08/13/19 0414  INR 1.0 1.0   Cardiac Enzymes: Recent Labs  Lab 08/09/19 1805  CKTOTAL 103   BNP (last 3 results) No results for input(s): PROBNP in the last 8760  hours. CBG: Recent Labs  Lab 08/09/19 2235 08/10/19 0010 08/10/19 0206 08/10/19 0344 08/10/19 0613  GLUCAP 77 90 112* 114* 117*   Studies: No results found.   Time spent: 35 minutes  Author: Max Sane, MD [facility] 08/16/2019 4:08 PM  To reach On-call, see care teams to locate the attending and reach out to them via [email or web address]. If 7PM-7AM, please contact night-coverage If you still have difficulty reaching the attending provider, please page the [facility] (Director on Call) for [facility] on amion for assistance.

## 2019-08-17 LAB — CBC
HCT: 23.8 % — ABNORMAL LOW (ref 39.0–52.0)
Hemoglobin: 8.5 g/dL — ABNORMAL LOW (ref 13.0–17.0)
MCH: 32.1 pg (ref 26.0–34.0)
MCHC: 35.7 g/dL (ref 30.0–36.0)
MCV: 89.8 fL (ref 80.0–100.0)
Platelets: 262 10*3/uL (ref 150–400)
RBC: 2.65 MIL/uL — ABNORMAL LOW (ref 4.22–5.81)
RDW: 18 % — ABNORMAL HIGH (ref 11.5–15.5)
WBC: 3.6 10*3/uL — ABNORMAL LOW (ref 4.0–10.5)
nRBC: 0 % (ref 0.0–0.2)

## 2019-08-17 LAB — BASIC METABOLIC PANEL
Anion gap: 8 (ref 5–15)
BUN: 9 mg/dL (ref 8–23)
CO2: 22 mmol/L (ref 22–32)
Calcium: 9 mg/dL (ref 8.9–10.3)
Chloride: 94 mmol/L — ABNORMAL LOW (ref 98–111)
Creatinine, Ser: 0.4 mg/dL — ABNORMAL LOW (ref 0.61–1.24)
GFR calc Af Amer: >60 mL/min (ref >60)
GFR calc non Af Amer: >60 mL/min (ref >60)
Glucose, Bld: 78 mg/dL (ref 70–99)
Potassium: 4.2 mmol/L (ref 3.5–5.1)
Sodium: 124 mmol/L — ABNORMAL LOW (ref 135–145)

## 2019-08-17 LAB — MAGNESIUM: Magnesium: 1.8 mg/dL (ref 1.7–2.4)

## 2019-08-17 LAB — SODIUM: Sodium: 122 mmol/L — ABNORMAL LOW (ref 135–145)

## 2019-08-17 LAB — PHOSPHORUS: Phosphorus: 5 mg/dL — ABNORMAL HIGH (ref 2.5–4.6)

## 2019-08-17 MED ORDER — MAGNESIUM SULFATE 2 GM/50ML IV SOLN
2.0000 g | Freq: Once | INTRAVENOUS | Status: AC
  Administered 2019-08-17: 2 g via INTRAVENOUS
  Filled 2019-08-17: qty 50

## 2019-08-17 NOTE — Progress Notes (Signed)
Physical Therapy Treatment Patient Details Name: Javier Robinson MRN: [id number] DOB: 1945/11/20 Today's Date: 08/17/2019    History of Present Illness Per MD H&P: Pt is a 73 y.o. male with medical history significant of alcohol dependence and hypertension brought to ED today via EMS after being found altered by his girlfriend at home.  EMS found patient hypoglycemic, treated en route, recurred in the ED, requiring D10 infusion.  Also with hyponatremia and acute encephalopathy on admission.  Hospital admission 06/30/19 to 07/03/19 for similar presentation. Hypoglycemia which recurred after initial treatment in the ED.  CT head was negative.  Chest xray negative.  Admitted for further evaluation and management of acute encephalopathy, hypoglycemia and hyponatremia.  Placed on CIWA protocol in case of alcohol withdrawal. 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    PT Comments    Pt presented with deficits in strength, transfers, mobility, gait, balance, and activity tolerance but made some progress towards goals this session.  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  Pt actively participated throughout the session and followed simple commands well.  Pt required min A with bed mobility and was steady sitting at the EOB.  Pt required min A to stand and once in standing was able to maintain standing balance without physical assistance with noted decrease in R lateral lean compared to previous sessions.  Pt ambulated a max of 5' at the EOB and then to the chair with a RW and CGA only.  Pt will benefit from PT services in a SNF setting upon discharge to safely address above deficits for decreased caregiver assistance and eventual return to PLOF.     Follow Up Recommendations  SNF     Equipment Recommendations  None recommended by PT    Recommendations for Other Services       Precautions / Restrictions Precautions Precautions:  Fall Restrictions Weight Bearing Restrictions: No    Mobility  Bed Mobility Overal bed mobility: Needs Assistance Bed Mobility: Supine to Sit;Rolling Rolling: Min assist   Supine to sit: Min assist     General bed mobility comments: Min A to come to full upright sitting.  Transfers Overall transfer level: Needs assistance Equipment used: Rolling walker (2 wheeled) Transfers: Sit to/from Stand Sit to Stand: Min assist;From elevated surface         General transfer comment: Min A for foot positioning prior to standing and min A to stand  Ambulation/Gait Ambulation/Gait assistance: Min guard Gait Distance (Feet): 5 Feet Assistive device: Rolling walker (2 wheeled) Gait Pattern/deviations: Step-to pattern;Decreased step length - right;Decreased step length - left Gait velocity: decreased   General Gait Details: Improved stability during gait this session with minimal R lateral lean and no physical assistance needed for stability   Stairs             Wheelchair Mobility    Modified Rankin (Stroke Patients Only)       Balance Overall balance assessment: Needs assistance Sitting-balance support: Single extremity supported;Feet supported Sitting balance-Leahy Scale: Fair Sitting balance - Comments: Able to sit unsupported this session with no lean or LOB   Standing balance support: Bilateral upper extremity supported;During functional activity Standing balance-Leahy Scale: Fair                              Cognition Arousal/Alertness: Awake/alert Behavior During Therapy: Flat affect Overall Cognitive Status: No family/caregiver present to determine  baseline cognitive functioning                                        Exercises Total Joint Exercises Ankle Circles/Pumps: AROM;Both;10 reps Quad Sets: Strengthening;Both;10 reps Gluteal Sets: Strengthening;Both;10 reps Heel Slides: AAROM;Both;10 reps Hip ABduction/ADduction:  AAROM;Both;10 reps Straight Leg Raises: AAROM;Both;10 reps Long Arc Quad: Both;10 reps;Strengthening Knee Flexion: Strengthening;Both;10 reps Marching in Standing: AROM;Both;5 reps;Standing Other Exercises Other Exercises: Multiple rolling left/right for bed mobility training and core therex    General Comments        Pertinent Vitals/Pain Pain Assessment: No/denies pain    Home Living                      Prior Function            PT Goals (current goals can now be found in the care plan section) Progress towards PT goals: Progressing toward goals    Frequency    Min 2X/week      PT Plan Current plan remains appropriate    Co-evaluation              AM-PAC PT "6 Clicks" Mobility   Outcome Measure  Help needed turning from your back to your side while in a flat bed without using bedrails?: A Little Help needed moving from lying on your back to sitting on the side of a flat bed without using bedrails?: A Little Help needed moving to and from a bed to a chair (including a wheelchair)?: A Little Help needed standing up from a chair using your arms (e.g., wheelchair or bedside chair)?: A Little Help needed to walk in hospital room?: A Lot Help needed climbing 3-5 steps with a railing? : Total 6 Click Score: 15    End of Session Equipment Utilized During Treatment: Gait belt Activity Tolerance: Patient tolerated treatment well Patient left: in chair;with call bell/phone within reach;with chair alarm set Nurse Communication: Mobility status;Other (comment)(Pt required new condom cath) PT Visit Diagnosis: Unsteadiness on feet (R26.81);Muscle weakness (generalized) (M62.81);Difficulty in walking, not elsewhere classified (R26.2)     Time: 1610-9604 PT Time Calculation (min) (ACUTE ONLY): 24 min  Charges:  $Therapeutic Exercise: 8-22 mins $Therapeutic Activity: 8-22 mins                     D. Scott Vidyuth Belsito PT, DPT 08/17/19, 4:44 PM

## 2019-08-17 NOTE — Progress Notes (Signed)
[facility] Progress Note  Patient: Javier Robinson KNL:[id number]   PCP: Marden Noble, MD DOB: Sep 23, 1945   DOA: 08/09/2019   DOS: 08/17/2019   Date of Service: the patient was seen and examined on 08/17/2019  Chief Complaint  Patient presents with  . Altered Mental Status  . Hypoglycemia   Brief hospital course: 73 y.o.malewith medical history significant ofalcohol dependence and hypertension brought to ED today via EMS after being found altered by his girlfriend at home.  EMS found patient hypoglycemic, treated en route, recurred in the ED, requiring D10 infusion.  Also with hyponatremia and acute encephalopathy on admission.  Hospital admission 06/30/19 to 07/03/19 for similar presentation.  In the ED, hypertensive 170/84, O2 sat 90% on room air, other vitals normal. Labs notable for Na 132, Cl 93, CO2 21, anion gap 18, total bili 1.4, hs-troponin 21, Hbg 11.9. Hypoglycemia which recurred after initial treatment in the ED and patient started on D5-1/2NS fluid. No leukocytosis. CT head was negative. Chest xray negative.  Admitted further evaluation and management of acute encephalopathy, hypoglycemia and hyponatremia.   Subjective: Sodium slowly improving. No new c/o  Assessment and Plan: Scheduled Meds: . dextromethorphan-guaiFENesin  1 tablet Oral BID  . enoxaparin (LOVENOX) injection  40 mg Subcutaneous Q24H  . feeding supplement (ENSURE ENLIVE)  237 mL Oral TID BM  . folic acid  1 mg Oral Daily  . hydrALAZINE  25 mg Oral Q8H  . ipratropium-albuterol  3 mL Nebulization BID  . lactulose  20 g Oral BID  . multivitamin with minerals  1 tablet Oral Daily  . pantoprazole  40 mg Oral Q0600  . prednisoLONE acetate  1 drop Left Eye TID  . sodium chloride  1 g Oral BID WC  . vitamin B-12  1,000 mcg Oral Daily   Continuous Infusions:  PRN Meds: acetaminophen **OR** acetaminophen, bisacodyl, labetalol, polyethylene glycol   Hyponatremia Due to beer potomania.  Sodium  123 -> 124 - continue fluid restriction to 1200 mL/day - continue salt tabs  - Nephro following  Acute metabolic encephalopathy - multifactorial most likely this is due to hyponatremia. Also possibility of hypoglycemia and alcohol abuse playing a role here. Patient was confused on 08/11/2019 likely from withdrawal. Possibility of B1 deficiency causing metabolic encephalopathy cannot be ruled out. CT head negative. dysphagia diet per speech therapy  Acute bronchitis. Patient does have some bilateral expiratory wheezing as well as some faint crackles. Also has some cough without any expectoration. Chest x-ray shows peribronchial thickening concerning for acute bronchitis. Currently on room air. Doxycycline, Mucinex, DuoNeb's added.  Hypoglycemia Resolved.  Suspect due to little PO intake and alcoholism.  Patient not diabetic.  Last Hbg A1c 5.0 on 06/30/19.  Continue CBG's  Hypophosphatemia In the setting refeeding syndrome in alcoholic patient that does not eat.  Treated with oral replacement. Dietitian following  Increased anion gap (IAG) resolved Due to starvation ketoacidosis. Most likely in this patient starvation ketoacidosis, or possibly rhabdo as well. Not uremic.  Lactate normal.   CK and salicylates normal. Serum beta-hydroxybutyrate elevated. monitor BMP  QTc Prolongation - QTc 544 on ECG of 12/21. keep K>4.0 and Mg>2.0 monitor on telemetry avoid QT prolonging agents  Essential hypertension continue amlodipine  IV labetalol PRN  Episode of A-fib, resolved, converted to NSR. maintain K>4.0 and Mg>2.0  Moderate malnutrition  Body mass index is 23.11 kg/m.  Nutrition Problem: Moderate Malnutrition Etiology: social / environmental circumstances(EtOH abuse, inadequate oral intake) Interventions: Interventions: Ensure Enlive (each  supplement provides 350kcal and 20 grams of protein), MVI  Diet: Cardiac diet  DVT Prophylaxis: Subcutaneous  Lovenox   Advance goals of care discussion: Full code  Family Communication: I have updated Rubin Payordith who was at bedside Disposition: Possible discharge home with home health in next 1 to 2 days depending on sodium improvement  Consultants: none Procedures: none  Antibiotics: Anti-infectives (From admission, onward)   Start     Dose/Rate Route Frequency Ordered Stop   08/12/19 1000  doxycycline (VIBRA-TABS) tablet 100 mg     100 mg Oral Every 12 hours 08/12/19 0914 08/16/19 2359       Objective: Physical Exam: Vitals:   08/17/19 0531 08/17/19 0754 08/17/19 0806 08/17/19 1320  BP: 111/65 119/64  (!) 114/56  Pulse: 78 80  78  Resp:  18  20  Temp: 98.6 F (37 C) 97.9 F (36.6 C)  98.5 F (36.9 C)  TempSrc: Oral Oral  Oral  SpO2: 97% 96% 97% 99%  Weight:      Height:        Intake/Output Summary (Last 24 hours) at 08/17/2019 1400 Last data filed at 08/17/2019 1300 Gross per 24 hour  Intake 650 ml  Output 1100 ml  Net -450 ml   Filed Weights   08/09/19 1510  Weight: 71 kg   General: alert and oriented to time, place, and person. Appear in mild distress, affect flat in affect Eyes: PERRL, Conjunctiva normal ENT: Oral Mucosa Clear, moist  Neck: no JVD, no Abnormal Mass Or lumps Cardiovascular: S1 and S2 Present, no Murmur,  Respiratory: increased respiratory effort, Bilateral Air entry equal and Decreased, no signs of accessory muscle use, bilateral  Crackles, Occasional  wheezes Abdomen: Bowel Sound present, Soft and no tenderness, no hernia Skin: no rashes  Extremities: no Pedal edema, no calf tenderness Neurologic: mental status, alert and oriented x3, speech normal, PERLA, Motor strength 5/5 and symmetric and Asterixis absent Gait not checked due to patient safety concerns  Data Reviewed: I have personally reviewed and interpreted daily labs, tele strips, imagings as discussed above. I reviewed all nursing notes, pharmacy notes, vitals, pertinent old records I  have discussed plan of care as described above with RN and patient/family.  CBC: Recent Labs  Lab 08/13/19 0414 08/14/19 0254 08/15/19 0431 08/16/19 0600 08/17/19 0416  WBC 4.2 4.1 4.3 3.6* 3.6*  HGB 9.7* 9.5* 9.1* 9.2* 8.5*  HCT 28.8* 27.0* 26.0* 25.6* 23.8*  MCV 95.7 91.8 91.5 90.1 89.8  PLT 205 243 243 248 262   Basic Metabolic Panel: Recent Labs  Lab 08/11/19 0856 08/12/19 0446 08/13/19 0414 08/13/19 2217 08/14/19 0254 08/15/19 0431 08/16/19 0600 08/17/19 0416 08/17/19 1243  NA 130* 127* 125* 128* 128* 126* 123* 124* 122*  K 3.6 4.0 4.3 4.2 4.3 4.2 3.9 4.2  --   CL 96* 93* 93* 96* 96* 96* 93* 94*  --   CO2 25 20* 22 24 22  21* 22 22  --   GLUCOSE 110* 88 99 99 95 92 86 78  --   BUN <5* <5* 9 10 10 9  7* 9  --   CREATININE 0.58* 0.45* 0.64 0.57* 0.56* 0.48* 0.46* 0.40*  --   CALCIUM 8.8* 9.1 9.3 9.0 8.9 8.9 8.8* 9.0  --   MG 1.8 2.2 1.9  --  1.7 1.7 2.1 1.8  --   PHOS 1.6* 2.7 3.2  --   --   --  4.8* 5.0*  --     Liver  Function Tests: No results for input(s): AST, ALT, ALKPHOS, BILITOT, PROT, ALBUMIN in the last 168 hours. No results for input(s): LIPASE, AMYLASE in the last 168 hours. Recent Labs  Lab 08/11/19 1118  AMMONIA 30   Coagulation Profile: Recent Labs  Lab 08/13/19 0414  INR 1.0   Studies: No results found.   Time spent: 35 minutes  Author: Delfino Lovett, MD [facility] 08/17/2019 2:00 PM  To reach On-call, see care teams to locate the attending and reach out to them via [email or web address]. If 7PM-7AM, please contact night-coverage If you still have difficulty reaching the attending provider, please page the [facility] (Director on Call) for [facility] on amion for assistance.

## 2019-08-17 NOTE — Progress Notes (Signed)
[place], [place] 08/17/19  Subjective:   Hospital day # 7 Doing fair Sodium is slightly better. Lasix was discontinued yesterday States he ate some eggs for breakfast.  No nausea or vomiting. States that he has not had a bowel movement.  12/28 0701 - 12/29 0700 In: 1080 [P.O.:1080] Out: 1100 [Urine:1100] Lab Results  Component Value Date   CREATININE 0.40 (L) 08/17/2019   CREATININE 0.46 (L) 08/16/2019   CREATININE 0.48 (L) 08/15/2019     Objective:  Vital signs in last 24 hours:  Temp:  [97.9 F (36.6 C)-98.6 F (37 C)] 97.9 F (36.6 C) (12/29 0754) Pulse Rate:  [73-94] 80 (12/29 0754) Resp:  [17-18] 18 (12/29 0754) BP: (111-150)/(64-82) 119/64 (12/29 0754) SpO2:  [96 %-100 %] 97 % (12/29 0806)  Weight change:  Filed Weights   08/09/19 1510  Weight: 71 kg    Intake/Output:    Intake/Output Summary (Last 24 hours) at 08/17/2019 1129 Last data filed at 08/17/2019 0600 Gross per 24 hour  Intake 720 ml  Output 1100 ml  Net -380 ml     Physical Exam: General: Laying in bed, NAD  HEENT Moist oral mucus membranes  Pulm/lungs Coarse breath sounds  CVS/Heart Regular, soft systolic murmur  Abdomen:  Soft, non tender  Extremities: No edema  Neurologic: Alert, able to answer questions  Skin: No acute rashes          Basic Metabolic Panel:  Recent Labs  Lab 08/11/19 0856 08/12/19 0446 08/13/19 0414 08/13/19 2217 08/14/19 0254 08/15/19 0431 08/16/19 0600 08/17/19 0416  NA 130* 127* 125* 128* 128* 126* 123* 124*  K 3.6 4.0 4.3 4.2 4.3 4.2 3.9 4.2  CL 96* 93* 93* 96* 96* 96* 93* 94*  CO2 25 20* 22 24 22  21* 22 22  GLUCOSE 110* 88 99 99 95 92 86 78  BUN <5* <5* 9 10 10 9  7* 9  CREATININE 0.58* 0.45* 0.64 0.57* 0.56* 0.48* 0.46* 0.40*  CALCIUM 8.8* 9.1 9.3 9.0 8.9 8.9 8.8* 9.0  MG 1.8 2.2 1.9  --  1.7 1.7 2.1 1.8  PHOS 1.6* 2.7 3.2  --   --   --  4.8* 5.0*     CBC: Recent Labs  Lab 08/13/19 0414 08/14/19 0254  08/15/19 0431 08/16/19 0600 08/17/19 0416  WBC 4.2 4.1 4.3 3.6* 3.6*  HGB 9.7* 9.5* 9.1* 9.2* 8.5*  HCT 28.8* 27.0* 26.0* 25.6* 23.8*  MCV 95.7 91.8 91.5 90.1 89.8  PLT 205 243 243 248 262     No results found for: HEPBSAG, HEPBSAB, HEPBIGM    Microbiology:  Recent Results (from the past 240 hour(s))  SARS CORONAVIRUS 2 (TAT 6-24 HRS) Nasopharyngeal Nasopharyngeal Swab     Status: None   Collection Time: 08/09/19  6:05 PM   Specimen: Nasopharyngeal Swab  Result Value Ref Range Status   SARS Coronavirus 2 NEGATIVE NEGATIVE Final    Comment: (NOTE) SARS-CoV-2 target nucleic acids are NOT DETECTED. The SARS-CoV-2 RNA is generally detectable in upper and lower respiratory specimens during the acute phase of infection. Negative results do not preclude SARS-CoV-2 infection, do not rule out co-infections with other pathogens, and should not be used as the sole basis for treatment or other patient management decisions. Negative results must be combined with clinical observations, patient history, and epidemiological information. The expected result is Negative. Fact Sheet for Patients: SugarRoll.be Fact Sheet for Healthcare Providers: https://www.woods-mathews.com/ This test is not yet approved or cleared by the  [place] [employer] trader and  has been authorized for detection and/or diagnosis of SARS-CoV-2 by FDA under an TEFL teacher (EUA). This EUA will remain  in effect (meaning this test can be used) for the duration of the COVID-19 declaration under Section 56 4(b)(1) of the Act, 21 U.S.C. section 360bbb-3(b)(1), unless the authorization is terminated or revoked sooner. Performed at [facility], [address] [place], [place] [address]   SARS CORONAVIRUS 2 (TAT 6-24 HRS) Nasopharyngeal Nasopharyngeal Swab     Status: None   Collection Time: 08/14/19  1:17 PM   Specimen: Nasopharyngeal Swab  Result Value Ref Range  Status   SARS Coronavirus 2 NEGATIVE NEGATIVE Final    Comment: (NOTE) SARS-CoV-2 target nucleic acids are NOT DETECTED. The SARS-CoV-2 RNA is generally detectable in upper and lower respiratory specimens during the acute phase of infection. Negative results do not preclude SARS-CoV-2 infection, do not rule out co-infections with other pathogens, and should not be used as the sole basis for treatment or other patient management decisions. Negative results must be combined with clinical observations, patient history, and epidemiological information. The expected result is Negative. Fact Sheet for Patients: HairSlick.no Fact Sheet for Healthcare Providers: quierodirigir.com This test is not yet approved or cleared by the [place] FDA and  has been authorized for detection and/or diagnosis of SARS-CoV-2 by FDA under an Emergency Use Authorization (EUA). This EUA will remain  in effect (meaning this test can be used) for the duration of the COVID-19 declaration under Section 56 4(b)(1) of the Act, 21 U.S.C. section 360bbb-3(b)(1), unless the authorization is terminated or revoked sooner. Performed at [facility], [address] [place], [place] [address]     Coagulation Studies: No results for input(s): LABPROT, INR in the last 72 hours.  Urinalysis: No results for input(s): COLORURINE, LABSPEC, PHURINE, GLUCOSEU, HGBUR, BILIRUBINUR, KETONESUR, PROTEINUR, UROBILINOGEN, NITRITE, LEUKOCYTESUR in the last 72 hours.  Invalid input(s): APPERANCEUR    Imaging: No results found.   Medications:    . dextromethorphan-guaiFENesin  1 tablet Oral BID  . enoxaparin (LOVENOX) injection  40 mg Subcutaneous Q24H  . feeding supplement (ENSURE ENLIVE)  237 mL Oral TID BM  . folic acid  1 mg Oral Daily  . hydrALAZINE  25 mg Oral Q8H  . ipratropium-albuterol  3 mL Nebulization BID  . lactulose  20 g Oral BID  . multivitamin  with minerals  1 tablet Oral Daily  . pantoprazole  40 mg Oral Q0600  . prednisoLONE acetate  1 drop Left Eye TID  . sodium chloride  1 g Oral BID WC  . vitamin B-12  1,000 mcg Oral Daily   acetaminophen **OR** acetaminophen, bisacodyl, labetalol, polyethylene glycol  Assessment/ Plan:  73 y.o. male  with EtOH abuse, seizure disorder, pulm HTN (echo 07/2019) and arthritis who was admitted to [facility] on 08/09/2019 for Hypoglycemia E16.2 Encephalopathy G93.40 Acute encephalopathy G93.40 Hyponatremia E87.1  1. Chronic hyponatremia with acute worsening. 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 Any transcription errors are unintentional

## 2019-08-17 NOTE — Progress Notes (Signed)
PT Cancellation Note  Patient Details Name: Javier Robinson MRN: [id number] DOB: [birth year]   Cancelled Treatment:    Reason Eval/Treat Not Completed: Fatigue/lethargy limiting ability to participate;Other (comment)(Patient sleeping upon PT attempt. Did not rouse to verbal, tactile, or sternal rub stimuli. Will attempt again at later time/date as available.)   Janna Arch, PT, DPT   08/17/2019, 3:03 PM

## 2019-08-17 NOTE — Progress Notes (Signed)
Nutrition Follow-up  RD working remotely.  DOCUMENTATION CODES:   Non-severe (moderate) malnutrition in context of social or environmental circumstances  INTERVENTION:  Continue Ensure Enlive po TID, each supplement provides 350 kcal and 20 grams of protein.  Continue folic acid 1 mg daily and MVI daly. Recommend resuming thiamine 100 mg daily.  NUTRITION DIAGNOSIS:   Moderate Malnutrition related to social / environmental circumstances(EtOH abuse, inadequate oral intake) as evidenced by moderate fat depletion, mild muscle depletion, moderate muscle depletion.  Ongoing - addressing with oral nutrition supplements.  GOAL:   Patient will meet greater than or equal to 90% of their needs  Progressing.  MONITOR:   PO intake, Supplement acceptance, Labs, Weight trends, I & O's  REASON FOR ASSESSMENT:   Consult Assessment of nutrition requirement/status  ASSESSMENT:   73 year old male with PMHx of arthritis, seizures, EtOH dependence admitted with hypoglycemia, increased anion gap, hyponatremia, acute encephalopathy.  Attempted to call patient over the phone but he was unable to answer. Patient was downgraded to dysphagia 2 diet on 12/24 and ordered for 1.2 L fluid restriction. PO intake has been variable from 30-100%. The average from meals documented in chart is 56%. He appears to be drinking 1-2 bottles per day of Ensure.  Medications reviewed and include: folic acid 1 mg daily, lactulose 20 grams BID, MVI daily, pantoprazole, vitamin B12 1000 micrograms daily.  Labs reviewed: Sodium 124, Chloride 94, Creatinine 0.4, Phosphorus 5.  Diet Order:   Diet Order            DIET DYS 2 Room service appropriate? Yes with Assist; Fluid consistency: Thin; Fluid restriction: 1200 mL Fluid  Diet effective now             EDUCATION NEEDS:   No education needs have been identified at this time  Skin:  Skin Assessment: Reviewed RN Assessment  Last BM:  08/16/2019 per  chart  Height:   Ht Readings from Last 1 Encounters:  08/09/19 5\' 9"  (1.753 m)   Weight:   Wt Readings from Last 1 Encounters:  08/09/19 71 kg   Ideal Body Weight:  72.7 kg  BMI:  Body mass index is 23.11 kg/m.  Estimated Nutritional Needs:   Kcal:  1800-2000  Protein:  90-100 grams  Fluid:  1.8-2 L/day  Jacklynn Barnacle, MS, RD, LDN Office: [phone] Pager: [phone] After Hours/Weekend Pager: [phone]

## 2019-08-17 NOTE — Consult Note (Signed)
PHARMACY CONSULT NOTE - FOLLOW UP  Pharmacy Consult for Electrolyte Monitoring and Replacement   Recent Labs: Potassium (mmol/L)  Date Value  08/17/2019 4.2   Magnesium (mg/dL)  Date Value  08/17/2019 1.8   Calcium (mg/dL)  Date Value  08/17/2019 9.0   Albumin (g/dL)  Date Value  08/10/2019 3.3 (L)   Phosphorus (mg/dL)  Date Value  08/17/2019 5.0 (H)   Sodium (mmol/L)  Date Value  08/17/2019 124 (L)     Assessment: 73 y.o.malewith medical history significant ofalcohol dependence and hypertension.  Pharmacy has been consulted to monitor and replenish electrolytes.  Pt is at risk for refeeding syndrome per lack of prior oral nutrition.   Goal of Therapy:  To obtain goal of K+ above 4.0 and Mg+ above 2, Phos wnl's  Plan:  K 4.2  Mg 1.8 Phos 5.0  Scr 0.4  Na 124  Phosphorus is continuing to trend up, no replacement is being given.   Will order Mag Sulfate 2g IV times one.  Will recheck electrolytes with am labs.  Paulina Fusi, PharmD, BCPS 08/17/2019 9:23 AM

## 2019-08-18 LAB — BASIC METABOLIC PANEL
Anion gap: 9 (ref 5–15)
BUN: 9 mg/dL (ref 8–23)
CO2: 22 mmol/L (ref 22–32)
Calcium: 8.8 mg/dL — ABNORMAL LOW (ref 8.9–10.3)
Chloride: 92 mmol/L — ABNORMAL LOW (ref 98–111)
Creatinine, Ser: 0.41 mg/dL — ABNORMAL LOW (ref 0.61–1.24)
GFR calc Af Amer: >60 mL/min (ref >60)
GFR calc non Af Amer: >60 mL/min (ref >60)
Glucose, Bld: 85 mg/dL (ref 70–99)
Potassium: 4.2 mmol/L (ref 3.5–5.1)
Sodium: 123 mmol/L — ABNORMAL LOW (ref 135–145)

## 2019-08-18 LAB — PHOSPHORUS: Phosphorus: 4.6 mg/dL (ref 2.5–4.6)

## 2019-08-18 LAB — MAGNESIUM: Magnesium: 1.9 mg/dL (ref 1.7–2.4)

## 2019-08-18 MED ORDER — MAGNESIUM SULFATE IN D5W 1-5 GM/100ML-% IV SOLN
1.0000 g | Freq: Once | INTRAVENOUS | Status: AC
  Administered 2019-08-18: 1 g via INTRAVENOUS
  Filled 2019-08-18: qty 100

## 2019-08-18 MED ORDER — CYANOCOBALAMIN 1000 MCG PO TABS: 1000.0000 ug | ORAL_TABLET | Freq: Every day | ORAL | 0 refills | Status: DC

## 2019-08-18 MED ORDER — SODIUM CHLORIDE 1 G PO TABS: 1.0000 g | ORAL_TABLET | Freq: Two times a day (BID) | ORAL | 0 refills | Status: DC

## 2019-08-18 NOTE — Discharge Summary (Signed)
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 Arthritis   . Pain    BACK. no specific injury   . Seizure ([place]) 2000   IN THE PAST. per patient, it was when he was drinking    HOSPITAL COURSE:  Javier Robinson is a 73 y.o. male with medical history significant of alcohol dependence and hypertension brought to ED via EMS after being found altered by his girlfriend at home  Hyponatremia acute on chronic Due to beer potomania.  Sodium 123 -> 124 - continue fluid restriction to 1200 mL/day - continue salt tabs  - Nephro following-- discussed with Dr. Candiss Norse. Patient's mentation is stable. This likely is his new baseline. Okay to go home from nephrology standpoint. Patient will continue salt tablets and follow-up with nephrology as outpatient. Javier Robinson informed  Acutemetabolicencephalopathy - multifactorial most likely this is due tohyponatremia. And hypoglycemia Patient was confused on 08/11/2019 likely from withdrawal-- now at baseline stable answered all questions appropriately CT head negative. dysphagia diet per speech therapy  Acute bronchitis-- resolved Patient does have some bilateral expiratory wheezing as well as some faint crackles. Also has some cough without any expectoration. Chest x-ray shows peribronchial thickening concerning for acute bronchitis. Currently on room  air. Finished doxycycline, Mucinex, DuoNeb's added.  Hypoglycemia Resolved.  Suspect due to poor PO intake and alcoholism.  Patient not diabetic.  Last Hbg A1c 5.0 on 06/30/19.   Hypophosphatemia In the setting refeeding syndrome in alcoholic patient that does not eat. Treated with oral replacement. Dietitian following  Increased anion gap (IAG) resolved Due to starvation ketoacidosis.  Lactate normal.  CK and salicylates normal. Serum beta-hydroxybutyrate elevated.  Essential hypertension continue amlodipine  IV labetalol PRN  Episode of A-fib, resolved, converted to NSR. maintain K>4.0 and Mg>2.0  Moderate malnutrition  Body mass index is 23.11 kg/m.  Nutrition Problem: Moderate Malnutrition Etiology: social / environmental circumstances(EtOH abuse, inadequate oral intake) Interventions: Interventions: Ensure Enlive (each supplement provides 350kcal and 20 grams of protein), MVI  Diet: Cardiac diet  DVT Prophylaxis: Subcutaneous Lovenox   Advance goals of care discussion: Full code  Family Communication: I have updated Javier Robinson on the phone Disposition:  discharge home with home health today with outpatient follow-up nephrology.   CONSULTS OBTAINED:  Treatment Team:  Murlean Iba, MD  DRUG ALLERGIES:  No Known Allergies  DISCHARGE MEDICATIONS:   Allergies as of 08/18/2019   No Known Allergies     Medication List    TAKE these medications   amLODipine 2.5 MG tablet Commonly known as: NORVASC Take 1 tablet (2.5 mg total) by mouth daily.   cyanocobalamin 1000 MCG tablet Take 1 tablet (1,000 mcg total) by mouth daily. Start taking on: August 19, 2019   feeding supplement (ENSURE ENLIVE) Liqd Take 237 mLs by mouth 3 (three) times daily between meals.   folic acid 1 MG tablet Commonly known as: FOLVITE Take 1 tablet (1 mg total) by mouth daily.   meloxicam 15 MG  tablet Commonly known as: MOBIC Take 15 mg by mouth daily after  breakfast.   multivitamins with iron Tabs tablet Take 1 tablet by mouth daily.   pantoprazole 40 MG tablet Commonly known as: PROTONIX Take 1 tablet (40 mg total) by mouth daily at 6 (six) AM.   prednisoLONE acetate 1 % ophthalmic suspension Commonly known as: PRED FORTE Place 1 drop into the left eye 3 (three) times daily.   sodium chloride 1 g tablet Take 1 tablet (1 g total) by mouth 2 (two) times daily with a meal.   thiamine 100 MG tablet Take 1 tablet (100 mg total) by mouth daily.            Durable Medical Equipment  (From admission, onward)         Start     Ordered   08/18/19 0944  DME Dan Humphreys  Once    Question Answer Comment  Walker: With 5 Inch Wheels   Patient needs a walker to treat with the following condition General weakness      08/18/19 0943          If you experience worsening of your admission symptoms, develop shortness of breath, life threatening emergency, suicidal or homicidal thoughts you must seek medical attention immediately by calling 911 or calling your MD immediately  if symptoms less severe.  You Must read complete instructions/literature along with all the possible adverse reactions/side effects for all the Medicines you take and that have been prescribed to you. Take any new Medicines after you have completely understood and accept all the possible adverse reactions/side effects.   Please note  You were cared for by a hospitalist during your hospital stay. If you have any questions about your discharge medications or the care you received while you were in the hospital after you are discharged, you can call the unit and asked to speak with the hospitalist on call if the hospitalist that took care of you is not available. Once you are discharged, your primary care physician will handle any further medical issues. Please note that NO REFILLS for any discharge medications will be authorized once you are discharged, as it is imperative that  you return to your primary care physician (or establish a relationship with a primary care physician if you do not have one) for your aftercare needs so that they can reassess your need for medications and monitor your lab values. Today   SUBJECTIVE   No new complaints. Ate good breakfast. Awake alert oriented times two  VITAL SIGNS:  Blood pressure 110/66, pulse 74, temperature 98.2 F (36.8 C), temperature source Oral, resp. rate 16, height 5\' 9"  (1.753 m), weight 71 kg, SpO2 100 %.  I/O:    Intake/Output Summary (Last 24 hours) at 08/18/2019 0946 Last data filed at 08/18/2019 0415 Gross per 24 hour  Intake 170 ml  Output 1000 ml  Net -830 ml    PHYSICAL EXAMINATION:  GENERAL:  73 y.o.-year-old patient lying in the bed with no acute distress. disheveled EYES: Pupils equal, round, reactive to light and accommodation. No scleral icterus. Extraocular muscles intact.  HEENT: Head atraumatic, normocephalic. Oropharynx and nasopharynx clear.  NECK:  Supple, no jugular venous distention. No thyroid enlargement, no tenderness.  LUNGS: Normal breath sounds bilaterally, no wheezing, rales,rhonchi or crepitation. No use of accessory muscles of respiration.  CARDIOVASCULAR: S1, S2 normal. No murmurs, rubs, or gallops.  ABDOMEN: Soft, non-tender, non-distended. Bowel sounds present. No organomegaly or mass.  EXTREMITIES: No  pedal edema, cyanosis, or clubbing.  NEUROLOGIC: Cranial nerves II through XII are intact. Muscle strength 5/5 in all extremities. Sensation intact. Gait not checked.  PSYCHIATRIC: The patient is alert and oriented x 2  SKIN: No obvious rash, lesion, or ulcer.   DATA REVIEW:   CBC  Recent Labs  Lab 08/17/19 0416  WBC 3.6*  HGB 8.5*  HCT 23.8*  PLT 262    Chemistries  Recent Labs  Lab 08/18/19 0517  NA 123*  K 4.2  CL 92*  CO2 22  GLUCOSE 85  BUN 9  CREATININE 0.41*  CALCIUM 8.8*  MG 1.9    Microbiology Results   Recent Results (from the past  240 hour(s))  SARS CORONAVIRUS 2 (TAT 6-24 HRS) Nasopharyngeal Nasopharyngeal Swab     Status: None   Collection Time: 08/09/19  6:05 PM   Specimen: Nasopharyngeal Swab  Result Value Ref Range Status   SARS Coronavirus 2 NEGATIVE NEGATIVE Final    Comment: (NOTE) SARS-CoV-2 target nucleic acids are NOT DETECTED. The SARS-CoV-2 RNA is generally detectable in upper and lower respiratory specimens during the acute phase of infection. Negative results do not preclude SARS-CoV-2 infection, do not rule out co-infections with other pathogens, and should not be used as the sole basis for treatment or other patient management decisions. Negative results must be combined with clinical observations, patient history, and epidemiological information. The expected result is Negative. Fact Sheet for Patients: HairSlick.nohttps://www.fda.gov/media/138098/download Fact Sheet for Healthcare Providers: quierodirigir.comhttps://www.fda.gov/media/138095/download This test is not yet approved or cleared by the [place] FDA and  has been authorized for detection and/or diagnosis of SARS-CoV-2 by FDA under an Emergency Use Authorization (EUA). This EUA will remain  in effect (meaning this test can be used) for the duration of the COVID-19 declaration under Section 56 4(b)(1) of the Act, 21 U.S.C. section 360bbb-3(b)(1), unless the authorization is terminated or revoked sooner. Performed at [facility], [address] [place], [place] [address]   SARS CORONAVIRUS 2 (TAT 6-24 HRS) Nasopharyngeal Nasopharyngeal Swab     Status: None   Collection Time: 08/14/19  1:17 PM   Specimen: Nasopharyngeal Swab  Result Value Ref Range Status   SARS Coronavirus 2 NEGATIVE NEGATIVE Final    Comment: (NOTE) SARS-CoV-2 target nucleic acids are NOT DETECTED. The SARS-CoV-2 RNA is generally detectable in upper and lower respiratory specimens during the acute phase of infection. Negative results do not preclude SARS-CoV-2 infection, do  not rule out co-infections with other pathogens, and should not be used as the sole basis for treatment or other patient management decisions. Negative results must be combined with clinical observations, patient history, and epidemiological information. The expected result is Negative. Fact Sheet for Patients: HairSlick.nohttps://www.fda.gov/media/138098/download Fact Sheet for Healthcare Providers: quierodirigir.comhttps://www.fda.gov/media/138095/download This test is not yet approved or cleared by the [place] FDA and  has been authorized for detection and/or diagnosis of SARS-CoV-2 by FDA under an Emergency Use Authorization (EUA). This EUA will remain  in effect (meaning this test can be used) for the duration of the COVID-19 declaration under Section 56 4(b)(1) of the Act, 21 U.S.C. section 360bbb-3(b)(1), unless the authorization is terminated or revoked sooner. Performed at [facility], [address] [place], [place] [address]     RADIOLOGY:  No results found.   CODE STATUS:     Code Status Orders  (From admission, onward)         Start     Ordered   08/09/19 1753  Full code  Continuous     08/09/19 1800        Code Status History    Date Active Date Inactive Code Status Order ID Comments User Context   06/30/2019 0427 07/03/2019 2010 Full Code [id number]  [facility] Vernetta Honey, MD ED   02/19/2014 0043 02/19/2014 1532 Full Code [id number]  Clement Sayres, MD ED   Advance Care Planning Activity       TOTAL TIME TAKING CARE OF THIS PATIENT: *40* minutes.    Enedina Finner M.D on 08/18/2019 at 9:46 AM  Between 7am to 6pm - Pager - [phone] After 6pm go to [email or web address] - password [email or web address]  [facility]    CC: Primary care physician; [facility]

## 2019-08-18 NOTE — TOC Transition Note (Signed)
Transition of Care [facility]) - CM/SW Discharge Note   Patient Details  Name: Javier Robinson MRN: [id number] Date of Birth: [birth year]  [facility]) CM/SW Contact:  Su Hilt, RN Phone Number: 08/18/2019, 10:51 AM   Clinical Narrative:    Patient to Discharge home with [facility] services, The patient has a RW and a cane at home and does not need additional DME. NO further needs from CM   Final next level of care: [place] Barriers to Discharge: Barriers Resolved   Patient Goals and CMS Choice Patient states their goals for this hospitalization and ongoing recovery are:: "bring him home" CMS Medicare.gov Compare Post Acute Care list provided to:: Patient Represenative (must comment) Choice offered to / list presented to : Spouse, Patient  Discharge Placement                       Discharge Plan and Services   Discharge Planning Services: CM Consult Post Acute Care Choice: Home Health                    HH Arranged: PT, OT, Social Work [facility] Agency: [facility] Date [place] Agency Contacted: 08/16/19 Time [place]: [id number] Representative spoke with at [place]: [place] ([place]) Interventions     Readmission Risk Interventions No flowsheet data found.

## 2019-08-18 NOTE — Discharge Instructions (Signed)
Patient advised to abstain from drinking alcohol/beer

## 2019-08-18 NOTE — Progress Notes (Signed)
Patient discharged home with fiance, education provided about discharge. No questions. Lupita Leash

## 2019-08-18 NOTE — Consult Note (Signed)
PHARMACY CONSULT NOTE - FOLLOW UP  Pharmacy Consult for Electrolyte Monitoring and Replacement   Recent Labs: Potassium (mmol/L)  Date Value  08/18/2019 4.2   Magnesium (mg/dL)  Date Value  08/18/2019 1.9   Calcium (mg/dL)  Date Value  08/18/2019 8.8 (L)   Albumin (g/dL)  Date Value  08/10/2019 3.3 (L)   Phosphorus (mg/dL)  Date Value  08/18/2019 4.6   Sodium (mmol/L)  Date Value  08/18/2019 123 (L)     Assessment: 74 y.o.malewith medical history significant ofalcohol dependence and hypertension.  Pharmacy has been consulted to monitor and replenish electrolytes.  Pt is at risk for refeeding syndrome per lack of prior oral nutrition.   Goal of Therapy:  To obtain goal of K+ above 4.0 and Mg+ above 2, Phos wnl's  Plan:  K 4.2  Mg 1.9 Phos 4.6  Scr 0.41  Na 123   Will order Mag Sulfate 1g IV times one.  Will recheck electrolytes with am labs.  Javier Robinson, PharmD, BCPS Clinical Pharmacist 08/18/2019 7:29 AM

## 2019-08-18 NOTE — Progress Notes (Signed)
[place], [place] 08/18/19  Subjective:   Hospital day # 8 Doing fair Sodium is about the same. Lasix was discontinued 2 days ago States he is able to eat.  No nausea or vomiting.  12/29 0701 - 12/30 0700 In: 170 [P.O.:120; IV Piggyback:50] Out: 1000 [Urine:1000] Lab Results  Component Value Date   CREATININE 0.41 (L) 08/18/2019   CREATININE 0.40 (L) 08/17/2019   CREATININE 0.46 (L) 08/16/2019     Objective:  Vital signs in last 24 hours:  Temp:  [98.2 F (36.8 C)-98.6 F (37 C)] 98.2 F (36.8 C) (12/30 0750) Pulse Rate:  [74-81] 74 (12/30 0750) Resp:  [16-20] 16 (12/30 0750) BP: (109-139)/(56-95) 110/66 (12/30 0750) SpO2:  [98 %-100 %] 100 % (12/30 0750)  Weight change:  Filed Weights   08/09/19 1510  Weight: 71 kg    Intake/Output:    Intake/Output Summary (Last 24 hours) at 08/18/2019 1110 Last data filed at 08/18/2019 1100 Gross per 24 hour  Intake 390 ml  Output 1000 ml  Net -610 ml     Physical Exam: General: Laying in bed, NAD  HEENT Moist oral mucus membranes  Pulm/lungs Coarse breath sounds  CVS/Heart Regular, soft systolic murmur  Abdomen:  Soft, non tender  Extremities: No edema  Neurologic: Alert, able to answer questions  Skin: No acute rashes          Basic Metabolic Panel:  Recent Labs  Lab 08/12/19 0446 08/13/19 0414 08/14/19 0254 08/15/19 0431 08/16/19 0600 08/17/19 0416 08/17/19 1243 08/18/19 0517  NA 127* 125* 128* 126* 123* 124* 122* 123*  K 4.0 4.3 4.3 4.2 3.9 4.2  --  4.2  CL 93* 93* 96* 96* 93* 94*  --  92*  CO2 20* 22 22 21* 22 22  --  22  GLUCOSE 88 99 95 92 86 78  --  85  BUN <5* 9 10 9  7* 9  --  9  CREATININE 0.45* 0.64 0.56* 0.48* 0.46* 0.40*  --  0.41*  CALCIUM 9.1 9.3 8.9 8.9 8.8* 9.0  --  8.8*  MG 2.2 1.9 1.7 1.7 2.1 1.8  --  1.9  PHOS 2.7 3.2  --   --  4.8* 5.0*  --  4.6     CBC: Recent Labs  Lab 08/13/19 0414 08/14/19 0254 08/15/19 0431 08/16/19 0600  08/17/19 0416  WBC 4.2 4.1 4.3 3.6* 3.6*  HGB 9.7* 9.5* 9.1* 9.2* 8.5*  HCT 28.8* 27.0* 26.0* 25.6* 23.8*  MCV 95.7 91.8 91.5 90.1 89.8  PLT 205 243 243 248 262     No results found for: HEPBSAG, HEPBSAB, HEPBIGM    Microbiology:  Recent Results (from the past 240 hour(s))  SARS CORONAVIRUS 2 (TAT 6-24 HRS) Nasopharyngeal Nasopharyngeal Swab     Status: None   Collection Time: 08/09/19  6:05 PM   Specimen: Nasopharyngeal Swab  Result Value Ref Range Status   SARS Coronavirus 2 NEGATIVE NEGATIVE Final    Comment: (NOTE) SARS-CoV-2 target nucleic acids are NOT DETECTED. The SARS-CoV-2 RNA is generally detectable in upper and lower respiratory specimens during the acute phase of infection. Negative results do not preclude SARS-CoV-2 infection, do not rule out co-infections with other pathogens, and should not be used as the sole basis for treatment or other patient management decisions. Negative results must be combined with clinical observations, patient history, and epidemiological information. The expected result is Negative. Fact Sheet for Patients: SugarRoll.be Fact Sheet for Healthcare Providers: https://www.woods-mathews.com/ This  test is not yet approved or cleared by the [place] and  has been authorized for detection and/or diagnosis of SARS-CoV-2 by FDA under an Emergency Use Authorization (EUA). This EUA will remain  in effect (meaning this test can be used) for the duration of the COVID-19 declaration under Section 56 4(b)(1) of the Act, 21 U.S.C. section 360bbb-3(b)(1), unless the authorization is terminated or revoked sooner. Performed at [facility], [address] [place], [place] [address]   SARS CORONAVIRUS 2 (TAT 6-24 HRS) Nasopharyngeal Nasopharyngeal Swab     Status: None   Collection Time: 08/14/19  1:17 PM   Specimen: Nasopharyngeal Swab  Result Value Ref Range Status   SARS Coronavirus 2  NEGATIVE NEGATIVE Final    Comment: (NOTE) SARS-CoV-2 target nucleic acids are NOT DETECTED. The SARS-CoV-2 RNA is generally detectable in upper and lower respiratory specimens during the acute phase of infection. Negative results do not preclude SARS-CoV-2 infection, do not rule out co-infections with other pathogens, and should not be used as the sole basis for treatment or other patient management decisions. Negative results must be combined with clinical observations, patient history, and epidemiological information. The expected result is Negative. Fact Sheet for Patients: HairSlick.no Fact Sheet for Healthcare Providers: quierodirigir.com This test is not yet approved or cleared by the [place] FDA and  has been authorized for detection and/or diagnosis of SARS-CoV-2 by FDA under an Emergency Use Authorization (EUA). This EUA will remain  in effect (meaning this test can be used) for the duration of the COVID-19 declaration under Section 56 4(b)(1) of the Act, 21 U.S.C. section 360bbb-3(b)(1), unless the authorization is terminated or revoked sooner. Performed at [facility], [address] [place], [place] [address]     Coagulation Studies: No results for input(s): LABPROT, INR in the last 72 hours.  Urinalysis: No results for input(s): COLORURINE, LABSPEC, PHURINE, GLUCOSEU, HGBUR, BILIRUBINUR, KETONESUR, PROTEINUR, UROBILINOGEN, NITRITE, LEUKOCYTESUR in the last 72 hours.  Invalid input(s): APPERANCEUR    Imaging: No results found.   Medications:    . dextromethorphan-guaiFENesin  1 tablet Oral BID  . enoxaparin (LOVENOX) injection  40 mg Subcutaneous Q24H  . feeding supplement (ENSURE ENLIVE)  237 mL Oral TID BM  . folic acid  1 mg Oral Daily  . hydrALAZINE  25 mg Oral Q8H  . ipratropium-albuterol  3 mL Nebulization BID  . lactulose  20 g Oral BID  . multivitamin with minerals  1 tablet Oral  Daily  . pantoprazole  40 mg Oral Q0600  . prednisoLONE acetate  1 drop Left Eye TID  . sodium chloride  1 g Oral BID WC  . vitamin B-12  1,000 mcg Oral Daily   acetaminophen **OR** acetaminophen, bisacodyl, labetalol, polyethylene glycol  Assessment/ Plan:  73 y.o. 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 Chronic hyponatremia with acute worsening. 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 Any transcription errors are unintentional

## 2019-11-11 ENCOUNTER — Emergency Department: Payer: Medicare Other

## 2019-11-11 ENCOUNTER — Other Ambulatory Visit: Payer: Self-pay

## 2019-11-11 ENCOUNTER — Inpatient Hospital Stay
Admission: EM | Admit: 2019-11-11 | Discharge: 2019-11-15 | DRG: 640 | Disposition: A | Discharge status: Skilled Nursing Facility | Payer: Medicare Other | Attending: Internal Medicine | Admitting: Internal Medicine

## 2019-11-11 DIAGNOSIS — E512 Wernicke's encephalopathy: Secondary | ICD-10-CM | POA: Diagnosis present

## 2019-11-11 DIAGNOSIS — E871 Hypo-osmolality and hyponatremia: Principal | ICD-10-CM | POA: Diagnosis present

## 2019-11-11 DIAGNOSIS — E872 Acidosis: Secondary | ICD-10-CM | POA: Diagnosis present

## 2019-11-11 DIAGNOSIS — E876 Hypokalemia: Secondary | ICD-10-CM | POA: Diagnosis present

## 2019-11-11 DIAGNOSIS — F17210 Nicotine dependence, cigarettes, uncomplicated: Secondary | ICD-10-CM | POA: Diagnosis present

## 2019-11-11 DIAGNOSIS — F1010 Alcohol abuse, uncomplicated: Secondary | ICD-10-CM | POA: Diagnosis present

## 2019-11-11 DIAGNOSIS — N390 Urinary tract infection, site not specified: Secondary | ICD-10-CM | POA: Diagnosis present

## 2019-11-11 DIAGNOSIS — Z20822 Contact with and (suspected) exposure to covid-19: Secondary | ICD-10-CM | POA: Diagnosis present

## 2019-11-11 DIAGNOSIS — B964 Proteus (mirabilis) (morganii) as the cause of diseases classified elsewhere: Secondary | ICD-10-CM | POA: Diagnosis present

## 2019-11-11 DIAGNOSIS — Z6820 Body mass index (BMI) 20.0-20.9, adult: Secondary | ICD-10-CM | POA: Diagnosis not present

## 2019-11-11 DIAGNOSIS — R4182 Altered mental status, unspecified: Secondary | ICD-10-CM | POA: Diagnosis not present

## 2019-11-11 DIAGNOSIS — E43 Unspecified severe protein-calorie malnutrition: Secondary | ICD-10-CM | POA: Insufficient documentation

## 2019-11-11 DIAGNOSIS — I10 Essential (primary) hypertension: Secondary | ICD-10-CM | POA: Diagnosis present

## 2019-11-11 DIAGNOSIS — K219 Gastro-esophageal reflux disease without esophagitis: Secondary | ICD-10-CM | POA: Diagnosis present

## 2019-11-11 DIAGNOSIS — M1990 Unspecified osteoarthritis, unspecified site: Secondary | ICD-10-CM | POA: Diagnosis present

## 2019-11-11 DIAGNOSIS — L89212 Pressure ulcer of right hip, stage 2: Secondary | ICD-10-CM | POA: Diagnosis present

## 2019-11-11 DIAGNOSIS — R1310 Dysphagia, unspecified: Secondary | ICD-10-CM | POA: Diagnosis present

## 2019-11-11 DIAGNOSIS — D649 Anemia, unspecified: Secondary | ICD-10-CM | POA: Diagnosis present

## 2019-11-11 DIAGNOSIS — G9341 Metabolic encephalopathy: Secondary | ICD-10-CM

## 2019-11-11 DIAGNOSIS — Z791 Long term (current) use of non-steroidal anti-inflammatories (NSAID): Secondary | ICD-10-CM | POA: Diagnosis not present

## 2019-11-11 DIAGNOSIS — F0390 Unspecified dementia without behavioral disturbance: Secondary | ICD-10-CM | POA: Diagnosis present

## 2019-11-11 DIAGNOSIS — R531 Weakness: Secondary | ICD-10-CM | POA: Diagnosis not present

## 2019-11-11 DIAGNOSIS — L8990 Pressure ulcer of unspecified site, unspecified stage: Secondary | ICD-10-CM | POA: Insufficient documentation

## 2019-11-11 DIAGNOSIS — G9340 Encephalopathy, unspecified: Secondary | ICD-10-CM

## 2019-11-11 DIAGNOSIS — R1319 Other dysphagia: Secondary | ICD-10-CM | POA: Diagnosis not present

## 2019-11-11 DIAGNOSIS — F10129 Alcohol abuse with intoxication, unspecified: Secondary | ICD-10-CM | POA: Diagnosis not present

## 2019-11-11 LAB — COMPREHENSIVE METABOLIC PANEL
ALT: 13 U/L (ref 0–44)
AST: 39 U/L (ref 15–41)
Albumin: 3.5 g/dL (ref 3.5–5.0)
Alkaline Phosphatase: 81 U/L (ref 38–126)
Anion gap: 13 (ref 5–15)
BUN: 8 mg/dL (ref 8–23)
CO2: 21 mmol/L — ABNORMAL LOW (ref 22–32)
Calcium: 9.2 mg/dL (ref 8.9–10.3)
Chloride: 86 mmol/L — ABNORMAL LOW (ref 98–111)
Creatinine, Ser: 0.52 mg/dL — ABNORMAL LOW (ref 0.61–1.24)
GFR calc Af Amer: >60 mL/min (ref >60)
GFR calc non Af Amer: >60 mL/min (ref >60)
Glucose, Bld: 58 mg/dL — ABNORMAL LOW (ref 70–99)
Potassium: 3.9 mmol/L (ref 3.5–5.1)
Sodium: 120 mmol/L — ABNORMAL LOW (ref 135–145)
Total Bilirubin: 1.3 mg/dL — ABNORMAL HIGH (ref 0.3–1.2)
Total Protein: 8.7 g/dL — ABNORMAL HIGH (ref 6.5–8.1)

## 2019-11-11 LAB — URINALYSIS, COMPLETE (UACMP) WITH MICROSCOPIC
Glucose, UA: NEGATIVE mg/dL
Ketones, ur: 80 mg/dL — AB
Nitrite: POSITIVE — AB
Protein, ur: 100 mg/dL — AB
Specific Gravity, Urine: 1.02 (ref 1.005–1.030)
pH: 8.5 — ABNORMAL HIGH (ref 5.0–8.0)

## 2019-11-11 LAB — CBC
HCT: 34.6 % — ABNORMAL LOW (ref 39.0–52.0)
Hemoglobin: 11.8 g/dL — ABNORMAL LOW (ref 13.0–17.0)
MCH: 31.6 pg (ref 26.0–34.0)
MCHC: 34.1 g/dL (ref 30.0–36.0)
MCV: 92.8 fL (ref 80.0–100.0)
Platelets: 233 10*3/uL (ref 150–400)
RBC: 3.73 MIL/uL — ABNORMAL LOW (ref 4.22–5.81)
RDW: 16.8 % — ABNORMAL HIGH (ref 11.5–15.5)
WBC: 5.1 10*3/uL (ref 4.0–10.5)
nRBC: 0 % (ref 0.0–0.2)

## 2019-11-11 LAB — GLUCOSE, CAPILLARY
Glucose-Capillary: 59 mg/dL — ABNORMAL LOW (ref 70–99)
Glucose-Capillary: 102 mg/dL — ABNORMAL HIGH (ref 70–99)

## 2019-11-11 LAB — ETHANOL: Alcohol, Ethyl (B): <10 mg/dL (ref <10)

## 2019-11-11 LAB — SARS CORONAVIRUS 2 (TAT 6-24 HRS): SARS Coronavirus 2: NEGATIVE

## 2019-11-11 LAB — BRAIN NATRIURETIC PEPTIDE: B Natriuretic Peptide: 59 pg/mL (ref 0.0–100.0)

## 2019-11-11 LAB — AMMONIA: Ammonia: 19 umol/L (ref 9–35)

## 2019-11-11 LAB — SODIUM: Sodium: 122 mmol/L — ABNORMAL LOW (ref 135–145)

## 2019-11-11 MED ORDER — ADULT MULTIVITAMIN W/MINERALS CH
1.0000 | ORAL_TABLET | Freq: Every day | ORAL | Status: DC
  Administered 2019-11-13 – 2019-11-15 (×3): 1 via ORAL
  Filled 2019-11-11 (×3): qty 1

## 2019-11-11 MED ORDER — DEXTROSE 50 % IV SOLN
1.0000 | Freq: Once | INTRAVENOUS | Status: AC
  Administered 2019-11-11: 50 mL via INTRAVENOUS
  Filled 2019-11-11: qty 50

## 2019-11-11 MED ORDER — SCOPOLAMINE 1 MG/3DAYS TD PT72
1.0000 | MEDICATED_PATCH | TRANSDERMAL | Status: DC | PRN
  Filled 2019-11-11: qty 1

## 2019-11-11 MED ORDER — THIAMINE HCL 100 MG PO TABS
100.0000 mg | ORAL_TABLET | Freq: Every day | ORAL | Status: DC
  Administered 2019-11-13 – 2019-11-15 (×3): 100 mg via ORAL
  Filled 2019-11-11 (×3): qty 1

## 2019-11-11 MED ORDER — ONDANSETRON HCL 4 MG PO TABS: 4.0000 mg | ORAL_TABLET | Freq: Four times a day (QID) | ORAL | Status: DC | PRN

## 2019-11-11 MED ORDER — SODIUM BICARBONATE 8.4 % IV SOLN
50.0000 meq | Freq: Once | INTRAVENOUS | Status: AC
  Administered 2019-11-11: 50 meq via INTRAVENOUS
  Filled 2019-11-11: qty 50

## 2019-11-11 MED ORDER — PANTOPRAZOLE SODIUM 40 MG PO TBEC
40.0000 mg | DELAYED_RELEASE_TABLET | Freq: Every day | ORAL | Status: DC
  Administered 2019-11-13 – 2019-11-15 (×3): 40 mg via ORAL
  Filled 2019-11-11 (×3): qty 1

## 2019-11-11 MED ORDER — ENOXAPARIN SODIUM 40 MG/0.4ML ~~LOC~~ SOLN
40.0000 mg | SUBCUTANEOUS | Status: DC
  Administered 2019-11-11 – 2019-11-14 (×4): 40 mg via SUBCUTANEOUS
  Filled 2019-11-11 (×4): qty 0.4

## 2019-11-11 MED ORDER — OXYCODONE HCL 5 MG PO TABS: 5.0000 mg | ORAL_TABLET | Freq: Four times a day (QID) | ORAL | Status: DC | PRN

## 2019-11-11 MED ORDER — ONDANSETRON 4 MG PO TBDP: 4.0000 mg | ORAL_TABLET | Freq: Four times a day (QID) | ORAL | Status: DC | PRN

## 2019-11-11 MED ORDER — SODIUM CHLORIDE 0.9 % IV SOLN
2.0000 g | Freq: Once | INTRAVENOUS | Status: AC
  Administered 2019-11-11: 2 g via INTRAVENOUS
  Filled 2019-11-11: qty 20

## 2019-11-11 MED ORDER — SODIUM CHLORIDE 0.9% FLUSH
3.0000 mL | Freq: Once | INTRAVENOUS | Status: AC
  Administered 2019-11-11: 3 mL via INTRAVENOUS

## 2019-11-11 MED ORDER — THIAMINE HCL 100 MG/ML IJ SOLN
100.0000 mg | Freq: Once | INTRAMUSCULAR | Status: AC
  Administered 2019-11-11: 100 mg via INTRAMUSCULAR
  Filled 2019-11-11: qty 2

## 2019-11-11 MED ORDER — BISACODYL 5 MG PO TBEC: 5.0000 mg | DELAYED_RELEASE_TABLET | Freq: Every day | ORAL | Status: DC | PRN

## 2019-11-11 MED ORDER — LORAZEPAM 1 MG PO TABS: 1.0000 mg | ORAL_TABLET | Freq: Four times a day (QID) | ORAL | Status: AC | PRN

## 2019-11-11 MED ORDER — SODIUM CHLORIDE 0.9 % IV SOLN
1.0000 g | INTRAVENOUS | Status: DC
  Administered 2019-11-12 – 2019-11-15 (×4): 1 g via INTRAVENOUS
  Filled 2019-11-11 (×4): qty 1

## 2019-11-11 MED ORDER — ENOXAPARIN SODIUM 40 MG/0.4ML ~~LOC~~ SOLN: 40.0000 mg | SUBCUTANEOUS | Status: DC

## 2019-11-11 MED ORDER — SODIUM CHLORIDE 0.9 % IV SOLN: Freq: Once | INTRAVENOUS | Status: DC

## 2019-11-11 MED ORDER — SODIUM CHLORIDE 0.9% FLUSH
3.0000 mL | Freq: Two times a day (BID) | INTRAVENOUS | Status: DC
  Administered 2019-11-12 – 2019-11-13 (×2): 3 mL via INTRAVENOUS
  Administered 2019-11-14: 10 mL via INTRAVENOUS
  Administered 2019-11-14 – 2019-11-15 (×2): 3 mL via INTRAVENOUS

## 2019-11-11 MED ORDER — ONDANSETRON HCL 4 MG/2ML IJ SOLN: 4.0000 mg | Freq: Four times a day (QID) | INTRAMUSCULAR | Status: DC | PRN

## 2019-11-11 MED ORDER — SODIUM CHLORIDE 0.9 % IV SOLN: INTRAVENOUS | Status: DC
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
## 2019-11-11 NOTE — ED Notes (Signed)
Pt in urine soaked clothes and brief. Brief and clothing and bedding changed. Pt washed. Pt with small dime sized skin tear to right upper sacrum.
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
## 2019-11-11 NOTE — ED Triage Notes (Addendum)
EMS called out the pt house with c/o pt being altered and weak this morning, normally uses a walker, pt has a hx of CVA but the care giver is unsure if the right face droop is new or not. Pt is incontinent of urine on arrival, EMS reports 3 empty beer cans on the table and the pt admits to drinking them the night before. EMS reports concerns for pt returning to the residence due to poor living conditions. B/p 144/76, HR 95, 97%, CBG 68

## 2019-11-12 DIAGNOSIS — E876 Hypokalemia: Secondary | ICD-10-CM

## 2019-11-12 DIAGNOSIS — F10129 Alcohol abuse with intoxication, unspecified: Secondary | ICD-10-CM

## 2019-11-12 LAB — CBC
HCT: 29.7 % — ABNORMAL LOW (ref 39.0–52.0)
Hemoglobin: 9.9 g/dL — ABNORMAL LOW (ref 13.0–17.0)
MCH: 30.8 pg (ref 26.0–34.0)
MCHC: 33.3 g/dL (ref 30.0–36.0)
MCV: 92.5 fL (ref 80.0–100.0)
Platelets: 237 10*3/uL (ref 150–400)
RBC: 3.21 MIL/uL — ABNORMAL LOW (ref 4.22–5.81)
RDW: 16.8 % — ABNORMAL HIGH (ref 11.5–15.5)
WBC: 4.9 10*3/uL (ref 4.0–10.5)
nRBC: 0 % (ref 0.0–0.2)

## 2019-11-12 LAB — COMPREHENSIVE METABOLIC PANEL
ALT: 10 U/L (ref 0–44)
AST: 29 U/L (ref 15–41)
Albumin: 2.9 g/dL — ABNORMAL LOW (ref 3.5–5.0)
Alkaline Phosphatase: 68 U/L (ref 38–126)
Anion gap: 12 (ref 5–15)
BUN: 6 mg/dL — ABNORMAL LOW (ref 8–23)
CO2: 21 mmol/L — ABNORMAL LOW (ref 22–32)
Calcium: 8.7 mg/dL — ABNORMAL LOW (ref 8.9–10.3)
Chloride: 90 mmol/L — ABNORMAL LOW (ref 98–111)
Creatinine, Ser: 0.54 mg/dL — ABNORMAL LOW (ref 0.61–1.24)
GFR calc Af Amer: >60 mL/min (ref >60)
GFR calc non Af Amer: >60 mL/min (ref >60)
Glucose, Bld: 70 mg/dL (ref 70–99)
Potassium: 3.4 mmol/L — ABNORMAL LOW (ref 3.5–5.1)
Sodium: 123 mmol/L — ABNORMAL LOW (ref 135–145)
Total Bilirubin: 1.3 mg/dL — ABNORMAL HIGH (ref 0.3–1.2)
Total Protein: 7 g/dL (ref 6.5–8.1)

## 2019-11-12 LAB — SODIUM: Sodium: 124 mmol/L — ABNORMAL LOW (ref 135–145)

## 2019-11-12 MED ORDER — ORAL CARE MOUTH RINSE
15.0000 mL | Freq: Two times a day (BID) | OROMUCOSAL | Status: DC
  Administered 2019-11-12 – 2019-11-15 (×6): 15 mL via OROMUCOSAL

## 2019-11-12 MED ORDER — SODIUM BICARBONATE 650 MG PO TABS
650.0000 mg | ORAL_TABLET | Freq: Two times a day (BID) | ORAL | Status: DC
  Administered 2019-11-12 – 2019-11-13 (×3): 650 mg via ORAL
  Filled 2019-11-12 (×3): qty 1

## 2019-11-12 MED ORDER — POTASSIUM CHLORIDE 10 MEQ/100ML IV SOLN
10.0000 meq | INTRAVENOUS | Status: AC
  Administered 2019-11-12 (×2): 10 meq via INTRAVENOUS
  Filled 2019-11-12 (×2): qty 100

## 2019-11-12 MED ORDER — CHLORHEXIDINE GLUCONATE 0.12 % MT SOLN
15.0000 mL | Freq: Two times a day (BID) | OROMUCOSAL | Status: DC
  Administered 2019-11-12 – 2019-11-15 (×6): 15 mL via OROMUCOSAL
  Filled 2019-11-12 (×6): qty 15

## 2019-11-12 MED ORDER — NEPRO/CARBSTEADY PO LIQD
237.0000 mL | Freq: Three times a day (TID) | ORAL | Status: DC
  Administered 2019-11-12 – 2019-11-15 (×8): 237 mL via ORAL

## 2019-11-12 NOTE — Progress Notes (Signed)
PROGRESS NOTE    Javier Robinson  QAS:[id number] DOB: [birth year] DOA: 11/11/2019 [facility]CP: [facility]      Assessment & Plan:   Active Problems:   Hyponatremia  Hyponatremia: likely chronic. Continue on IVFs. Na level Q6H. Will continue to monitor closely  Alcohol abuse: will start CIWA protocol. Alcohol cessation counseling  Acute encephalopathy: ddx Wernicke's vs UTI vs hepatic vs dementia. Possible hx of dementia as per pt's caregiver.  CT brain showed no acute intracranial abnormalities. Ammonia level is WNL. Will continue on thiamine.   Metabolic acidosis: will continue on bicarb   Normocytic anemia: no need for a transfusion at this time. Will continue to monitor   Possible UTI: UA is positive, urine cx ordered. Continue on IV rocephin   Hypokalemia: KCl repleated. Will continue to monitor  Dysphagia: started on nectar thick dysphagia diet as per speech  Generalized weakness: PT/OT consulted  Prolonged QT interval: etiology unclear. Will continue on tele   GERD: continue on PPI     DVT prophylaxis: lovenox Code Status: full Family Communication:  Disposition Plan: depends on PT/OT recs   Consultants:      Procedures:    Antimicrobials: rocephin   Subjective: Pt c/o malaise   Objective: Vitals:   11/11/19 2000 11/11/19 2141 11/12/19 0234 11/12/19 0421  BP: (!) 141/82 (!) 164/70 128/72 114/69  Pulse:  (!) 105 90 93  Resp: (!) 24 18 18 20   Temp: 99 F (37.2 C) 97.7 F (36.5 C) 98.5 F (36.9 C) 98.4 F (36.9 C)  TempSrc: Oral Oral Oral Oral  SpO2:  100% 98% 98%  Weight:  65.3 kg    Height:  5\' 11"  (1.803 m)      Intake/Output Summary (Last 24 hours) at 11/12/2019 0728 Last data filed at 11/12/2019 0529 Gross per 24 hour  Intake 1038.91 ml  Output 200 ml  Net 838.91 ml   Filed Weights   11/11/19 1624 11/11/19 1635 11/11/19 2141  Weight: 62.8 kg 62.8 kg 65.3 kg    Examination:  General exam: Appears  calm and comfortable  Respiratory system: Clear to auscultation. No wheezes, rales Cardiovascular system: S1 & S2 +. No rubs, gallops or clicks.  Gastrointestinal system: Abdomen is nondistended, soft and nontender. Normal bowel sounds heard. Central nervous system: Alert and oriented. Moves all 4 extremities  Psychiatry: Judgement and insight appear abnormal. Flat mood and affect     Data Reviewed: I have personally reviewed following labs and imaging studies  CBC: Recent Labs  Lab 11/11/19 1059 11/12/19 0545  WBC 5.1 4.9  HGB 11.8* 9.9*  HCT 34.6* 29.7*  MCV 92.8 92.5  PLT 233 237   Basic Metabolic Panel: Recent Labs  Lab 11/11/19 1059 11/11/19 2146 11/12/19 0545  NA 120* 122* 123*  K 3.9  --  3.4*  CL 86*  --  90*  CO2 21*  --  21*  GLUCOSE 58*  --  70  BUN 8  --  6*  CREATININE 0.52*  --  0.54*  CALCIUM 9.2  --  8.7*   GFR: Estimated Creatinine Clearance: 72.6 mL/min (A) (by C-G formula based on SCr of 0.54 mg/dL (L)). Liver Function Tests: Recent Labs  Lab 11/11/19 1059 11/12/19 0545  AST 39 29  ALT 13 10  ALKPHOS 81 68  BILITOT 1.3* 1.3*  PROT 8.7* 7.0  ALBUMIN 3.5 2.9*   No results for input(s): LIPASE, AMYLASE in the last 168 hours. Recent Labs  Lab 11/11/19  1251  AMMONIA 19   Coagulation Profile: No results for input(s): INR, PROTIME in the last 168 hours. Cardiac Enzymes: No results for input(s): CKTOTAL, CKMB, CKMBINDEX, TROPONINI in the last 168 hours. BNP (last 3 results) No results for input(s): PROBNP in the last 8760 hours. HbA1C: No results for input(s): HGBA1C in the last 72 hours. CBG: Recent Labs  Lab 11/11/19 1312 11/11/19 1426  GLUCAP 59* 102*   Lipid Profile: No results for input(s): CHOL, HDL, LDLCALC, TRIG, CHOLHDL, LDLDIRECT in the last 72 hours. Thyroid Function Tests: No results for input(s): TSH, T4TOTAL, FREET4, T3FREE, THYROIDAB in the last 72 hours. Anemia Panel: No results for input(s): VITAMINB12, FOLATE,  FERRITIN, TIBC, IRON, RETICCTPCT in the last 72 hours. Sepsis Labs: No results for input(s): PROCALCITON, LATICACIDVEN in the last 168 hours.  Recent Results (from the past 240 hour(s))  SARS CORONAVIRUS 2 (TAT 6-24 HRS) Nasopharyngeal Nasopharyngeal Swab     Status: None   Collection Time: 11/11/19  4:13 PM   Specimen: Nasopharyngeal Swab  Result Value Ref Range Status   SARS Coronavirus 2 NEGATIVE NEGATIVE Final    Comment: (NOTE) SARS-CoV-2 target nucleic acids are NOT DETECTED. The SARS-CoV-2 RNA is generally detectable in upper and lower respiratory specimens during the acute phase of infection. Negative results do not preclude SARS-CoV-2 infection, do not rule out co-infections with other pathogens, and should not be used as the sole basis for treatment or other patient management decisions. Negative results must be combined with clinical observations, patient history, and epidemiological information. The expected result is Negative. Fact Sheet for Patients: HairSlick.no Fact Sheet for Healthcare Providers: quierodirigir.com This test is not yet approved or cleared by the [place] FDA and  has been authorized for detection and/or diagnosis of SARS-CoV-2 by FDA under an Emergency Use Authorization (EUA). This EUA will remain  in effect (meaning this test can be used) for the duration of the COVID-19 declaration under Section 56 4(b)(1) of the Act, 21 U.S.C. section 360bbb-3(b)(1), unless the authorization is terminated or revoked sooner. Performed at [facility], [address], [place], [place] [address]          Radiology Studies: CT Head Wo Contrast  Result Date: 11/11/2019 CLINICAL DATA:  Altered mental status. EXAM: CT HEAD WITHOUT CONTRAST TECHNIQUE: Contiguous axial images were obtained from the base of the skull through the vertex without intravenous contrast. COMPARISON:  08/09/2019 FINDINGS:  Brain: No evidence of acute infarction, hemorrhage, hydrocephalus, extra-axial collection or mass lesion/mass effect. There is ventricular and sulcal enlargement reflecting mild to moderate atrophy. Old lacunar infarct lies in the anterior left basal ganglia. Bilateral periventricular white matter hypoattenuation is noted consistent with mild chronic microvascular ischemic change. Vascular: No hyperdense vessel or unexpected calcification. Skull: Normal. Negative for fracture or focal lesion. Sinuses/Orbits: Visualized globes and orbits are unremarkable. Mild frontal, ethmoid and right maxillary sinus mucosal thickening. Other: None. IMPRESSION: 1. No acute intracranial abnormalities. 2. Mild to moderate atrophy, old left basal ganglia lacunar infarct and mild chronic microvascular ischemic change. Electronically Signed   By: Amie Portland M.D.   On: 11/11/2019 12:01   DG Chest Portable 1 View  Result Date: 11/11/2019 CLINICAL DATA:  EMS called out the pt house with c/o pt being altered and weak this morning, normally uses a walker, pt has a hx of CVA but the care giver is unsure if the right face droop is new or not. Pt is incontinent of urine on arrival, EMS reports 3  empty beer cans on the table and the pt admits to drinking them the night before. EMS reports concerns for pt returning to the residence due to poor living conditions. EXAM: PORTABLE CHEST 1 VIEW COMPARISON:  08/11/2019 FINDINGS: Cardiac silhouette normal in size.  No mediastinal or hilar masses. Prominence of the bronchovascular markings, stable. Lungs otherwise clear. No pleural effusion or convincing pneumothorax. Skeletal structures are grossly intact. IMPRESSION: No active disease. Electronically Signed   By: Lajean Manes M.D.   On: 11/11/2019 12:46        Scheduled Meds: . chlorhexidine  15 mL Mouth Rinse BID  . enoxaparin (LOVENOX) injection  40 mg Subcutaneous Q24H  . mouth rinse  15 mL Mouth Rinse q12n4p  . multivitamin with  minerals  1 tablet Oral Daily  . pantoprazole  40 mg Oral Daily  . sodium bicarbonate  650 mg Oral BID  . sodium chloride flush  3 mL Intravenous Q12H  . thiamine  100 mg Oral Daily   Continuous Infusions: . sodium chloride 50 mL/hr at 11/12/19 0529  . cefTRIAXone (ROCEPHIN)  IV    . potassium chloride       LOS: 1 day    Time spent: 30 mins    Wyvonnia Dusky, MD [facility] Pager [phone]  If 7PM-7AM, please contact night-coverage [email or web address] 11/12/2019, 7:28 AM

## 2019-11-12 NOTE — Evaluation (Signed)
Occupational Therapy Evaluation Patient Details Name: Javier Robinson MRN: [id number] DOB: 1946/01/24 Today's Date: 11/12/2019    History of Present Illness 74 y/o M w/ PMH of alcohol abuse, HTN, GERD, h/o CVA with R -sided weakness, likely chronic hyponatremia, likely dementia who presents confusion and weakness.  Found with hyponatremia.   Clinical Impression   Patient approached for OT evaluation and agreeable to therapy.  Completed co-tx with PT due to medical complexity and for optimal safety when participating in therapeutic tasks.  OT focusing on evaluation, [facility] and ADL retraining.  Patient is a poor historian and unable to provide most PLOF.  Oriented to self.  Completed bed mobility with MAX A. Able to tolerate sitting EOB to complete simple grooming with SBA.  MOD A needed for grooming due to poor initiation of task and concentration.  Noted poor use of R UE due to previous CVA.  Able to grasp walker, but otherwise is severly limited.  MAX A x 2 to perform sit<>stand from elevated surface with heavy posterior/R lateral lean.  Unable to move feet in standing and unable to successfully perform SPT.  Patient presents with a multitude of performance component deficits and would benefit from further occupational therapy treatment.  Based on today's performance, recommending SNF with 24/7 SPV at discharge.    Follow Up Recommendations  SNF    Equipment Recommendations  Other (comment)(defer to next level of care)    Recommendations for Other Services       Precautions / Restrictions Precautions Precautions: Fall;Other (comment) Precaution Comments: aspiration precautions, no straws Restrictions Weight Bearing Restrictions: No      Mobility Bed Mobility Overal bed mobility: Needs Assistance Bed Mobility: Supine to Sit;Sit to Supine     Supine to sit: Max assist;HOB elevated Sit to supine: Max assist;+2 for physical assistance      Transfers Overall transfer level: Needs  assistance Equipment used: Rolling walker (2 wheeled) Transfers: Sit to/from Stand Sit to Stand: Max assist;+2 physical assistance;+2 safety/equipment;From elevated surface         General transfer comment: Patient unable to lift feet in standing to perform SPT    Balance Overall balance assessment: History of Falls;Needs assistance Sitting-balance support: Single extremity supported;Feet supported Sitting balance-Leahy Scale: Fair     Standing balance support: Bilateral upper extremity supported Standing balance-Leahy Scale: Zero Standing balance comment: MAX A x 1-2 to maintain standing balance                           ADL either performed or assessed with clinical judgement   ADL Overall ADL's : Needs assistance/impaired     Grooming: Wash/dry hands;Wash/dry face;Moderate assistance;Sitting           Upper Body Dressing : Minimal assistance;Sitting   Lower Body Dressing: Maximal assistance;Bed level     Toilet Transfer Details (indicate cue type and reason): Unable to perform d/t weakness Toileting- Clothing Manipulation and Hygiene: Total assistance;Bed level         General ADL Comments: Unable to perform functional transfers or mobility at this time secondary to weakness.     Vision Patient Visual Report: No change from baseline       Perception     Praxis      Pertinent Vitals/Pain Pain Assessment: No/denies pain     Hand Dominance Left   Extremity/Trunk Assessment Upper Extremity Assessment Upper Extremity Assessment: Generalized weakness;RUE deficits/detail RUE Deficits / Details: previous CVA deficits.  Poor AROM and gross grasp. RUE Sensation: (unable to state due to cognition) RUE Coordination: decreased fine motor;decreased gross motor   Lower Extremity Assessment Lower Extremity Assessment: Generalized weakness;Defer to PT evaluation   Cervical / Trunk Assessment Cervical / Trunk Assessment: Normal   Communication  Communication Communication: No difficulties;Other (comment)(Difficulty with processing/cognitive deficits)   Cognition Arousal/Alertness: Lethargic Behavior During Therapy: WFL for tasks assessed/performed;Flat affect Overall Cognitive Status: No family/caregiver present to determine baseline cognitive functioning                                 General Comments: MD questioning underlying dementia.  Patient oriented to self.   General Comments       Exercises Other Exercises Other Exercises: Provided education on role and goals of OT in acute care setting Other Exercises: Co-tx with PT for bed mobility and sit<>stand.  Required MAX A x 2 d/t overall weakness.   Shoulder Instructions      Home Living Family/patient expects to be discharged to:: Unsure("poor living conditions" via chart/EMS) Living Arrangements: Non-relatives/Friends;Other (Comment)(Per chart, pt has roommate and has a caregiver who checks on him daily)                               Additional Comments: Questionable historian.  Most PLOF gathered via chart.      Prior Functioning/Environment Level of Independence: Needs assistance  Gait / Transfers Assistance Needed: Per chart/caregiver, patient requires assistance for functional transfers but can perform functional mobility using RW ADL's / Homemaking Assistance Needed: Per patient, he was MOD I with ADLs, but per chart/caregiver, he does need assistance Communication / Swallowing Assistance Needed: uncertain Comments: Patient states he using RW for functional mobility.  Pt states he doesn't "get any help" from his roommate or caregiver        OT Problem List: Decreased strength;Decreased range of motion;Decreased activity tolerance;Impaired balance (sitting and/or standing);Decreased safety awareness;Decreased cognition;Decreased coordination;Impaired UE functional use      OT Treatment/Interventions: Self-care/ADL  training;Therapeutic exercise;Energy conservation;DME and/or AE instruction;Therapeutic activities;Patient/family education    OT Goals(Current goals can be found in the care plan section) Acute Rehab OT Goals Patient Stated Goal: Patient unable Time For Goal Achievement: 11/26/19 Potential to Achieve Goals: Fair  OT Frequency: Min 1X/week   Barriers to D/C: Other (comment)  Per EMS/chart, "Unsafe home environment".  Uncertain of how much assistance he recieves for ADLs/functional transfers/mobility       Co-evaluation PT/OT/SLP Co-Evaluation/Treatment: Yes Reason for Co-Treatment: Complexity of the patient's impairments (multi-system involvement);For patient/therapist safety;To address functional/ADL transfers PT goals addressed during session: Mobility/safety with mobility;Proper use of DME OT goals addressed during session: ADL's and self-care;Strengthening/ROM      AM-PAC OT "6 Clicks" Daily Activity     Outcome Measure Help from another person eating meals?: A Little Help from another person taking care of personal grooming?: A Lot Help from another person toileting, which includes using toliet, bedpan, or urinal?: A Lot Help from another person bathing (including washing, rinsing, drying)?: A Lot Help from another person to put on and taking off regular upper body clothing?: A Little Help from another person to put on and taking off regular lower body clothing?: A Lot 6 Click Score: 14   End of Session Equipment Utilized During Treatment: Gait belt;Rolling walker Nurse Communication: Other (comment)(Discussing overall health/lab values)  Activity Tolerance: Patient  tolerated treatment well Patient left: in bed;with call bell/phone within reach;with bed alarm set  OT Visit Diagnosis: Muscle weakness (generalized) (M62.81);History of falling (Z91.81)                Time: 1350-1426 OT Time Calculation (min): 36 min Charges:  OT General Charges $OT Visit: 1 Visit OT  Evaluation $OT Eval Moderate Complexity: 1 Mod OT Treatments $Therapeutic Activity: 8-22 mins  Baldomero Lamy, MS, OTR/L 11/12/19, 3:14 PM

## 2019-11-12 NOTE — Progress Notes (Signed)
Initial Nutrition Assessment  DOCUMENTATION CODES:   Severe malnutrition in context of chronic illness  INTERVENTION:   Nepro Shake po TID, each supplement provides 425 kcal and 19 grams protein  Magic cup TID with meals, each supplement provides 290 kcal and 9 grams of protein  MVI, folic acid and thiamine daily in setting of etoh abuse  Pt likely at high refeed risk; recommend monitor K, Mg and P labs daily as oral intake improves.   NUTRITION DIAGNOSIS:   Severe Malnutrition related to chronic illness(CHF, CVA, etoh abuse) as evidenced by 8 percent weight loss in 3 months, moderate to severe fat depletions, moderate to severe muscle depletions.  GOAL:   Patient will meet greater than or equal to 90% of their needs  MONITOR:   PO intake, Supplement acceptance, Labs, Weight trends, Skin, I & O's  REASON FOR ASSESSMENT:   Consult Assessment of nutrition requirement/status  ASSESSMENT:   74 y/o M w/ PMH of alcohol abuse, HTN, GERD, likely chronic hyponatremia, likely dementia who presents confusion and weakness.   Met with pt in room today. Pt is a poor historian so unable to provide much history. Pt reports poor appetite and oral intake at baseline. Pt does report that he enjoys strawberry Ensure. RD suspects pt with poor appetite and oral intake r/t etoh abuse. Per chart, pt has lost 13lbs(8%) over the past 3 months; this is significant. Pt seen by SLP and placed on a dysphagia 1/nectar thick diet. RD will add supplements to help pt meet his estimated needs. Pt is likely at high refeed risk.   Medications reviewed and include: lovenox, MVI, protonix, Na bicarbonate, thiamine, NaCl '@50ml' /hr, ceftriaxone   Labs reviewed: Na 123(L), K 3.4(L), BUN 6(L), creat 0.54(L), tbili 1.3(H) Hgb 9.9(L), Hct 29.7(L)  NUTRITION - FOCUSED PHYSICAL EXAM:    Most Recent Value  Orbital Region  No depletion  Upper Arm Region  Severe depletion  Thoracic and Lumbar Region  Severe depletion   Buccal Region  No depletion  Temple Region  Mild depletion  Clavicle Bone Region  Severe depletion  Clavicle and Acromion Bone Region  Severe depletion  Scapular Bone Region  Severe depletion  Dorsal Hand  Severe depletion  Patellar Region  Moderate depletion  Anterior Thigh Region  Moderate depletion  Posterior Calf Region  Moderate depletion  Edema (RD Assessment)  None  Hair  Reviewed  Eyes  Reviewed  Mouth  Reviewed  Skin  Reviewed  Nails  Reviewed     Diet Order:   Diet Order            DIET - DYS 1 Room service appropriate? Yes with Assist; Fluid consistency: Nectar Thick  Diet effective now             EDUCATION NEEDS:   No education needs have been identified at this time  Skin:  Skin Assessment: Reviewed RN Assessment  Last BM:  3/25  Height:   Ht Readings from Last 1 Encounters:  11/11/19 '5\' 11"'  (1.803 m)    Weight:   Wt Readings from Last 1 Encounters:  11/11/19 65.3 kg    Ideal Body Weight:  78 kg  BMI:  Body mass index is 20.08 kg/m.  Estimated Nutritional Needs:   Kcal:  1800-2100kcal/day  Protein:  90-105g/day  Fluid:  >1.7L/day  Koleen Distance MS, RD, LDN Contact information available in Amion

## 2019-11-12 NOTE — TOC Initial Note (Signed)
Transition of Care [facility]) - Initial/Assessment Note    Patient Details  Name: Javier Robinson MRN: [id number] Date of Birth: Sep 29, 1945  [facility]) CM/SW Contact:    Beverly Sessions, RN Phone Number: 11/12/2019, 11:19 AM  Clinical Narrative:                 Patient admitted with hyponatremia Patient alert to self, unable to provide meaningful history   RNCM spoke with Veatrice via phone who is his caregiver.  Veatrice states that patient lives with a roommate. States that roommate is able to help patient around the house with adls, and preparing meals.  Veatrice states she lives 2 doors down for the patient and comes to check on him daily.    Veatrice states at baseline patient needs assistance standing, however once he is up he is able to ambulate independently with a rolling walker.    Veatrice states that the patient's niece Jeannene Patella provides transportation to appointments. [name]'s contact information is not listed in the system.  Veatrice is to have [name] call me directly.  Veatrice states that [name] makes all of the patient's medical decisions  PT eval pending Documentation that EMS states "poor living conditions"  Patient has had [facility] home health in the past.   Of note patient's significant other passed away in 10/19/22 this year.    Will follow up on PT recommendations for discharge planning.      Patient Goals and CMS Choice        Expected Discharge Plan and Services     Discharge Planning Services: CM Consult   Living arrangements for the past 2 months: Single Family Home                                      Prior Living Arrangements/Services Living arrangements for the past 2 months: Single Family Home Lives with:: Roommate Patient language and need for interpreter reviewed:: Yes Do you feel safe going back to the place where you live?: Yes      Need for Family Participation in Patient Care: Yes (Comment) Care giver support system in  place?: Yes (comment) Current home services: DME Criminal Activity/Legal Involvement Pertinent to Current Situation/Hospitalization: No - Comment as needed  Activities of Daily Living Home Assistive Devices/Equipment: Gilford Rile (specify type) ADL Screening (condition at time of admission) Patient's cognitive ability adequate to safely complete daily activities?: No Is the patient deaf or have difficulty hearing?: No Does the patient have difficulty concentrating, remembering, or making decisions?: Yes Does the patient have difficulty dressing or bathing?: Yes Independently performs ADLs?: No Communication: Independent Dressing (OT): Needs assistance Is this a change from baseline?: Pre-admission baseline Grooming: Needs assistance Is this a change from baseline?: Pre-admission baseline Bathing: Needs assistance Is this a change from baseline?: Pre-admission baseline Toileting: Needs assistance Is this a change from baseline?: Pre-admission baseline In/Out Bed: Needs assistance Is this a change from baseline?: Pre-admission baseline Walks in Home: Independent with device (comment)(walker) Does the patient have difficulty walking or climbing stairs?: Yes Weakness of Legs: Both Weakness of Arms/Hands: Both  Permission Sought/Granted                  Emotional Assessment       Orientation: : Oriented to Self      Admission diagnosis:  Hyponatremia [E87.1] Acute encephalopathy [G93.40] Patient Active Problem List   Diagnosis Date  Noted  . Malnutrition of moderate degree 08/10/2019  . Acute encephalopathy 08/09/2019  . Alcohol dependence (HCC) 08/09/2019  . Essential hypertension 08/09/2019  . Metabolic acidosis, increased anion gap (IAG) 08/09/2019  . Prolonged QT interval 08/09/2019  . Hypertensive urgency 07/03/2019  . Acute CHF (congestive heart failure) (HCC) 06/30/2019  . Hypoglycemia 06/30/2019  . SIRS (systemic inflammatory response syndrome) (HCC) 06/30/2019   . Hyponatremia 06/30/2019  . Hypomagnesemia 06/30/2019   PCP:  [facility] Pharm[facility]:   MEDICAL [address] [place], [place] - [address] [place] [address] Phone: [phone] Fax: [phone]     Social Determinants of Health (SDOH) Interventions    Readmission Risk Interventions No flowsheet data found.
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
## 2019-11-12 NOTE — Evaluation (Signed)
Physical Therapy Evaluation Patient Details Name: Javier Robinson MRN: [id number] DOB: 12-26-1945 Today's Date: 11/12/2019   History of Present Illness  74 y/o M w/ PMH of alcohol abuse, HTN, GERD, h/o CVA with R -sided weakness, likely chronic hyponatremia, likely dementia who presents confusion and weakness.  Found with hyponatremia.  Clinical Impression  Patient received in bed, agreeable to PT evaluation.  He appears lethargic, slow to respond, decreased eye contact. Patient requires max assist for bed mobility, reports R side weakness, B LE pain. Patient requires max +2 assist for sit to stand from elevated surface. Requires max +2 assist to maintain standing balance. Patient unable to weight shift to take steps this day. He will continue to benefit from skilled PT to improve strength and functional independence.      Follow Up Recommendations SNF    Equipment Recommendations  None recommended by PT;Other (comment)(TBD)    Recommendations for Other Services       Precautions / Restrictions Precautions Precautions: Fall Precaution Comments: aspiration precautions, no straws Restrictions Weight Bearing Restrictions: No      Mobility  Bed Mobility Overal bed mobility: Needs Assistance Bed Mobility: Supine to Sit;Sit to Supine     Supine to sit: Max assist;HOB elevated Sit to supine: Max assist;+2 for physical assistance   General bed mobility comments: patient demonstrates decreased initiation of mobility  Transfers Overall transfer level: Needs assistance Equipment used: Rolling walker (2 wheeled) Transfers: Sit to/from Stand Sit to Stand: Max assist;+2 physical assistance;From elevated surface         General transfer comment: Patient unable to weight shift in standing for pivot to recliner, unable to take side steps along bed  Ambulation/Gait             General Gait Details: unable  Stairs            Wheelchair Mobility    Modified Rankin  (Stroke Patients Only)       Balance Overall balance assessment: History of Falls;Needs assistance Sitting-balance support: Feet supported;Single extremity supported Sitting balance-Leahy Scale: Fair     Standing balance support: Bilateral upper extremity supported;During functional activity Standing balance-Leahy Scale: Zero Standing balance comment: MAX A x 1-2 to maintain standing balance, leaning to right side in standing                             Pertinent Vitals/Pain Pain Assessment: Faces Faces Pain Scale: Hurts little more Pain Location: B LEs with activity Pain Descriptors / Indicators: Sore;Discomfort;Grimacing Pain Intervention(s): Monitored during session    Home Living Family/patient expects to be discharged to:: Unsure Living Arrangements: Non-relatives/Friends;Other (Comment)               Additional Comments: Questionable historian.  Most PLOF gathered via chart.    Prior Function Level of Independence: Needs assistance   Gait / Transfers Assistance Needed: Per chart/caregiver, patient requires assistance for functional transfers but can perform functional mobility using RW  ADL's / Homemaking Assistance Needed: Per patient, he was MOD I with ADLs, but per chart/caregiver, he does need assistance  Comments: Patient states he using RW for functional mobility.  Pt states he doesn't "get any help" from his roommate or caregiver     Hand Dominance   Dominant Hand: Left    Extremity/Trunk Assessment   Upper Extremity Assessment Upper Extremity Assessment: Defer to OT evaluation RUE Deficits / Details: previous CVA deficits.  Poor AROM and gross  grasp. RUE Sensation: (unable to state due to cognition) RUE Coordination: decreased fine motor;decreased gross motor    Lower Extremity Assessment Lower Extremity Assessment: Generalized weakness    Cervical / Trunk Assessment Cervical / Trunk Assessment: Normal  Communication    Communication: No difficulties;Other (comment)  Cognition Arousal/Alertness: Lethargic Behavior During Therapy: WFL for tasks assessed/performed;Flat affect Overall Cognitive Status: No family/caregiver present to determine baseline cognitive functioning                                 General Comments: when asked why he is here he reports he "couldn't walk"      General Comments      Exercises Other Exercises Other Exercises: Provided education on role and goals of OT in acute care setting Other Exercises: Co-tx with PT for bed mobility and sit<>stand.  Required MAX A x 2 d/t overall weakness.   Assessment/Plan    PT Assessment Patient needs continued PT services  PT Problem List Decreased strength;Decreased mobility;Decreased activity tolerance;Decreased balance;Pain;Decreased safety awareness;Decreased cognition       PT Treatment Interventions DME instruction;Therapeutic exercise;Gait training;Balance training;Functional mobility training;Therapeutic activities;Patient/family education    PT Goals (Current goals can be found in the Care Plan section)  Acute Rehab PT Goals Patient Stated Goal: Patient unable PT Goal Formulation: Patient unable to participate in goal setting Time For Goal Achievement: 11/26/19 Potential to Achieve Goals: Fair    Frequency Min 2X/week   Barriers to discharge Decreased caregiver support      Co-evaluation PT/OT/SLP Co-Evaluation/Treatment: Yes Reason for Co-Treatment: For patient/therapist safety;To address functional/ADL transfers PT goals addressed during session: Mobility/safety with mobility;Balance;Proper use of DME OT goals addressed during session: ADL's and self-care;Strengthening/ROM       AM-PAC PT "6 Clicks" Mobility  Outcome Measure Help needed turning from your back to your side while in a flat bed without using bedrails?: A Lot Help needed moving from lying on your back to sitting on the side of a flat bed  without using bedrails?: A Lot Help needed moving to and from a bed to a chair (including a wheelchair)?: Total Help needed standing up from a chair using your arms (e.g., wheelchair or bedside chair)?: Total Help needed to walk in hospital room?: Total Help needed climbing 3-5 steps with a railing? : Total 6 Click Score: 8    End of Session Equipment Utilized During Treatment: Gait belt Activity Tolerance: Patient limited by fatigue;Patient limited by pain Patient left: in bed;with call bell/phone within reach;with bed alarm set   PT Visit Diagnosis: Other abnormalities of gait and mobility (R26.89);Difficulty in walking, not elsewhere classified (R26.2);Unsteadiness on feet (R26.81);Muscle weakness (generalized) (M62.81);History of falling (Z91.81)    Time: 1355-1426 PT Time Calculation (min) (ACUTE ONLY): 31 min   Charges:   PT Evaluation $PT Eval Moderate Complexity: 1 Mod PT Treatments $Therapeutic Activity: 8-22 mins        Mary-Anne Polizzi, PT, GCS 11/12/19,3:18 PM

## 2019-11-13 DIAGNOSIS — E43 Unspecified severe protein-calorie malnutrition: Secondary | ICD-10-CM | POA: Insufficient documentation

## 2019-11-13 LAB — BASIC METABOLIC PANEL
Anion gap: 8 (ref 5–15)
BUN: 8 mg/dL (ref 8–23)
CO2: 25 mmol/L (ref 22–32)
Calcium: 8.6 mg/dL — ABNORMAL LOW (ref 8.9–10.3)
Chloride: 94 mmol/L — ABNORMAL LOW (ref 98–111)
Creatinine, Ser: 0.44 mg/dL — ABNORMAL LOW (ref 0.61–1.24)
GFR calc Af Amer: >60 mL/min (ref >60)
GFR calc non Af Amer: >60 mL/min (ref >60)
Glucose, Bld: 116 mg/dL — ABNORMAL HIGH (ref 70–99)
Potassium: 3.5 mmol/L (ref 3.5–5.1)
Sodium: 127 mmol/L — ABNORMAL LOW (ref 135–145)

## 2019-11-13 LAB — CBC
HCT: 26.8 % — ABNORMAL LOW (ref 39.0–52.0)
Hemoglobin: 9.3 g/dL — ABNORMAL LOW (ref 13.0–17.0)
MCH: 31.5 pg (ref 26.0–34.0)
MCHC: 34.7 g/dL (ref 30.0–36.0)
MCV: 90.8 fL (ref 80.0–100.0)
Platelets: 253 10*3/uL (ref 150–400)
RBC: 2.95 MIL/uL — ABNORMAL LOW (ref 4.22–5.81)
RDW: 16.9 % — ABNORMAL HIGH (ref 11.5–15.5)
WBC: 4.8 10*3/uL (ref 4.0–10.5)
nRBC: 0 % (ref 0.0–0.2)

## 2019-11-13 LAB — URINE CULTURE: Culture: >=100000 — AB

## 2019-11-13 LAB — SODIUM: Sodium: 128 mmol/L — ABNORMAL LOW (ref 135–145)

## 2019-11-13 NOTE — Progress Notes (Signed)
SLP F/U Note  Patient Details Name: MINORU CHAP MRN: [id number] DOB: 1946/03/06   Cancelled treatment:       Reason Eval/Treat Not Completed: (chart reviewed; consulted NSG re: status today). NSG reported good toleration of current dysphagia diet w/ Nectar consistency liquids - noted mild coughing x1 w/ Nepro per night shift(may not have been sitting up fully during intake). No swallowing difficulties noted this AM; no overt s/s of aspiration. ST services will f/u on Monday; suspect MBSS will be ordered at that time to objectively assess pt's swallowing function for recommendation of least restrictive po diet consistency(liquids). Recommend continue current diet for now; aspiration precautions as posted. NSG agreed.     Jerilynn Som, MS, CCC-SLP Annakate Soulier 11/13/2019, 12:16 PM

## 2019-11-13 NOTE — Progress Notes (Signed)
PROGRESS NOTE    Javier Robinson  QIO:[id number] DOB: Apr 16, 1946 DOA: 11/11/2019 PCP: [facility], Wing:   Active Problems:   Hyponatremia   Protein-calorie malnutrition, severe  Hyponatremia: likely chronic. Continue on IVFs. Repeat Na level ordered. Will continue to monitor closely  Alcohol abuse: will continue CIWA protocol. Alcohol cessation counseling  Acute encephalopathy: ddx Wernicke's vs UTI vs dementia. Possible hx of dementia as per pt's caregiver.  CT brain showed no acute intracranial abnormalities. Ammonia level is WNL. Will continue on thiamine.   Metabolic acidosis: resolved  Normocytic anemia: no need for a transfusion at this time. Will continue to monitor   UTI: UA is positive, urine cx growing proteus, sens pending. Continue on IV rocephin   Hypokalemia: WNL today. Will continue to monitor  Dysphagia: continue on nectar thick dysphagia diet as per speech  Generalized weakness: PT/OT recs SNF   Prolonged QT interval: etiology unclear. Will continue on tele   GERD: continue on PPI     DVT prophylaxis: lovenox Code Status: full Family Communication:  Disposition Plan: will likely d/c to SNF when medically stable   Consultants:      Procedures:    Antimicrobials: rocephin   Subjective: Pt c/o fatigue  Objective: Vitals:   11/12/19 1203 11/12/19 1925 11/13/19 0513 11/13/19 1137  BP: 107/64 129/68 118/71 120/63  Pulse: 99 (!) 105 92 91  Resp: 15 17 17 20   Temp: 98.4 F (36.9 C) 98 F (36.7 C) 98 F (36.7 C) (!) 97.5 F (36.4 C)  TempSrc: Oral Oral Oral Oral  SpO2: 100% 97% 98% 100%  Weight:      Height:        Intake/Output Summary (Last 24 hours) at 11/13/2019 1332 Last data filed at 11/13/2019 1224 Gross per 24 hour  Intake 1802.46 ml  Output 650 ml  Net 1152.46 ml   Filed Weights   11/11/19 1624 11/11/19 1635 11/11/19 2141  Weight: 62.8 kg 62.8 kg 65.3 kg     Examination:  General exam: Appears calm and comfortable  Respiratory system: Clear to auscultation. No rhonchi Cardiovascular system: S1 & S2 +. No rubs, gallops or clicks.  Gastrointestinal system: Abdomen is nondistended, soft and nontender. Hypoactive bowel sounds heard. Central nervous system: Alert and oriented. Moves all 4 extremities  Psychiatry: Judgement and insight appear abnormal. Flat mood and affect     Data Reviewed: I have personally reviewed following labs and imaging studies  CBC: Recent Labs  Lab 11/11/19 1059 11/12/19 0545 11/13/19 0452  WBC 5.1 4.9 4.8  HGB 11.8* 9.9* 9.3*  HCT 34.6* 29.7* 26.8*  MCV 92.8 92.5 90.8  PLT 233 237 324   Basic Metabolic Panel: Recent Labs  Lab 11/11/19 1059 11/11/19 2146 11/12/19 0545 11/12/19 1329 11/13/19 0452  NA 120* 122* 123* 124* 127*  K 3.9  --  3.4*  --  3.5  CL 86*  --  90*  --  94*  CO2 21*  --  21*  --  25  GLUCOSE 58*  --  70  --  116*  BUN 8  --  6*  --  8  CREATININE 0.52*  --  0.54*  --  0.44*  CALCIUM 9.2  --  8.7*  --  8.6*   GFR: Estimated Creatinine Clearance: 72.6 mL/min (A) (by C-G formula based on SCr of 0.44 mg/dL (L)). Liver Function Tests: Recent Labs  Lab 11/11/19 1059 11/12/19 0545  AST 39 29  ALT 13 10  ALKPHOS 81 68  BILITOT 1.3* 1.3*  PROT 8.7* 7.0  ALBUMIN 3.5 2.9*   No results for input(s): LIPASE, AMYLASE in the last 168 hours. Recent Labs  Lab 11/11/19 1251  AMMONIA 19   Coagulation Profile: No results for input(s): INR, PROTIME in the last 168 hours. Cardiac Enzymes: No results for input(s): CKTOTAL, CKMB, CKMBINDEX, TROPONINI in the last 168 hours. BNP (last 3 results) No results for input(s): PROBNP in the last 8760 hours. HbA1C: No results for input(s): HGBA1C in the last 72 hours. CBG: Recent Labs  Lab 11/11/19 1312 11/11/19 1426  GLUCAP 59* 102*   Lipid Profile: No results for input(s): CHOL, HDL, LDLCALC, TRIG, CHOLHDL, LDLDIRECT in the last  72 hours. Thyroid Function Tests: No results for input(s): TSH, T4TOTAL, FREET4, T3FREE, THYROIDAB in the last 72 hours. Anemia Panel: No results for input(s): VITAMINB12, FOLATE, FERRITIN, TIBC, IRON, RETICCTPCT in the last 72 hours. Sepsis Labs: No results for input(s): PROCALCITON, LATICACIDVEN in the last 168 hours.  Recent Results (from the past 240 hour(s))  Urine culture     Status: Abnormal (Preliminary result)   Collection Time: 11/11/19  3:47 PM   Specimen: Urine, Random  Result Value Ref Range Status   Specimen Description   Final    URINE, RANDOM Performed at [facility], [address], [place], [place] [address]    Special Requests   Final    NONE Performed at [facility], [address], [place], [place] [address]    Culture (A)  Final    >=100,000 COLONIES/mL PROTEUS MIRABILIS SUSCEPTIBILITIES TO FOLLOW Performed at [facility], [address] [place], [place] [address]    Report Status PENDING  Incomplete  SARS CORONAVIRUS 2 (TAT 6-24 HRS) Nasopharyngeal Nasopharyngeal Swab     Status: None   Collection Time: 11/11/19  4:13 PM   Specimen: Nasopharyngeal Swab  Result Value Ref Range Status   SARS Coronavirus 2 NEGATIVE NEGATIVE Final    Comment: (NOTE) SARS-CoV-2 target nucleic acids are NOT DETECTED. The SARS-CoV-2 RNA is generally detectable in upper and lower respiratory specimens during the acute phase of infection. Negative results do not preclude SARS-CoV-2 infection, do not rule out co-infections with other pathogens, and should not be used as the sole basis for treatment or other patient management decisions. Negative results must be combined with clinical observations, patient history, and epidemiological information. The expected result is Negative. Fact Sheet for Patients: HairSlick.no Fact Sheet for Healthcare Providers: quierodirigir.com This test is not yet  approved or cleared by the [place] FDA and  has been authorized for detection and/or diagnosis of SARS-CoV-2 by FDA under an Emergency Use Authorization (EUA). This EUA will remain  in effect (meaning this test can be used) for the duration of the COVID-19 declaration under Section 56 4(b)(1) of the Act, 21 U.S.C. section 360bbb-3(b)(1), unless the authorization is terminated or revoked sooner. Performed at [facility], [address] [place], [place] [address]          Radiology Studies: No results found.      Scheduled Meds: . chlorhexidine  15 mL Mouth Rinse BID  . enoxaparin (LOVENOX) injection  40 mg Subcutaneous Q24H  . feeding supplement (NEPRO CARB STEADY)  237 mL Oral TID BM  . mouth rinse  15 mL Mouth Rinse q12n4p  . multivitamin with minerals  1 tablet Oral Daily  . pantoprazole  40 mg  Oral Daily  . sodium bicarbonate  650 mg Oral BID  . sodium chloride flush  3 mL Intravenous Q12H  . thiamine  100 mg Oral Daily   Continuous Infusions: . sodium chloride 50 mL/hr at 11/13/19 1224  . cefTRIAXone (ROCEPHIN)  IV 1 g (11/13/19 1224)     LOS: 2 days    Time spent: 30 mins    Charise Killian, MD [facility] Pager [phone]  If 7PM-7AM, please contact night-coverage [email or web address] 11/13/2019, 1:32 PM

## 2019-11-13 NOTE — TOC Progression Note (Signed)
Transition of Care [facility]) - Progression Note    Patient Details  Name: Javier Robinson MRN: [id number] Date of Birth: 08-Apr-1946  [facility] CM/SW Contact  Maree Krabbe, LCSW Phone Number: 11/13/2019, 1:00 PM  Clinical Narrative:  CSW spoke with [name] pt's niece and she wants pt to go to [facility]. CSW will send referral. Pt will need prior auth and a covid test within 48 hours of d/c.          Expected Discharge Plan and Services     Discharge Planning Services: CM Consult   Living arrangements for the past 2 months: Single Family Home                                       Social Determinants of Health (SDOH) Interventions    Readmission Risk Interventions No flowsheet data found.

## 2019-11-13 NOTE — NC FL2 (Signed)
[place] LEVEL OF CARE SCREENING TOOL     IDENTIFICATION  Patient Name: Javier Robinson Birthdate: Jan 10, 1946 Sex: male Admission Date (Current Location): 11/11/2019  [facility] and [place] Number:  (Simultaneous filing. User may not have seen previous data.) [id number] P Facility and Address:  [facility], [address] [place], [place] [address]      Provider Number: [id number](Simultaneous filing. User may not have seen previous data.)  Attending Physician Name and Address:  Wyvonnia Dusky, MD  Relative Name and Phone Number:  [place] spoke with [facility] pt's niece    Current Level of Care: Hospital Recommended Level of Care: [place] Prior Approval Number:    Date Approved/Denied:   PASRR Number: [id number]  Discharge Plan: SNF    Current Diagnoses: Patient Active Problem List   Diagnosis Date Noted  . Protein-calorie malnutrition, severe 11/13/2019  . Malnutrition of moderate degree 08/10/2019  . Acute encephalopathy 08/09/2019  . Alcohol dependence ([place]) 08/09/2019  . Essential hypertension 08/09/2019  . Metabolic acidosis, increased anion gap (IAG) 08/09/2019  . Prolonged QT interval 08/09/2019  . Hypertensive urgency 07/03/2019  . Acute CHF (congestive heart failure) ([place]) 06/30/2019  . Hypoglycemia 06/30/2019  . SIRS (systemic inflammatory response syndrome) ([place]) 06/30/2019  . Hyponatremia 06/30/2019  . Hypomagnesemia 06/30/2019    Orientation RESPIRATION BLADDER Height & Weight     Self  Normal External catheter Weight: 143 lb 15.4 oz (65.3 kg) Height:  5\' 11"  (180.3 cm)  BEHAVIORAL SYMPTOMS/MOOD NEUROLOGICAL BOWEL NUTRITION STATUS      Continent Diet  AMBULATORY STATUS COMMUNICATION OF NEEDS Skin   Extensive Assist Verbally Normal                       Personal Care Assistance Level of Assistance  Bathing, Feeding, Dressing Bathing Assistance: Maximum assistance Feeding assistance:  Limited assistance Dressing Assistance: Maximum assistance     Functional Limitations Info  Sight, Hearing, Speech Sight Info: Adequate Hearing Info: Adequate Speech Info: Adequate    SPECIAL CARE FACTORS FREQUENCY  PT (By licensed PT), OT (By licensed OT)     PT Frequency: x5 weekiy OT Frequency: x5 weekly            Contractures Contractures Info: Not present    Additional Factors Info  Code Status, Allergies Code Status Info: full Allergies Info: No Known allergies           Current Medications (11/13/2019):  This is the current hospital active medication list Current Facility-Administered Medications  Medication Dose Route Frequency Provider Last Rate Last Admin  . 0.9 %  sodium chloride infusion   Intravenous Continuous Wyvonnia Dusky, MD 50 mL/hr at 11/13/19 1224 Rate Verify at 11/13/19 1224  . bisacodyl (DULCOLAX) EC tablet 5 mg  5 mg Oral Daily PRN Wyvonnia Dusky, MD      . cefTRIAXone (ROCEPHIN) 1 g in sodium chloride 0.9 % 100 mL IVPB  1 g Intravenous Q24H Wyvonnia Dusky, MD 200 mL/hr at 11/13/19 1224 1 g at 11/13/19 1224  . chlorhexidine (PERIDEX) 0.12 % solution 15 mL  15 mL Mouth Rinse BID Ralene Muskrat B, MD   15 mL at 11/13/19 0954  . enoxaparin (LOVENOX) injection 40 mg  40 mg Subcutaneous Q24H Wyvonnia Dusky, MD   40 mg at 11/12/19 2344  . feeding supplement (NEPRO CARB STEADY) liquid 237 mL  237 mL Oral TID BM Wyvonnia Dusky, MD   237 mL  at 11/13/19 0955  . LORazepam (ATIVAN) tablet 1 mg  1 mg Oral Q6H PRN Charise Killian, MD      . MEDLINE mouth rinse  15 mL Mouth Rinse q12n4p Georgeann Oppenheim, Sudheer B, MD   15 mL at 11/13/19 1225  . multivitamin with minerals tablet 1 tablet  1 tablet Oral Daily Charise Killian, MD   1 tablet at 11/13/19 [phone]  . oxyCODONE (Oxy IR/ROXICODONE) immediate release tablet 5 mg  5 mg Oral Q6H PRN Charise Killian, MD      . pantoprazole (PROTONIX) EC tablet 40 mg  40 mg Oral Daily Charise Killian, MD   40 mg at 11/13/19 0955  . scopolamine (TRANSDERM-SCOP) 1 MG/3DAYS 1.5 mg  1 patch Transdermal Q72H PRN Charise Killian, MD      . sodium bicarbonate tablet 650 mg  650 mg Oral BID Charise Killian, MD   650 mg at 11/13/19 0954  . sodium chloride flush (NS) 0.9 % injection 3 mL  3 mL Intravenous Q12H Charise Killian, MD   3 mL at 11/12/19 2345  . thiamine tablet 100 mg  100 mg Oral Daily Charise Killian, MD   100 mg at 11/13/19 8295     Discharge Medications: Please see discharge summary for a list of discharge medications.  Relevant Imaging Results:  Relevant Lab Results:   Additional Information [id number]  Maree Krabbe, LCSW

## 2019-11-14 LAB — CBC
HCT: 26.1 % — ABNORMAL LOW (ref 39.0–52.0)
Hemoglobin: 8.9 g/dL — ABNORMAL LOW (ref 13.0–17.0)
MCH: 31 pg (ref 26.0–34.0)
MCHC: 34.1 g/dL (ref 30.0–36.0)
MCV: 90.9 fL (ref 80.0–100.0)
Platelets: 254 10*3/uL (ref 150–400)
RBC: 2.87 MIL/uL — ABNORMAL LOW (ref 4.22–5.81)
RDW: 17.2 % — ABNORMAL HIGH (ref 11.5–15.5)
WBC: 4.4 10*3/uL (ref 4.0–10.5)
nRBC: 0 % (ref 0.0–0.2)

## 2019-11-14 LAB — BASIC METABOLIC PANEL
Anion gap: 6 (ref 5–15)
BUN: 9 mg/dL (ref 8–23)
CO2: 25 mmol/L (ref 22–32)
Calcium: 8.6 mg/dL — ABNORMAL LOW (ref 8.9–10.3)
Chloride: 99 mmol/L (ref 98–111)
Creatinine, Ser: 0.42 mg/dL — ABNORMAL LOW (ref 0.61–1.24)
GFR calc Af Amer: >60 mL/min (ref >60)
GFR calc non Af Amer: >60 mL/min (ref >60)
Glucose, Bld: 99 mg/dL (ref 70–99)
Potassium: 3.7 mmol/L (ref 3.5–5.1)
Sodium: 130 mmol/L — ABNORMAL LOW (ref 135–145)

## 2019-11-14 MED ORDER — GUAIFENESIN-DM 100-10 MG/5ML PO SYRP
5.0000 mL | ORAL_SOLUTION | ORAL | Status: DC | PRN
  Filled 2019-11-14: qty 5

## 2019-11-14 MED ORDER — DOCUSATE SODIUM 100 MG PO CAPS
200.0000 mg | ORAL_CAPSULE | Freq: Two times a day (BID) | ORAL | Status: DC
  Administered 2019-11-14 – 2019-11-15 (×3): 200 mg via ORAL
  Filled 2019-11-14 (×3): qty 2

## 2019-11-14 NOTE — Progress Notes (Signed)
PROGRESS NOTE    Javier Robinson  ZOX:[id number] DOB: 1945/09/08 DOA: 11/11/2019 PCP: [facility] [place]:   Active Problems:   Hyponatremia   Protein-calorie malnutrition, severe  Hyponatremia: likely chronic. Trending up slowly. Will continue to monitor closely  Alcohol abuse: will continue CIWA protocol. Alcohol cessation counseling  Acute encephalopathy: ddx Wernicke's vs UTI vs dementia. Possible hx of dementia as per pt's caregiver.  CT brain showed no acute intracranial abnormalities. Ammonia level is WNL. Will continue on thiamine. Oriented to person and place  Metabolic acidosis: resolved  Normocytic anemia: no need for a transfusion at this time. Will continue to monitor   UTI: UA is positive, urine cx growing proteus mirabilis. Continue on IV rocephin   Hypokalemia: WNL today. Will continue to monitor  Dysphagia: continue on nectar thick dysphagia diet as per speech  Generalized weakness: PT/OT recs SNF   Prolonged QT interval: etiology unclear. Will continue on tele   GERD: continue on PPI     DVT prophylaxis: lovenox Code Status: full Family Communication:  Disposition Plan: medically stable for transfer to SNF. CM is working on placement    Consultants:      Procedures:    Antimicrobials: rocephin   Subjective: Pt c/o malaise   Objective: Vitals:   11/13/19 0513 11/13/19 1137 11/13/19 1933 11/14/19 0413  BP: 118/71 120/63 128/67 126/75  Pulse: 92 91 94 79  Resp: 17 20 18 18   Temp: 98 F (36.7 C) (!) 97.5 F (36.4 C) 98.7 F (37.1 C) 97.9 F (36.6 C)  TempSrc: Oral Oral Oral Oral  SpO2: 98% 100% 100% 100%  Weight:      Height:        Intake/Output Summary (Last 24 hours) at 11/14/2019 0806 Last data filed at 11/14/2019 0104 Gross per 24 hour  Intake 428.84 ml  Output 950 ml  Net -521.16 ml   Filed Weights   11/11/19 1624 11/11/19 1635 11/11/19 2141  Weight: 62.8 kg 62.8 kg  65.3 kg    Examination:  General exam: Appears calm and comfortable  Respiratory system: Clear to auscultation. No wheezes, rhonchi Cardiovascular system: S1 & S2 +. No rubs, gallops or clicks.  Gastrointestinal system: Abdomen is nondistended, soft and nontender. Hypoactive bowel sounds heard. Central nervous system: Alert and oriented. Moves all 4 extremities  Psychiatry: Judgement and insight appear abnormal. Flat mood and affect     Data Reviewed: I have personally reviewed following labs and imaging studies  CBC: Recent Labs  Lab 11/11/19 1059 11/12/19 0545 11/13/19 0452 11/14/19 0521  WBC 5.1 4.9 4.8 4.4  HGB 11.8* 9.9* 9.3* 8.9*  HCT 34.6* 29.7* 26.8* 26.1*  MCV 92.8 92.5 90.8 90.9  PLT 233 237 253 811   Basic Metabolic Panel: Recent Labs  Lab 11/11/19 1059 11/11/19 2146 11/12/19 0545 11/12/19 1329 11/13/19 0452 11/13/19 1420 11/14/19 0521  NA 120*   < > 123* 124* 127* 128* 130*  K 3.9  --  3.4*  --  3.5  --  3.7  CL 86*  --  90*  --  94*  --  99  CO2 21*  --  21*  --  25  --  25  GLUCOSE 58*  --  70  --  116*  --  99  BUN 8  --  6*  --  8  --  9  CREATININE 0.52*  --  0.54*  --  0.44*  --  0.42*  CALCIUM 9.2  --  8.7*  --  8.6*  --  8.6*   < > = values in this interval not displayed.   GFR: Estimated Creatinine Clearance: 72.6 mL/min (A) (by C-G formula based on SCr of 0.42 mg/dL (L)). Liver Function Tests: Recent Labs  Lab 11/11/19 1059 11/12/19 0545  AST 39 29  ALT 13 10  ALKPHOS 81 68  BILITOT 1.3* 1.3*  PROT 8.7* 7.0  ALBUMIN 3.5 2.9*   No results for input(s): LIPASE, AMYLASE in the last 168 hours. Recent Labs  Lab 11/11/19 1251  AMMONIA 19   Coagulation Profile: No results for input(s): INR, PROTIME in the last 168 hours. Cardiac Enzymes: No results for input(s): CKTOTAL, CKMB, CKMBINDEX, TROPONINI in the last 168 hours. BNP (last 3 results) No results for input(s): PROBNP in the last 8760 hours. HbA1C: No results for input(s):  HGBA1C in the last 72 hours. CBG: Recent Labs  Lab 11/11/19 1312 11/11/19 1426  GLUCAP 59* 102*   Lipid Profile: No results for input(s): CHOL, HDL, LDLCALC, TRIG, CHOLHDL, LDLDIRECT in the last 72 hours. Thyroid Function Tests: No results for input(s): TSH, T4TOTAL, FREET4, T3FREE, THYROIDAB in the last 72 hours. Anemia Panel: No results for input(s): VITAMINB12, FOLATE, FERRITIN, TIBC, IRON, RETICCTPCT in the last 72 hours. Sepsis Labs: No results for input(s): PROCALCITON, LATICACIDVEN in the last 168 hours.  Recent Results (from the past 240 hour(s))  Urine culture     Status: Abnormal   Collection Time: 11/11/19  3:47 PM   Specimen: Urine, Random  Result Value Ref Range Status   Specimen Description   Final    URINE, RANDOM Performed at [facility], [address] [place], [place] [address]    Special Requests   Final    NONE Performed at [facility], [address] [place], [place] [address]    Culture >=100,000 COLONIES/mL PROTEUS MIRABILIS (A)  Final   Report Status 11/13/2019 FINAL  Final   Organism ID, Bacteria PROTEUS MIRABILIS (A)  Final      Susceptibility   Proteus mirabilis - MIC*    AMPICILLIN <=2 SENSITIVE Sensitive     CEFAZOLIN <=4 SENSITIVE Sensitive     CEFTRIAXONE <=0.25 SENSITIVE Sensitive     CIPROFLOXACIN <=0.25 SENSITIVE Sensitive     GENTAMICIN <=1 SENSITIVE Sensitive     IMIPENEM 4 SENSITIVE Sensitive     NITROFURANTOIN 128 RESISTANT Resistant     TRIMETH/SULFA <=20 SENSITIVE Sensitive     AMPICILLIN/SULBACTAM <=2 SENSITIVE Sensitive     PIP/TAZO <=4 SENSITIVE Sensitive     * >=100,000 COLONIES/mL PROTEUS MIRABILIS  SARS CORONAVIRUS 2 (TAT 6-24 HRS) Nasopharyngeal Nasopharyngeal Swab     Status: None   Collection Time: 11/11/19  4:13 PM   Specimen: Nasopharyngeal Swab  Result Value Ref Range Status   SARS Coronavirus 2 NEGATIVE NEGATIVE Final    Comment: (NOTE) SARS-CoV-2 target nucleic acids are NOT  DETECTED. The SARS-CoV-2 RNA is generally detectable in upper and lower respiratory specimens during the acute phase of infection. Negative results do not preclude SARS-CoV-2 infection, do not rule out co-infections with other pathogens, and should not be used as the sole basis for treatment or other patient management decisions. Negative results must be combined with clinical observations, patient history, and epidemiological information. The expected result is Negative. Fact Sheet for Patients: HairSlick.no Fact Sheet for Healthcare Providers: quierodirigir.com This test is not yet approved or cleared by the [place] FDA and  has been authorized for detection and/or diagnosis of SARS-CoV-2 by FDA under an Emergency Use Authorization (EUA). This EUA will remain  in effect (meaning this test can be used) for the duration of the COVID-19 declaration under Section 56 4(b)(1) of the Act, 21 U.S.C. section 360bbb-3(b)(1), unless the authorization is terminated or revoked sooner. Performed at [facility], [address], [place], [place] [address]          Radiology Studies: No results found.      Scheduled Meds: . chlorhexidine  15 mL Mouth Rinse BID  . enoxaparin (LOVENOX) injection  40 mg Subcutaneous Q24H  . feeding supplement (NEPRO CARB STEADY)  237 mL Oral TID BM  . mouth rinse  15 mL Mouth Rinse q12n4p  . multivitamin with minerals  1 tablet Oral Daily  . pantoprazole  40 mg Oral Daily  . sodium bicarbonate  650 mg Oral BID  . sodium chloride flush  3 mL Intravenous Q12H  . thiamine  100 mg Oral Daily   Continuous Infusions: . cefTRIAXone (ROCEPHIN)  IV 1 g (11/13/19 1224)     LOS: 3 days    Time spent: 30 mins    Charise Killian, MD [facility] Pager [phone]  If 7PM-7AM, please contact night-coverage [email or web address] 11/14/2019, 8:06 AM

## 2019-11-14 NOTE — TOC Progression Note (Signed)
Transition of Care [facility]) - Progression Note    Patient Details  Name: Javier Robinson MRN: [id number] Date of Birth: 10-23-1945  [facility] CM/SW Contact  Maree Krabbe, LCSW Phone Number: 11/14/2019, 3:57 PM  Clinical Narrative:  [facility] has accepted pt and auth has been initiated.          Expected Discharge Plan and Services     Discharge Planning Services: CM Consult   Living arrangements for the past 2 months: Single Family Home                                       Social Determinants of Health (SDOH) Interventions    Readmission Risk Interventions No flowsheet data found.

## 2019-11-15 DIAGNOSIS — R1319 Other dysphagia: Secondary | ICD-10-CM

## 2019-11-15 DIAGNOSIS — L8990 Pressure ulcer of unspecified site, unspecified stage: Secondary | ICD-10-CM | POA: Insufficient documentation

## 2019-11-15 DIAGNOSIS — R531 Weakness: Secondary | ICD-10-CM

## 2019-11-15 LAB — BLOOD GAS, VENOUS
Acid-base deficit: 2.4 mmol/L — ABNORMAL HIGH (ref 0.0–2.0)
Bicarbonate: 23.2 mmol/L (ref 20.0–28.0)
O2 Saturation: 21.6 %
Patient temperature: 37
pCO2, Ven: 42 mmHg — ABNORMAL LOW (ref 44.0–60.0)
pH, Ven: 7.35 (ref 7.250–7.430)

## 2019-11-15 LAB — BASIC METABOLIC PANEL
Anion gap: 6 (ref 5–15)
BUN: 11 mg/dL (ref 8–23)
CO2: 25 mmol/L (ref 22–32)
Calcium: 8.8 mg/dL — ABNORMAL LOW (ref 8.9–10.3)
Chloride: 98 mmol/L (ref 98–111)
Creatinine, Ser: 0.43 mg/dL — ABNORMAL LOW (ref 0.61–1.24)
GFR calc Af Amer: >60 mL/min (ref >60)
GFR calc non Af Amer: >60 mL/min (ref >60)
Glucose, Bld: 94 mg/dL (ref 70–99)
Potassium: 3.9 mmol/L (ref 3.5–5.1)
Sodium: 129 mmol/L — ABNORMAL LOW (ref 135–145)

## 2019-11-15 LAB — CBC
HCT: 26.2 % — ABNORMAL LOW (ref 39.0–52.0)
Hemoglobin: 8.7 g/dL — ABNORMAL LOW (ref 13.0–17.0)
MCH: 31.2 pg (ref 26.0–34.0)
MCHC: 33.2 g/dL (ref 30.0–36.0)
MCV: 93.9 fL (ref 80.0–100.0)
Platelets: 294 10*3/uL (ref 150–400)
RBC: 2.79 MIL/uL — ABNORMAL LOW (ref 4.22–5.81)
RDW: 17.6 % — ABNORMAL HIGH (ref 11.5–15.5)
WBC: 4.3 10*3/uL (ref 4.0–10.5)
nRBC: 0 % (ref 0.0–0.2)

## 2019-11-15 MED ORDER — CIPROFLOXACIN HCL 500 MG PO TABS: 500.0000 mg | ORAL_TABLET | Freq: Two times a day (BID) | ORAL | 0 refills | Status: AC

## 2019-11-15 MED ORDER — PANTOPRAZOLE SODIUM 40 MG PO TBEC: 40.0000 mg | DELAYED_RELEASE_TABLET | Freq: Every day | ORAL | 0 refills | Status: DC

## 2019-11-15 MED ORDER — ENSURE ENLIVE PO LIQD
237.0000 mL | Freq: Two times a day (BID) | ORAL | Status: DC
  Administered 2019-11-15: 12:00:00 237 mL via ORAL

## 2019-11-15 NOTE — Plan of Care (Signed)
The patient has been discharged to SNF. IV removed. Report given to nurse Maudie Mercury from [facility]. The patient has been transported via EMS.  Vitals stable upon discharge.  Problem: Education: Goal: Knowledge of General Education information will improve Description: Including pain rating scale, medication(s)/side effects and non-pharmacologic comfort measures Outcome: Completed/Met   Problem: Health Behavior/Discharge Planning: Goal: Ability to manage health-related needs will improve Outcome: Completed/Met   Problem: Clinical Measurements: Goal: Ability to maintain clinical measurements within normal limits will improve Outcome: Completed/Met Goal: Will remain free from infection Outcome: Completed/Met Goal: Diagnostic test results will improve Outcome: Completed/Met Goal: Respiratory complications will improve Outcome: Completed/Met Goal: Cardiovascular complication will be avoided Outcome: Completed/Met   Problem: Activity: Goal: Risk for activity intolerance will decrease Outcome: Completed/Met   Problem: Nutrition: Goal: Adequate nutrition will be maintained Outcome: Completed/Met   Problem: Coping: Goal: Level of anxiety will decrease Outcome: Completed/Met   Problem: Elimination: Goal: Will not experience complications related to bowel motility Outcome: Completed/Met Goal: Will not experience complications related to urinary retention Outcome: Completed/Met   Problem: Pain Managment: Goal: General experience of comfort will improve Outcome: Completed/Met   Problem: Safety: Goal: Ability to remain free from injury will improve Outcome: Completed/Met   Problem: Skin Integrity: Goal: Risk for impaired skin integrity will decrease Outcome: Completed/Met

## 2019-11-15 NOTE — Progress Notes (Signed)
Speech Language Pathology Treatment: Dysphagia  Patient Details Name: Javier Robinson MRN: [id number] DOB: 06-Apr-1946 Today's Date: 11/15/2019 Time: 1015-1100 SLP Time Calculation (min) (ACUTE ONLY): 45 min  Assessment / Plan / Recommendation Clinical Impression  Pt seen for ongoing assessment of swallowing; toleration of diet and trials to upgrade diet to thin liquids if appropriate, safe. Pt has an extremely loose lower front tooth - recommend continue a puree foods diet d/t poor Dentition status overall. Pt is awake, verbally responsive when asked a question but did not offer much other engagement. Pt followed instructions w/ cues but required verbal cues consistently during session for follow through.  Pt positioned upright for oral intake(w/ verbal cues given). Pt then helped to Hold Cup to drink - No Straws used. Pt consumed trials of thin liquids instructed to take Small, Single sips each time, however, pt fairly consistently took larger sips 2 at a time. As trials and verbal cues continued, pt appeared to take min Smaller sips w/ trials. Audible swallows noted; NO overt coughing or wet vocal quality followed trials; no decline in respiratory status noted during/post session. Time spent w/ pt on education of aspiration precautions: Small, Single sips Slowly; need NOT to use straws; sit up straight to eat/drink; reduce distractions during oral intake to focus on aspiration precautions.  Pt appears to continue to present w/ risk for aspiration w/ thin liquids, however, w/ following aspiration precautions, risk is reduced. Pt requires monitoring during oral intake for verbal cues and follow through w/ aspiration precautions. Recommend a Dysphagia level 1 (Puree) diet (d/t Poor Dentition Status and loose lower front tooth) w/ Thin liquids VIA CUP ONLY; pills given in Puree for safer swallowing; aspiration precautions as given education on and posted in room. NSG/MD updated. ST services will continue to  follow for toleration of diet; education.      HPI HPI: Pt is a 74 y.o. male with past medical history including chronic alcohol dependence, Malnutrition of moderate degree, CHF, Metabolic acidosis, increased anion gap (IAG), chronic hyponatremia, here with reported altered mental status.  History is somewhat limited due to confusion.  However, per report, the patient is more confused and weak than usual.  The patient's caregiver came to check on him today.  He was unable to walk with a walker.  He was found with multiple beers around him.  Head CT: "Mild to moderate atrophy, old left basal ganglia lacunar infarct and chronic ischemic changes".  CXR: "no active disease".       SLP Plan  Continue with current plan of care       Recommendations  Diet recommendations: Dysphagia 1 (puree);Thin liquid Liquids provided via: Cup;No straw Medication Administration: Whole meds with puree(for safer swallowing) Supervision: Patient able to self feed;Intermittent supervision to cue for compensatory strategies Compensations: Minimize environmental distractions;Slow rate;Small sips/bites;Lingual sweep for clearance of pocketing;Multiple dry swallows after each bite/sip;Follow solids with liquid Postural Changes and/or Swallow Maneuvers: Out of bed for meals;Seated upright 90 degrees;Upright 30-60 min after meal                General recommendations: (Dietician f/u for support) Oral Care Recommendations: Oral care BID;Oral care before and after PO;Staff/trained caregiver to provide oral care Follow up Recommendations: None(TBD) SLP Visit Diagnosis: Dysphagia, oropharyngeal phase (R13.12)(cognitive decline; ETOH abuse) Plan: Continue with current plan of care       GO                 Orinda Kenner, MS,  CCC-SLP Tatem Holsonback 11/15/2019, 11:11 AM

## 2019-11-15 NOTE — Progress Notes (Signed)
PT Cancellation Note  Patient Details Name: Javier Robinson MRN: [id number] DOB: 1945/11/14   Cancelled Treatment:    Reason Eval/Treat Not Completed: Other (comment). Pt currently eating breakfast, will reattempt another time.   Archie Shea 11/15/2019, 9:11 AM Elizabeth Palau, PT, DPT [phone]

## 2019-11-15 NOTE — Care Management Important Message (Signed)
Important Message  Patient Details  Name: Javier Robinson MRN: [id number] Date of Birth: [birth year]   Medicare Important Message Given:  Yes     Johnell Comings 11/15/2019, 1:35 PM

## 2019-11-15 NOTE — Discharge Summary (Addendum)
Physician Discharge Summary  Javier Robinson NAT:[id number] DOB: 03/28/1946 DOA: 11/11/2019  PCP: [facility]  Admit date: 11/11/2019 Discharge date: 11/15/2019  Admitted From: home Disposition:  SNF  Recommendations for Outpatient Follow-up:  1. 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 Pt is a very poor historian. Pt is oriented to person, place only. Hx was initially obtained from pt. Pt denies any fever, chills, sweating, chest pain, shortness of breath, nausea, vomiting, abd pain, dysuria, diarrhea or constipation. As per pt's intermittent caregiver, pt was not himself today and not being able to stand or walk. Pt evidently still drinks alcohol but it is uncertain of much or how often. Pt states he last drank approx 1 month ago but pt's caregiver stated the pt was drinking of alcohol 2-3 days prior to admission. Of note, pt has been having weakness that has become progressively worse for at least a week. Pt has a roommate that stays with the pt most of the time but when the roommate leaves, the pt's caregiver checks on the pt  Pt was started on IVFs for hyponatremia (likely chronic) which improved slowly. Encouraged oral hydration. Furthermore, pt was placed on CIWA protocol for hx of alcohol abuse but this has since been d/c. Pt did receive alcohol cessation counseling. Furthermore, pt was found to have UTI secondary to proteus and received IV rocephin while inpatient and pt will be d/c on po cipro for 2 days more to complete the course. PT/OT saw the pt and recommended SNF.   Discharge Diagnoses:  Active Problems:   Hyponatremia   Protein-calorie malnutrition, severe   Pressure injury of  skin  Hyponatremia: likely chronic. Trending up slowly. Encourage oral hydration Will continue to monitor closely  Alcohol abuse:  Alcohol cessation counseling  Acute encephalopathy: ddx Wernicke's vs UTI vs dementia. Possible hx of dementia as per pt's caregiver.  CT brain showed no acute intracranial abnormalities. Ammonia level is WNL. Will continue on thiamine. Oriented to person and place  Metabolic acidosis: resolved  Normocytic anemia: no need for a transfusion at this time. Will continue to monitor   UTI: UA is positive, urine cx growing proteus mirabilis. Continue on IV rocephin while inpatient and transition to po cipro   Hypokalemia: WNL today. Will continue to monitor  Dysphagia: continue on dysphagia diet: Pureed diet  w/ Gravies; Thin liquids VIA CUP ONLY. No Straws. Pills given in Puree for safer swallowing. Aspiration precautions. Setup and support at Meals as per speech  Generalized weakness: PT/OT recs SNF   Prolonged QT interval: etiology unclear. Will continue on tele   GERD: continue on PPI   Discharge Instructions  Discharge Instructions    Diet general   Complete by: As directed    Dysphagia diet: Pureed diet (dysphagia level 1) w/ Gravies; Thin liquids VIA CUP ONLY. No Straws. Pills given in Puree for safer swallowing. Aspiration precautions. Setup and support at Meals. Ensure TID   Discharge instructions   Complete by: As directed    F/U PCP in 1 week   Increase activity slowly   Complete by: As directed    Increase activity slowly   Complete by: As directed      Allergies as of 11/15/2019   No  Known Allergies     Medication List    TAKE these medications   ciprofloxacin 500 MG tablet Commonly known as: Cipro Take 1 tablet (500 mg total) by mouth 2 (two) times daily for 4 days.   pantoprazole 40 MG tablet Commonly known as: PROTONIX Take 1 tablet (40 mg total) by mouth daily. Start taking on: November 16, 2019       No Known  Allergies  Consultations:  n/a   Procedures/Studies: CT Head Wo Contrast  Result Date: 11/11/2019 CLINICAL DATA:  Altered mental status. EXAM: CT HEAD WITHOUT CONTRAST TECHNIQUE: Contiguous axial images were obtained from the base of the skull through the vertex without intravenous contrast. COMPARISON:  08/09/2019 FINDINGS: Brain: No evidence of acute infarction, hemorrhage, hydrocephalus, extra-axial collection or mass lesion/mass effect. There is ventricular and sulcal enlargement reflecting mild to moderate atrophy. Old lacunar infarct lies in the anterior left basal ganglia. Bilateral periventricular white matter hypoattenuation is noted consistent with mild chronic microvascular ischemic change. Vascular: No hyperdense vessel or unexpected calcification. Skull: Normal. Negative for fracture or focal lesion. Sinuses/Orbits: Visualized globes and orbits are unremarkable. Mild frontal, ethmoid and right maxillary sinus mucosal thickening. Other: None. IMPRESSION: 1. No acute intracranial abnormalities. 2. Mild to moderate atrophy, old left basal ganglia lacunar infarct and mild chronic microvascular ischemic change. Electronically Signed   By: Amie Portland M.D.   On: 11/11/2019 12:01   DG Chest Portable 1 View  Result Date: 11/11/2019 CLINICAL DATA:  EMS called out the pt house with c/o pt being altered and weak this morning, normally uses a walker, pt has a hx of CVA but the care giver is unsure if the right face droop is new or not. Pt is incontinent of urine on arrival, EMS reports 3 empty beer cans on the table and the pt admits to drinking them the night before. EMS reports concerns for pt returning to the residence due to poor living conditions. EXAM: PORTABLE CHEST 1 VIEW COMPARISON:  08/11/2019 FINDINGS: Cardiac silhouette normal in size.  No mediastinal or hilar masses. Prominence of the bronchovascular markings, stable. Lungs otherwise clear. No pleural effusion or convincing  pneumothorax. Skeletal structures are grossly intact. IMPRESSION: No active disease. Electronically Signed   By: Amie Portland M.D.   On: 11/11/2019 12:46     Subjective: Pt c/o fatigue   Discharge Exam: Vitals:   11/14/19 2200 11/15/19 0459  BP:  (!) 162/80  Pulse:  90  Resp: (!) 23 20  Temp:  98.9 F (37.2 C)  SpO2:  100%   Vitals:   11/14/19 1158 11/14/19 2135 11/14/19 2200 11/15/19 0459  BP: 120/61 (!) 146/86  (!) 162/80  Pulse: 85 90  90  Resp: 18 18 (!) 23 20  Temp: 98 F (36.7 C) 98.8 F (37.1 C)  98.9 F (37.2 C)  TempSrc: Oral Oral  Oral  SpO2: 99% 97%  100%  Weight:      Height:        General: Pt is alert, awake, not in acute distress Cardiovascular: S1/S2 +, no rubs, no gallops Respiratory: CTA bilaterally, no wheezing, no rhonchi Abdominal: Soft, NT, ND, bowel sounds + Extremities:  no cyanosis    The results of significant diagnostics from this hospitalization (including imaging, microbiology, ancillary and laboratory) are listed below for reference.     Microbiology: Recent Results (from the past 240 hour(s))  Urine culture     Status: Abnormal   Collection Time: 11/11/19  3:47 PM  Specimen: Urine, Random  Result Value Ref Range Status   Specimen Description   Final    URINE, RANDOM Performed at [facility], [address] [place], [place] [address]    Special Requests   Final    NONE Performed at [facility], [address] [place], [place] [address]    Culture >=100,000 COLONIES/mL PROTEUS MIRABILIS (A)  Final   Report Status 11/13/2019 FINAL  Final   Organism ID, Bacteria PROTEUS MIRABILIS (A)  Final      Susceptibility   Proteus mirabilis - MIC*    AMPICILLIN <=2 SENSITIVE Sensitive     CEFAZOLIN <=4 SENSITIVE Sensitive     CEFTRIAXONE <=0.25 SENSITIVE Sensitive     CIPROFLOXACIN <=0.25 SENSITIVE Sensitive     GENTAMICIN <=1 SENSITIVE Sensitive     IMIPENEM 4 SENSITIVE Sensitive     NITROFURANTOIN 128  RESISTANT Resistant     TRIMETH/SULFA <=20 SENSITIVE Sensitive     AMPICILLIN/SULBACTAM <=2 SENSITIVE Sensitive     PIP/TAZO <=4 SENSITIVE Sensitive     * >=100,000 COLONIES/mL PROTEUS MIRABILIS  SARS CORONAVIRUS 2 (TAT 6-24 HRS) Nasopharyngeal Nasopharyngeal Swab     Status: None   Collection Time: 11/11/19  4:13 PM   Specimen: Nasopharyngeal Swab  Result Value Ref Range Status   SARS Coronavirus 2 NEGATIVE NEGATIVE Final    Comment: (NOTE) SARS-CoV-2 target nucleic acids are NOT DETECTED. The SARS-CoV-2 RNA is generally detectable in upper and lower respiratory specimens during the acute phase of infection. Negative results do not preclude SARS-CoV-2 infection, do not rule out co-infections with other pathogens, and should not be used as the sole basis for treatment or other patient management decisions. Negative results must be combined with clinical observations, patient history, and epidemiological information. The expected result is Negative. Fact Sheet for Patients: HairSlick.nohttps://www.fda.gov/media/138098/download Fact Sheet for Healthcare Providers: quierodirigir.comhttps://www.fda.gov/media/138095/download This test is not yet approved or cleared by the [place] FDA and  has been authorized for detection and/or diagnosis of SARS-CoV-2 by FDA under an Emergency Use Authorization (EUA). This EUA will remain  in effect (meaning this test can be used) for the duration of the COVID-19 declaration under Section 56 4(b)(1) of the Act, 21 U.S.C. section 360bbb-3(b)(1), unless the authorization is terminated or revoked sooner. Performed at [facility], [address] [place], [place] [address]      Labs: BNP (last 3 results) Recent Labs    08/12/19 1239 11/11/19 1251  BNP 105.0* 59.0   Basic Metabolic Panel: Recent Labs  Lab 11/11/19 1059 11/11/19 2146 11/12/19 0545 11/12/19 0545 11/12/19 1329 11/13/19 0452 11/13/19 1420 11/14/19 0521 11/15/19 0510  NA 120*   < > 123*   <  > 124* 127* 128* 130* 129*  K 3.9  --  3.4*  --   --  3.5  --  3.7 3.9  CL 86*  --  90*  --   --  94*  --  99 98  CO2 21*  --  21*  --   --  25  --  25 25  GLUCOSE 58*  --  70  --   --  116*  --  99 94  BUN 8  --  6*  --   --  8  --  9 11  CREATININE 0.52*  --  0.54*  --   --  0.44*  --  0.42* 0.43*  CALCIUM 9.2  --  8.7*  --   --  8.6*  --  8.6* 8.8*   < > = values in this interval not displayed.   Liver Function Tests: Recent Labs  Lab 11/11/19 1059 11/12/19 0545  AST 39 29  ALT 13 10  ALKPHOS 81 68  BILITOT 1.3* 1.3*  PROT 8.7* 7.0  ALBUMIN 3.5 2.9*   No results for input(s): LIPASE, AMYLASE in the last 168 hours. Recent Labs  Lab 11/11/19 1251  AMMONIA 19   CBC: Recent Labs  Lab 11/11/19 1059 11/12/19 0545 11/13/19 0452 11/14/19 0521 11/15/19 0510  WBC 5.1 4.9 4.8 4.4 4.3  HGB 11.8* 9.9* 9.3* 8.9* 8.7*  HCT 34.6* 29.7* 26.8* 26.1* 26.2*  MCV 92.8 92.5 90.8 90.9 93.9  PLT 233 237 253 254 294   Cardiac Enzymes: No results for input(s): CKTOTAL, CKMB, CKMBINDEX, TROPONINI in the last 168 hours. BNP: Invalid input(s): POCBNP CBG: Recent Labs  Lab 11/11/19 1312 11/11/19 1426  GLUCAP 59* 102*   D-Dimer No results for input(s): DDIMER in the last 72 hours. Hgb A1c No results for input(s): HGBA1C in the last 72 hours. Lipid Profile No results for input(s): CHOL, HDL, LDLCALC, TRIG, CHOLHDL, LDLDIRECT in the last 72 hours. Thyroid function studies No results for input(s): TSH, T4TOTAL, T3FREE, THYROIDAB in the last 72 hours.  Invalid input(s): FREET3 Anemia work up No results for input(s): VITAMINB12, FOLATE, FERRITIN, TIBC, IRON, RETICCTPCT in the last 72 hours. Urinalysis    Component Value Date/Time   COLORURINE YELLOW 11/11/2019 1251   APPEARANCEUR CLOUDY (A) 11/11/2019 1251   LABSPEC 1.020 11/11/2019 1251   PHURINE 8.5 (H) 11/11/2019 1251   GLUCOSEU NEGATIVE 11/11/2019 1251   HGBUR LARGE (A) 11/11/2019 1251   BILIRUBINUR SMALL (A) 11/11/2019  1251   KETONESUR 80 (A) 11/11/2019 1251   PROTEINUR 100 (A) 11/11/2019 1251   UROBILINOGEN 0.2 02/18/2014 1720   NITRITE POSITIVE (A) 11/11/2019 1251   LEUKOCYTESUR LARGE (A) 11/11/2019 1251   Sepsis Labs Invalid input(s): PROCALCITONIN,  WBC,  LACTICIDVEN Microbiology Recent Results (from the past 240 hour(s))  Urine culture     Status: Abnormal   Collection Time: 11/11/19  3:47 PM   Specimen: Urine, Random  Result Value Ref Range Status   Specimen Description   Final    URINE, RANDOM Performed at [facility], [address], [place], [place] [address]    Special Requests   Final    NONE Performed at [facility], [address] [place], [place] [address]    Culture >=100,000 COLONIES/mL PROTEUS MIRABILIS (A)  Final   Report Status 11/13/2019 FINAL  Final   Organism ID, Bacteria PROTEUS MIRABILIS (A)  Final      Susceptibility   Proteus mirabilis - MIC*    AMPICILLIN <=2 SENSITIVE Sensitive     CEFAZOLIN <=4 SENSITIVE Sensitive     CEFTRIAXONE <=0.25 SENSITIVE Sensitive     CIPROFLOXACIN <=0.25 SENSITIVE Sensitive     GENTAMICIN <=1 SENSITIVE Sensitive     IMIPENEM 4 SENSITIVE Sensitive     NITROFURANTOIN 128 RESISTANT Resistant     TRIMETH/SULFA <=20 SENSITIVE Sensitive     AMPICILLIN/SULBACTAM <=2 SENSITIVE Sensitive     PIP/TAZO <=4 SENSITIVE Sensitive     * >=100,000 COLONIES/mL PROTEUS MIRABILIS  SARS CORONAVIRUS 2 (TAT 6-24 HRS) Nasopharyngeal Nasopharyngeal Swab     Status: None   Collection Time: 11/11/19  4:13 PM   Specimen: Nasopharyngeal Swab  Result Value Ref Range Status   SARS Coronavirus 2 NEGATIVE NEGATIVE Final    Comment: (NOTE) SARS-CoV-2 target  nucleic acids are NOT DETECTED. The SARS-CoV-2 RNA is generally detectable in upper and lower respiratory specimens during the acute phase of infection. Negative results do not preclude SARS-CoV-2 infection, do not rule out co-infections with other pathogens, and should not be used as  the sole basis for treatment or other patient management decisions. Negative results must be combined with clinical observations, patient history, and epidemiological information. The expected result is Negative. Fact Sheet for Patients: SugarRoll.be Fact Sheet for Healthcare Providers: https://www.woods-mathews.com/ This test is not yet approved or cleared by the [place] FDA and  has been authorized for detection and/or diagnosis of SARS-CoV-2 by FDA under an Emergency Use Authorization (EUA). This EUA will remain  in effect (meaning this test can be used) for the duration of the COVID-19 declaration under Section 56 4(b)(1) of the Act, 21 U.S.C. section 360bbb-3(b)(1), unless the authorization is terminated or revoked sooner. Performed at [facility], [place] [address], [place], [place] [address]      Time coordinating discharge: Over 30 minutes  SIGNED:   Wyvonnia Dusky, MD  [facility] 11/15/2019, 11:05 AM Pager   If 7PM-7AM, please contact night-coverage [email or web address]

## 2019-11-15 NOTE — TOC Transition Note (Signed)
Transition of Care [facility]) - CM/SW Discharge Note   Patient Details  Name: Javier Robinson MRN: [id number] Date of Birth: [birth year]  [facility] CM/SW Contact:  Chapman Fitch, RN Phone Number: 11/15/2019, 12:01 PM   Clinical Narrative:     To discharge to [facility] today.  Family notified  EMS packet on chart EMS transport arranged  Discharge information send in the hub and faxed to [facility] per Tammy's request  Final next level of care: Skilled Nursing Facility Barriers to Discharge: No Barriers Identified   Patient Goals and CMS Choice        Discharge Placement              Patient chooses bed at: [facility] [place] Patient to be transferred to facility by: EMS Name of family member notified: Niece and Caregiver Patient and family notified of of transfer: 11/15/19  Discharge Plan and Services   Discharge Planning Services: CM Consult                                 Social Determinants of Health (SDOH) Interventions     Readmission Risk Interventions No flowsheet data found.

## 2019-12-06 ENCOUNTER — Inpatient Hospital Stay
Admission: EM | Admit: 2019-12-06 | Discharge: 2019-12-16 | DRG: 640 | Disposition: A | Discharge status: Skilled Nursing Facility | Payer: Medicare Other | Attending: Internal Medicine | Admitting: Internal Medicine

## 2019-12-06 ENCOUNTER — Other Ambulatory Visit: Payer: Self-pay

## 2019-12-06 ENCOUNTER — Emergency Department: Payer: Medicare Other

## 2019-12-06 ENCOUNTER — Inpatient Hospital Stay: Payer: Medicare Other

## 2019-12-06 ENCOUNTER — Encounter: Payer: Self-pay | Admitting: Emergency Medicine

## 2019-12-06 DIAGNOSIS — F17210 Nicotine dependence, cigarettes, uncomplicated: Secondary | ICD-10-CM | POA: Diagnosis present

## 2019-12-06 DIAGNOSIS — F0390 Unspecified dementia without behavioral disturbance: Secondary | ICD-10-CM | POA: Diagnosis present

## 2019-12-06 DIAGNOSIS — W19XXXA Unspecified fall, initial encounter: Secondary | ICD-10-CM | POA: Diagnosis present

## 2019-12-06 DIAGNOSIS — R9431 Abnormal electrocardiogram [ECG] [EKG]: Secondary | ICD-10-CM | POA: Diagnosis present

## 2019-12-06 DIAGNOSIS — G40909 Epilepsy, unspecified, not intractable, without status epilepticus: Secondary | ICD-10-CM | POA: Diagnosis present

## 2019-12-06 DIAGNOSIS — F1020 Alcohol dependence, uncomplicated: Secondary | ICD-10-CM | POA: Diagnosis present

## 2019-12-06 DIAGNOSIS — R627 Adult failure to thrive: Secondary | ICD-10-CM | POA: Diagnosis present

## 2019-12-06 DIAGNOSIS — Z20822 Contact with and (suspected) exposure to covid-19: Secondary | ICD-10-CM | POA: Diagnosis present

## 2019-12-06 DIAGNOSIS — E162 Hypoglycemia, unspecified: Secondary | ICD-10-CM | POA: Diagnosis present

## 2019-12-06 DIAGNOSIS — N401 Enlarged prostate with lower urinary tract symptoms: Secondary | ICD-10-CM | POA: Diagnosis present

## 2019-12-06 DIAGNOSIS — Z6820 Body mass index (BMI) 20.0-20.9, adult: Secondary | ICD-10-CM | POA: Diagnosis not present

## 2019-12-06 DIAGNOSIS — N3289 Other specified disorders of bladder: Secondary | ICD-10-CM | POA: Diagnosis present

## 2019-12-06 DIAGNOSIS — E871 Hypo-osmolality and hyponatremia: Principal | ICD-10-CM | POA: Diagnosis present

## 2019-12-06 DIAGNOSIS — I160 Hypertensive urgency: Secondary | ICD-10-CM | POA: Diagnosis not present

## 2019-12-06 DIAGNOSIS — T17908A Unspecified foreign body in respiratory tract, part unspecified causing other injury, initial encounter: Secondary | ICD-10-CM

## 2019-12-06 DIAGNOSIS — K219 Gastro-esophageal reflux disease without esophagitis: Secondary | ICD-10-CM | POA: Diagnosis present

## 2019-12-06 DIAGNOSIS — Z96651 Presence of right artificial knee joint: Secondary | ICD-10-CM | POA: Diagnosis present

## 2019-12-06 DIAGNOSIS — R140 Abdominal distension (gaseous): Secondary | ICD-10-CM

## 2019-12-06 DIAGNOSIS — E876 Hypokalemia: Secondary | ICD-10-CM | POA: Diagnosis present

## 2019-12-06 DIAGNOSIS — R1313 Dysphagia, pharyngeal phase: Secondary | ICD-10-CM | POA: Diagnosis present

## 2019-12-06 DIAGNOSIS — R338 Other retention of urine: Secondary | ICD-10-CM | POA: Diagnosis present

## 2019-12-06 DIAGNOSIS — F10288 Alcohol dependence with other alcohol-induced disorder: Secondary | ICD-10-CM | POA: Diagnosis not present

## 2019-12-06 DIAGNOSIS — I4581 Long QT syndrome: Secondary | ICD-10-CM | POA: Diagnosis not present

## 2019-12-06 DIAGNOSIS — G9341 Metabolic encephalopathy: Secondary | ICD-10-CM | POA: Diagnosis present

## 2019-12-06 DIAGNOSIS — I10 Essential (primary) hypertension: Secondary | ICD-10-CM | POA: Diagnosis present

## 2019-12-06 DIAGNOSIS — E440 Moderate protein-calorie malnutrition: Secondary | ICD-10-CM | POA: Diagnosis present

## 2019-12-06 DIAGNOSIS — F1010 Alcohol abuse, uncomplicated: Secondary | ICD-10-CM | POA: Diagnosis not present

## 2019-12-06 DIAGNOSIS — Z79899 Other long term (current) drug therapy: Secondary | ICD-10-CM

## 2019-12-06 DIAGNOSIS — D638 Anemia in other chronic diseases classified elsewhere: Secondary | ICD-10-CM | POA: Diagnosis present

## 2019-12-06 DIAGNOSIS — G9340 Encephalopathy, unspecified: Secondary | ICD-10-CM

## 2019-12-06 DIAGNOSIS — F1029 Alcohol dependence with unspecified alcohol-induced disorder: Secondary | ICD-10-CM | POA: Diagnosis not present

## 2019-12-06 DIAGNOSIS — Z9189 Other specified personal risk factors, not elsewhere classified: Secondary | ICD-10-CM

## 2019-12-06 LAB — CBC WITH DIFFERENTIAL/PLATELET
Abs Immature Granulocytes: 0.01 10*3/uL (ref 0.00–0.07)
Basophils Absolute: 0.1 10*3/uL (ref 0.0–0.1)
Basophils Relative: 1 %
Eosinophils Absolute: 0.1 10*3/uL (ref 0.0–0.5)
Eosinophils Relative: 2 %
HCT: 27.8 % — ABNORMAL LOW (ref 39.0–52.0)
Hemoglobin: 9.3 g/dL — ABNORMAL LOW (ref 13.0–17.0)
Immature Granulocytes: 0 %
Lymphocytes Relative: 61 %
Lymphs Abs: 2.6 10*3/uL (ref 0.7–4.0)
MCH: 30.3 pg (ref 26.0–34.0)
MCHC: 33.5 g/dL (ref 30.0–36.0)
MCV: 90.6 fL (ref 80.0–100.0)
Monocytes Absolute: 0.5 10*3/uL (ref 0.1–1.0)
Monocytes Relative: 11 %
Neutro Abs: 1.1 10*3/uL — ABNORMAL LOW (ref 1.7–7.7)
Neutrophils Relative %: 25 %
Platelets: 276 10*3/uL (ref 150–400)
RBC: 3.07 MIL/uL — ABNORMAL LOW (ref 4.22–5.81)
RDW: 16.7 % — ABNORMAL HIGH (ref 11.5–15.5)
WBC: 4.2 10*3/uL (ref 4.0–10.5)
nRBC: 0 % (ref 0.0–0.2)

## 2019-12-06 LAB — BASIC METABOLIC PANEL
Anion gap: 7 (ref 5–15)
BUN: 7 mg/dL — ABNORMAL LOW (ref 8–23)
CO2: 23 mmol/L (ref 22–32)
Calcium: 8.3 mg/dL — ABNORMAL LOW (ref 8.9–10.3)
Chloride: 91 mmol/L — ABNORMAL LOW (ref 98–111)
Creatinine, Ser: 0.35 mg/dL — ABNORMAL LOW (ref 0.61–1.24)
GFR calc Af Amer: >60 mL/min (ref >60)
GFR calc non Af Amer: >60 mL/min (ref >60)
Glucose, Bld: 137 mg/dL — ABNORMAL HIGH (ref 70–99)
Potassium: 3.5 mmol/L (ref 3.5–5.1)
Sodium: 121 mmol/L — ABNORMAL LOW (ref 135–145)

## 2019-12-06 LAB — COMPREHENSIVE METABOLIC PANEL
ALT: 9 U/L (ref 0–44)
AST: 22 U/L (ref 15–41)
Albumin: 3.3 g/dL — ABNORMAL LOW (ref 3.5–5.0)
Alkaline Phosphatase: 55 U/L (ref 38–126)
Anion gap: 10 (ref 5–15)
BUN: 9 mg/dL (ref 8–23)
CO2: 21 mmol/L — ABNORMAL LOW (ref 22–32)
Calcium: 8.3 mg/dL — ABNORMAL LOW (ref 8.9–10.3)
Chloride: 88 mmol/L — ABNORMAL LOW (ref 98–111)
Creatinine, Ser: 0.46 mg/dL — ABNORMAL LOW (ref 0.61–1.24)
GFR calc Af Amer: >60 mL/min (ref >60)
GFR calc non Af Amer: >60 mL/min (ref >60)
Glucose, Bld: 190 mg/dL — ABNORMAL HIGH (ref 70–99)
Potassium: 3.5 mmol/L (ref 3.5–5.1)
Sodium: 119 mmol/L — CL (ref 135–145)
Total Bilirubin: 0.3 mg/dL (ref 0.3–1.2)
Total Protein: 7.1 g/dL (ref 6.5–8.1)

## 2019-12-06 LAB — GLUCOSE, CAPILLARY
Glucose-Capillary: 195 mg/dL — ABNORMAL HIGH (ref 70–99)
Glucose-Capillary: 64 mg/dL — ABNORMAL LOW (ref 70–99)
Glucose-Capillary: 52 mg/dL — ABNORMAL LOW (ref 70–99)
Glucose-Capillary: 134 mg/dL — ABNORMAL HIGH (ref 70–99)
Glucose-Capillary: 100 mg/dL — ABNORMAL HIGH (ref 70–99)

## 2019-12-06 LAB — CBC
HCT: 28.2 % — ABNORMAL LOW (ref 39.0–52.0)
Hemoglobin: 9.6 g/dL — ABNORMAL LOW (ref 13.0–17.0)
MCH: 30.4 pg (ref 26.0–34.0)
MCHC: 34 g/dL (ref 30.0–36.0)
MCV: 89.2 fL (ref 80.0–100.0)
Platelets: 293 10*3/uL (ref 150–400)
RBC: 3.16 MIL/uL — ABNORMAL LOW (ref 4.22–5.81)
RDW: 16.1 % — ABNORMAL HIGH (ref 11.5–15.5)
WBC: 4.1 10*3/uL (ref 4.0–10.5)
nRBC: 0 % (ref 0.0–0.2)

## 2019-12-06 LAB — COMPREHENSIVE METABOLIC PANEL WITH GFR
ALT: 10 U/L (ref 0–44)
AST: 21 U/L (ref 15–41)
Albumin: 3.3 g/dL — ABNORMAL LOW (ref 3.5–5.0)
Alkaline Phosphatase: 59 U/L (ref 38–126)
Anion gap: 10 (ref 5–15)
BUN: 8 mg/dL (ref 8–23)
CO2: 23 mmol/L (ref 22–32)
Calcium: 8.7 mg/dL — ABNORMAL LOW (ref 8.9–10.3)
Chloride: 87 mmol/L — ABNORMAL LOW (ref 98–111)
Creatinine, Ser: 0.36 mg/dL — ABNORMAL LOW (ref 0.61–1.24)
GFR calc Af Amer: >60 mL/min
GFR calc non Af Amer: >60 mL/min
Glucose, Bld: 56 mg/dL — ABNORMAL LOW (ref 70–99)
Potassium: 3.6 mmol/L (ref 3.5–5.1)
Sodium: 120 mmol/L — ABNORMAL LOW (ref 135–145)
Total Bilirubin: 0.4 mg/dL (ref 0.3–1.2)
Total Protein: 7.5 g/dL (ref 6.5–8.1)

## 2019-12-06 LAB — URINALYSIS, COMPLETE (UACMP) WITH MICROSCOPIC
Bilirubin Urine: NEGATIVE
Glucose, UA: 150 mg/dL — AB
Hgb urine dipstick: NEGATIVE
Ketones, ur: NEGATIVE mg/dL
Leukocytes,Ua: NEGATIVE
Nitrite: NEGATIVE
Protein, ur: NEGATIVE mg/dL
Specific Gravity, Urine: 1.011 (ref 1.005–1.030)
pH: 6 (ref 5.0–8.0)

## 2019-12-06 LAB — ETHANOL: Alcohol, Ethyl (B): 79 mg/dL — ABNORMAL HIGH (ref <10)

## 2019-12-06 LAB — MAGNESIUM
Magnesium: 1.6 mg/dL — ABNORMAL LOW (ref 1.7–2.4)
Magnesium: 2.4 mg/dL (ref 1.7–2.4)

## 2019-12-06 LAB — PHOSPHORUS: Phosphorus: 3.6 mg/dL (ref 2.5–4.6)

## 2019-12-06 LAB — AMMONIA: Ammonia: 14 umol/L (ref 9–35)

## 2019-12-06 MED ORDER — LACTATED RINGERS IV BOLUS
1000.0000 mL | Freq: Once | INTRAVENOUS | Status: AC
  Administered 2019-12-06: 1000 mL via INTRAVENOUS

## 2019-12-06 MED ORDER — ACETAMINOPHEN 325 MG PO TABS
650.0000 mg | ORAL_TABLET | Freq: Four times a day (QID) | ORAL | Status: DC | PRN
  Administered 2019-12-08: 04:00:00 650 mg via ORAL
  Filled 2019-12-06: qty 2

## 2019-12-06 MED ORDER — THIAMINE HCL 100 MG PO TABS
100.0000 mg | ORAL_TABLET | Freq: Every day | ORAL | Status: DC
  Administered 2019-12-06 – 2019-12-16 (×9): 100 mg via ORAL
  Filled 2019-12-06 (×9): qty 1

## 2019-12-06 MED ORDER — LORAZEPAM 2 MG/ML IJ SOLN: 1.0000 mg | INTRAMUSCULAR | Status: AC | PRN

## 2019-12-06 MED ORDER — LORAZEPAM 1 MG PO TABS
1.0000 mg | ORAL_TABLET | ORAL | Status: AC | PRN
  Administered 2019-12-08: 1 mg via ORAL
  Filled 2019-12-06: qty 1

## 2019-12-06 MED ORDER — DEXTROSE-NACL 5-0.9 % IV SOLN: INTRAVENOUS | Status: DC

## 2019-12-06 MED ORDER — POTASSIUM CHLORIDE CRYS ER 20 MEQ PO TBCR
40.0000 meq | EXTENDED_RELEASE_TABLET | Freq: Once | ORAL | Status: AC
  Administered 2019-12-06: 19:00:00 40 meq via ORAL
  Filled 2019-12-06: qty 2

## 2019-12-06 MED ORDER — HYDRALAZINE HCL 20 MG/ML IJ SOLN
10.0000 mg | Freq: Four times a day (QID) | INTRAMUSCULAR | Status: DC | PRN
  Administered 2019-12-06 – 2019-12-08 (×4): 10 mg via INTRAVENOUS
  Filled 2019-12-06 (×4): qty 1

## 2019-12-06 MED ORDER — ACETAMINOPHEN 650 MG RE SUPP: 650.0000 mg | Freq: Four times a day (QID) | RECTAL | Status: DC | PRN

## 2019-12-06 MED ORDER — MAGNESIUM SULFATE 2 GM/50ML IV SOLN
2.0000 g | Freq: Once | INTRAVENOUS | Status: AC
  Administered 2019-12-06: 2 g via INTRAVENOUS
  Filled 2019-12-06: qty 50

## 2019-12-06 MED ORDER — ADULT MULTIVITAMIN W/MINERALS CH
1.0000 | ORAL_TABLET | Freq: Every day | ORAL | Status: DC
  Administered 2019-12-06 – 2019-12-16 (×10): 1 via ORAL
  Filled 2019-12-06 (×10): qty 1

## 2019-12-06 MED ORDER — THIAMINE HCL 100 MG/ML IJ SOLN
100.0000 mg | Freq: Every day | INTRAMUSCULAR | Status: DC
  Administered 2019-12-10: 12:00:00 100 mg via INTRAVENOUS
  Filled 2019-12-06 (×2): qty 2

## 2019-12-06 MED ORDER — DEXTROSE 50 % IV SOLN
25.0000 mL | Freq: Once | INTRAVENOUS | Status: AC
  Administered 2019-12-06: 25 mL via INTRAVENOUS
  Filled 2019-12-06: qty 50

## 2019-12-06 MED ORDER — FOLIC ACID 1 MG PO TABS
1.0000 mg | ORAL_TABLET | Freq: Every day | ORAL | Status: DC
  Administered 2019-12-06 – 2019-12-16 (×10): 1 mg via ORAL
  Filled 2019-12-06 (×10): qty 1
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
## 2019-12-06 NOTE — ED Notes (Addendum)
Pt is resting at this time. ED stretcher locked at lowest position. Denies any complaints at this time. Connected to cardiac monitor and V/S WNL. Respiration equal and unlabored at this time. Call light within reach

## 2019-12-06 NOTE — ED Triage Notes (Signed)
Pt via EMS from home. EMS was called for weakness and mild confusion. Fire arrived on the scene and obtained a CBG of 20. Gave D10 and increased it to 48 and they gave another D10 and it increased to 240. hung by EMS. On arrival, CBG 195 and pt is A&Ox4 and NAD. Pt has no hx of diabetes.

## 2019-12-06 NOTE — ED Notes (Signed)
Pt given a total of 16oz of OJ and sandwich tray.

## 2019-12-06 NOTE — ED Notes (Signed)
Ready bed @ 1746, patient going to room ICU 8

## 2019-12-06 NOTE — ED Notes (Signed)
Attempted to call report on this pt. Charge nurse in the ICU states she did not have a nurse for this pt. Raquel, Consulting civil engineer made aware.

## 2019-12-06 NOTE — ED Notes (Signed)
Pt transported to CT at this time.

## 2019-12-06 NOTE — ED Notes (Signed)
Pt soiled of urine. Pt bed linen, brief, pants, and pad changed at this time. External urinary cath placed on patient at this time.
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
## 2019-12-06 NOTE — ED Notes (Signed)
Repeat CBG 52. Dr. Gonzella Lex, MD [facility] aware at this time.

## 2019-12-06 NOTE — ED Notes (Signed)
Date and time results received: 12/06/19 1522  Test: Sodium Critical Value: 119  Name of Provider Notified: Dr. Larinda Buttery, MD

## 2019-12-07 ENCOUNTER — Other Ambulatory Visit: Payer: Self-pay

## 2019-12-07 ENCOUNTER — Inpatient Hospital Stay: Payer: Medicare Other

## 2019-12-07 DIAGNOSIS — R140 Abdominal distension (gaseous): Secondary | ICD-10-CM

## 2019-12-07 DIAGNOSIS — F1029 Alcohol dependence with unspecified alcohol-induced disorder: Secondary | ICD-10-CM

## 2019-12-07 LAB — BASIC METABOLIC PANEL
Anion gap: 5 (ref 5–15)
Anion gap: 7 (ref 5–15)
Anion gap: 7 (ref 5–15)
Anion gap: 8 (ref 5–15)
BUN: 6 mg/dL — ABNORMAL LOW (ref 8–23)
BUN: 6 mg/dL — ABNORMAL LOW (ref 8–23)
BUN: 7 mg/dL — ABNORMAL LOW (ref 8–23)
BUN: 8 mg/dL (ref 8–23)
CO2: 21 mmol/L — ABNORMAL LOW (ref 22–32)
CO2: 22 mmol/L (ref 22–32)
CO2: 22 mmol/L (ref 22–32)
CO2: 22 mmol/L (ref 22–32)
Calcium: 8.3 mg/dL — ABNORMAL LOW (ref 8.9–10.3)
Calcium: 8.4 mg/dL — ABNORMAL LOW (ref 8.9–10.3)
Calcium: 8.6 mg/dL — ABNORMAL LOW (ref 8.9–10.3)
Calcium: 8.9 mg/dL (ref 8.9–10.3)
Chloride: 91 mmol/L — ABNORMAL LOW (ref 98–111)
Chloride: 92 mmol/L — ABNORMAL LOW (ref 98–111)
Chloride: 92 mmol/L — ABNORMAL LOW (ref 98–111)
Chloride: 95 mmol/L — ABNORMAL LOW (ref 98–111)
Creatinine, Ser: 0.32 mg/dL — ABNORMAL LOW (ref 0.61–1.24)
Creatinine, Ser: 0.41 mg/dL — ABNORMAL LOW (ref 0.61–1.24)
Creatinine, Ser: 0.44 mg/dL — ABNORMAL LOW (ref 0.61–1.24)
Creatinine, Ser: 0.46 mg/dL — ABNORMAL LOW (ref 0.61–1.24)
GFR calc Af Amer: >60 mL/min (ref >60)
GFR calc Af Amer: >60 mL/min (ref >60)
GFR calc Af Amer: >60 mL/min (ref >60)
GFR calc Af Amer: >60 mL/min (ref >60)
GFR calc non Af Amer: >60 mL/min (ref >60)
GFR calc non Af Amer: >60 mL/min (ref >60)
GFR calc non Af Amer: >60 mL/min (ref >60)
GFR calc non Af Amer: >60 mL/min (ref >60)
Glucose, Bld: 102 mg/dL — ABNORMAL HIGH (ref 70–99)
Glucose, Bld: 107 mg/dL — ABNORMAL HIGH (ref 70–99)
Glucose, Bld: 125 mg/dL — ABNORMAL HIGH (ref 70–99)
Glucose, Bld: 157 mg/dL — ABNORMAL HIGH (ref 70–99)
Potassium: 3.6 mmol/L (ref 3.5–5.1)
Potassium: 4 mmol/L (ref 3.5–5.1)
Potassium: 4.2 mmol/L (ref 3.5–5.1)
Potassium: 4.5 mmol/L (ref 3.5–5.1)
Sodium: 120 mmol/L — ABNORMAL LOW (ref 135–145)
Sodium: 121 mmol/L — ABNORMAL LOW (ref 135–145)
Sodium: 121 mmol/L — ABNORMAL LOW (ref 135–145)
Sodium: 122 mmol/L — ABNORMAL LOW (ref 135–145)

## 2019-12-07 LAB — CBC
HCT: 27.1 % — ABNORMAL LOW (ref 39.0–52.0)
Hemoglobin: 9.7 g/dL — ABNORMAL LOW (ref 13.0–17.0)
MCH: 30.5 pg (ref 26.0–34.0)
MCHC: 35.8 g/dL (ref 30.0–36.0)
MCV: 85.2 fL (ref 80.0–100.0)
Platelets: 266 10*3/uL (ref 150–400)
RBC: 3.18 MIL/uL — ABNORMAL LOW (ref 4.22–5.81)
RDW: 16 % — ABNORMAL HIGH (ref 11.5–15.5)
WBC: 5.1 10*3/uL (ref 4.0–10.5)
nRBC: 0 % (ref 0.0–0.2)

## 2019-12-07 LAB — SARS CORONAVIRUS 2 (TAT 6-24 HRS): SARS Coronavirus 2: NEGATIVE

## 2019-12-07 LAB — GLUCOSE, CAPILLARY
Glucose-Capillary: 126 mg/dL — ABNORMAL HIGH (ref 70–99)
Glucose-Capillary: 67 mg/dL — ABNORMAL LOW (ref 70–99)
Glucose-Capillary: 78 mg/dL (ref 70–99)
Glucose-Capillary: 79 mg/dL (ref 70–99)
Glucose-Capillary: 83 mg/dL (ref 70–99)
Glucose-Capillary: 95 mg/dL (ref 70–99)

## 2019-12-07 LAB — CORTISOL-AM, BLOOD: Cortisol - AM: 11.6 ug/dL (ref 6.7–22.6)

## 2019-12-07 LAB — MAGNESIUM: Magnesium: 1.7 mg/dL (ref 1.7–2.4)

## 2019-12-07 MED ORDER — TAMSULOSIN HCL 0.4 MG PO CAPS
0.4000 mg | ORAL_CAPSULE | Freq: Every day | ORAL | Status: DC
  Administered 2019-12-09 – 2019-12-15 (×7): 0.4 mg via ORAL
  Filled 2019-12-07 (×7): qty 1

## 2019-12-07 MED ORDER — AMLODIPINE BESYLATE 5 MG PO TABS
5.0000 mg | ORAL_TABLET | Freq: Every day | ORAL | Status: DC
  Administered 2019-12-08 – 2019-12-16 (×9): 5 mg via ORAL
  Filled 2019-12-07 (×9): qty 1

## 2019-12-07 MED ORDER — MAGNESIUM SULFATE 2 GM/50ML IV SOLN
2.0000 g | Freq: Once | INTRAVENOUS | Status: AC
  Administered 2019-12-07: 2 g via INTRAVENOUS
  Filled 2019-12-07: qty 50

## 2019-12-07 MED ORDER — ENOXAPARIN SODIUM 40 MG/0.4ML ~~LOC~~ SOLN
40.0000 mg | SUBCUTANEOUS | Status: DC
  Administered 2019-12-08 – 2019-12-16 (×9): 40 mg via SUBCUTANEOUS
  Filled 2019-12-07 (×9): qty 0.4

## 2019-12-07 MED ORDER — PANTOPRAZOLE SODIUM 40 MG PO TBEC
40.0000 mg | DELAYED_RELEASE_TABLET | Freq: Every day | ORAL | Status: DC
  Administered 2019-12-08 – 2019-12-16 (×9): 40 mg via ORAL
  Filled 2019-12-07 (×10): qty 1

## 2019-12-07 NOTE — ED Notes (Signed)
Max of 376cc noted upon bladder scan. 200cc of amber urine noted in suction canister. Pt's abdomen feels taut. Pt states this is his baseline.

## 2019-12-07 NOTE — ED Notes (Signed)
BG 126. Report given to floor RN Trinna Post.

## 2019-12-07 NOTE — ED Notes (Signed)
Per pharmacy, magnesium sulfate IV can be given along with 5% dextrose and 0.9% NS

## 2019-12-07 NOTE — ED Notes (Signed)
Pt dropped tray in room, unknown intake, patient denied wanting another tray.  Assisted to clean up and reposition in bed.  RN Shanda Bumps aware.

## 2019-12-07 NOTE — ED Notes (Signed)
Pt found to have "small adult" cuff on arm and had been tensing up arm and bending it during last few BP readings. Cuff switched to regular adult size and pt prompted to relax arm while BP being taken.

## 2019-12-07 NOTE — Progress Notes (Addendum)
PROGRESS NOTE    Javier Robinson  TKW:[id number] DOB: 1946-08-08 DOA: 12/06/2019 [facility]CP: [facility]   Chief Complaint  Patient presents with  . Hypoglycemia    Brief Narrative:  74 y.o. male, with history of heavy alcohol use with chronic hyponatremia, dysphagia,?  Dementia, GERD, uncontrolled hypertension history of CHF was brought to the ED for hypoglycemia.  Patient was confused on presentation and for EMS he was poorly alert throughout the day and when EMS arrived his CBG was in the 20s.  Given D10 initially then given another round of D10 for ongoing hypoglycemia. Admitted for unexplained hypoglycemia and hyponatremia.   Assessment & Plan:   Principal Problem:   Hypoglycemia Suspect due to poor p.o. intake.  A.m. cortisol normal.  Saline.  CBG currently stable.  Monitor for hypoglycemic episodes.  Active Problems: Acute on chronic hyponatremia Persistent with ongoing alcohol use.  Getting gentle hydration with goal of sodium correction 10-12 meq/ 24 hrs. No improvement since admission.  Monitor on telemetry.  Will consult nephrology.  (ED had called renal upon admission and recommended to continue current rate of D5 and NS).   Hypertensive urgency Not on any meds.  On as needed hydralazine and stable.  Hypokalemia/hypomagnesemia Replenished.  Prolonged QT interval Low K and magnesium replenished.  Still has prolonged QTC on follow-up EKG this morning.  Monitor on telemetry.  Alcohol abuse No signs of withdrawal.  Monitor on CIWA.  Counseled on cessation.  Continue thiamine, folate and multivitamin.  Failure to thrive PT consult.  Will benefit from SNF.  Tense abdomen No ascites on ultrasound abdomen.  Shows heterogeneous echotexture of liver parenchyma suggestive of possible diffuse liver disease. Bladder distention noted which is likely contributing to the tense abdomen.  Required intermittent in and out catheter.  Will place on  Flomax.     DVT prophylaxis: Subcu Lovenox Code Status: Full code Family Communication: None Disposition: SNF  Status is: Inpatient  Remains inpatient appropriate because:Persistent severe electrolyte disturbances   Dispo: The patient is from: Home              Anticipated d/c is to: SNF              Anticipated d/c date is: 3 days              Patient currently is not medically stable to d/c.        Consultants:   None  Procedures:   Head CT, ultrasound abdomen   Antimicrobials:    Subjective: Seen and examined.  Appears more alert and oriented.  Denies abdominal pain  Objective: Vitals:   12/07/19 1215 12/07/19 1220 12/07/19 1230 12/07/19 1326  BP:  (!) 149/75 (!) 143/84 (!) 154/74  Pulse: (!) 106  (!) 111 100  Resp: 17  (!) 21 16  Temp:    98.6 F (37 C)  TempSrc:    Oral  SpO2: 98%  100% 96%  Weight:    71.7 kg  Height:    5\' 8"  (1.727 m)   No intake or output data in the 24 hours ending 12/07/19 1745 Filed Weights   12/06/19 1432 12/07/19 1326  Weight: 65 kg 71.7 kg    Examination:  General: Elderly male lying in bed, fatigued, not in distress HEENT: Pallor present, no icterus, moist mucosa, supple neck Chest: Clear bilaterally CVs: Normal S1-S2, no murmurs GI: Tense abdomen, nondistended, bowel sounds present, nontender Musculoskeletal: Warm, no edema CNS: Alert and oriented, no  tremors   Data Reviewed: I have personally reviewed following labs and imaging studies  CBC: Recent Labs  Lab 12/06/19 1438 12/06/19 1659 12/07/19 0559  WBC 4.2 4.1 5.1  NEUTROABS 1.1*  --   --   HGB 9.3* 9.6* 9.7*  HCT 27.8* 28.2* 27.1*  MCV 90.6 89.2 85.2  PLT 276 293 266    Basic Metabolic Panel: Recent Labs  Lab 12/06/19 1438 12/06/19 1438 12/06/19 1659 12/06/19 2050 12/07/19 0032 12/07/19 0559 12/07/19 1329  NA 119*   < > 120* 121* 121* 122* 120*  K 3.5   < > 3.6 3.5 3.6 4.0 4.2  CL 88*   < > 87* 91* 91* 95* 92*  CO2 21*   < > 23 23  22 22  21*  GLUCOSE 190*   < > 56* 137* 125* 157* 107*  BUN 9   < > 8 7* 8 7* 6*  CREATININE 0.46*   < > 0.36* 0.35* 0.41* 0.32* 0.46*  CALCIUM 8.3*   < > 8.7* 8.3* 8.4* 8.3* 8.6*  MG 1.6*  --  2.4  --   --  1.7  --   PHOS  --   --  3.6  --   --   --   --    < > = values in this interval not displayed.    GFR: Estimated Creatinine Clearance: 76 mL/min (A) (by C-G formula based on SCr of 0.46 mg/dL (L)).  Liver Function Tests: Recent Labs  Lab 12/06/19 1438 12/06/19 1659  AST 22 21  ALT 9 10  ALKPHOS 55 59  BILITOT 0.3 0.4  PROT 7.1 7.5  ALBUMIN 3.3* 3.3*    CBG: Recent Labs  Lab 12/07/19 0157 12/07/19 0426 12/07/19 0605 12/07/19 0827 12/07/19 1240  GLUCAP 79 67* 95 78 126*     Recent Results (from the past 240 hour(s))  SARS CORONAVIRUS 2 (TAT 6-24 HRS) Nasopharyngeal Nasopharyngeal Swab     Status: None   Collection Time: 12/06/19  4:01 PM   Specimen: Nasopharyngeal Swab  Result Value Ref Range Status   SARS Coronavirus 2 NEGATIVE NEGATIVE Final    Comment: (NOTE) SARS-CoV-2 target nucleic acids are NOT DETECTED. The SARS-CoV-2 RNA is generally detectable in upper and lower respiratory specimens during the acute phase of infection. Negative results do not preclude SARS-CoV-2 infection, do not rule out co-infections with other pathogens, and should not be used as the sole basis for treatment or other patient management decisions. Negative results must be combined with clinical observations, patient history, and epidemiological information. The expected result is Negative. Fact Sheet for Patients: 12/08/19 Fact Sheet for Healthcare Providers: HairSlick.no This test is not yet approved or cleared by the quierodirigir.com FDA and  has been authorized for detection and/or diagnosis of SARS-CoV-2 by FDA under an Emergency Use Authorization (EUA). This EUA will remain  in effect (meaning this test can be  used) for the duration of the COVID-19 declaration under Section 56 4(b)(1) of the Act, 21 U.S.C. section 360bbb-3(b)(1), unless the authorization is terminated or revoked sooner. Performed at [facility], [address] [place], [place]          Radiology Studies: CT HEAD WO CONTRAST  Result Date: 12/06/2019 CLINICAL DATA:  Weakness, confusion EXAM: CT HEAD WITHOUT CONTRAST TECHNIQUE: Contiguous axial images were obtained from the base of the skull through the vertex without intravenous contrast. COMPARISON:  11/11/2019 FINDINGS: Brain: No evidence of acute infarction, hemorrhage, hydrocephalus, extra-axial  collection or mass lesion/mass effect. Incidental note of cavum septum pellucidum et vergae variant of the lateral ventricles. Nonacute lacunar infarction of the anterior limb of the left internal capsule and adjacent corona radiata. Periventricular and deep white matter hypodensity with moderate global volume loss. Vascular: No hyperdense vessel or unexpected calcification. Skull: Normal. Negative for fracture or focal lesion. Sinuses/Orbits: No acute finding. Other: None. IMPRESSION: 1.  No acute intracranial pathology. 2. 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 Electronically Signed   By: Eddie Candle M.D.   On: 12/06/2019 16:40   US Abdomen Complete  Result Date: 12/07/2019 CLINICAL DATA:  Abdominal distension. EXAM: ABDOMEN ULTRASOUND COMPLETE COMPARISON:  None. FINDINGS: Gallbladder: No gallstones or wall thickening visualized. No sonographic Murphy sign noted by sonographer. Common bile duct: Diameter: 8 mm which is within normal limits. Liver: Heterogeneous echotexture of hepatic parenchyma is noted. No focal abnormality is noted. Portal vein is patent on color Doppler imaging with normal direction of blood flow towards the liver. IVC: No abnormality visualized. Pancreas: Not well visualized due to overlying bowel  gas. Spleen: Size and appearance within normal limits. Right Kidney: Length: 10.4 cm. Echogenicity within normal limits. No mass or hydronephrosis visualized. Left Kidney: Length: 10.3 cm. Echogenicity within normal limits. No mass or hydronephrosis visualized. Abdominal aorta: No aneurysm visualized. Other findings: Mild urinary bladder distention is noted with calculated volume of 350 mL. IMPRESSION: Heterogeneous echotexture of hepatic parenchyma is noted suggesting possible diffuse hepatocellular disease. Mild urinary bladder distention is noted. Electronically Signed   By: Marijo Conception M.D.   On: 12/07/2019 10:46   DG Chest Portable 1 View  Result Date: 12/06/2019 CLINICAL DATA:  Weakness and confusion. Hypoglycemia. EXAM: PORTABLE CHEST 1 VIEW COMPARISON:  11/11/2019 FINDINGS: Mild cardiomegaly. Tortuous aorta. The lungs are clear. The vascularity is normal. No effusions. No acute bone finding. IMPRESSION: No active disease. Mild cardiomegaly. Electronically Signed   By: Nelson Chimes M.D.   On: 12/06/2019 15:15        Scheduled Meds: . enoxaparin (LOVENOX) injection  40 mg Subcutaneous Q24H  . folic acid  1 mg Oral Daily  . multivitamin with minerals  1 tablet Oral Daily  . pantoprazole  40 mg Oral Daily  . thiamine  100 mg Oral Daily   Or  . thiamine  100 mg Intravenous Daily   Continuous Infusions: . dextrose 5 % and 0.9% NaCl 75 mL/hr at 12/06/19 1706     LOS: 1 day    Time spent: [place], MD [facility]   To contact the attending provider between 7A-7P or the covering provider during after hours 7P-7A, please log into the web site [email or web address] and access using universal [place] password for that web site. If you do not have the password, please call the hospital operator.  12/07/2019, 5:45 PM

## 2019-12-07 NOTE — ED Notes (Signed)
Pt's niece [name] updated on pt's status and plan of care
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
## 2019-12-07 NOTE — Progress Notes (Signed)
CH visited with Pt in response to OR for AD. Upon arrival, Pt asked if Ch could get him his walker. Ch said she would get his nurse. Ch could not find RN at this time. Another RN came and spoke with him , and let him know that he is in the hospital, and that he cannot use walker and go home like that. Pt looks really confused. He reported that there was no family support. Pt is not in a position to complete AD. Pt asked for prayer. Ch prayed with Pt, let Pt know chaplain availability and left.

## 2019-12-07 NOTE — ED Notes (Signed)
Ready bed @ 1150, patient going to room 104

## 2019-12-08 DIAGNOSIS — I4581 Long QT syndrome: Secondary | ICD-10-CM

## 2019-12-08 LAB — BASIC METABOLIC PANEL
Anion gap: 9 (ref 5–15)
Anion gap: 6 (ref 5–15)
Anion gap: 9 (ref 5–15)
BUN: <5 mg/dL — ABNORMAL LOW (ref 8–23)
BUN: <5 mg/dL — ABNORMAL LOW (ref 8–23)
BUN: <5 mg/dL — ABNORMAL LOW (ref 8–23)
CO2: 22 mmol/L (ref 22–32)
CO2: 23 mmol/L (ref 22–32)
CO2: 19 mmol/L — ABNORMAL LOW (ref 22–32)
Calcium: 8.7 mg/dL — ABNORMAL LOW (ref 8.9–10.3)
Calcium: 8.6 mg/dL — ABNORMAL LOW (ref 8.9–10.3)
Calcium: 8.7 mg/dL — ABNORMAL LOW (ref 8.9–10.3)
Chloride: 91 mmol/L — ABNORMAL LOW (ref 98–111)
Chloride: 93 mmol/L — ABNORMAL LOW (ref 98–111)
Chloride: 95 mmol/L — ABNORMAL LOW (ref 98–111)
Creatinine, Ser: 0.36 mg/dL — ABNORMAL LOW (ref 0.61–1.24)
Creatinine, Ser: 0.34 mg/dL — ABNORMAL LOW (ref 0.61–1.24)
Creatinine, Ser: 0.34 mg/dL — ABNORMAL LOW (ref 0.61–1.24)
GFR calc Af Amer: >60 mL/min (ref >60)
GFR calc Af Amer: >60 mL/min (ref >60)
GFR calc Af Amer: >60 mL/min (ref >60)
GFR calc non Af Amer: >60 mL/min (ref >60)
GFR calc non Af Amer: >60 mL/min (ref >60)
GFR calc non Af Amer: >60 mL/min (ref >60)
Glucose, Bld: 98 mg/dL (ref 70–99)
Glucose, Bld: 85 mg/dL (ref 70–99)
Glucose, Bld: 84 mg/dL (ref 70–99)
Potassium: 4.1 mmol/L (ref 3.5–5.1)
Potassium: 3.8 mmol/L (ref 3.5–5.1)
Potassium: 4.7 mmol/L (ref 3.5–5.1)
Sodium: 122 mmol/L — ABNORMAL LOW (ref 135–145)
Sodium: 122 mmol/L — ABNORMAL LOW (ref 135–145)
Sodium: 123 mmol/L — ABNORMAL LOW (ref 135–145)

## 2019-12-08 LAB — GLUCOSE, CAPILLARY
Glucose-Capillary: 102 mg/dL — ABNORMAL HIGH (ref 70–99)
Glucose-Capillary: 87 mg/dL (ref 70–99)

## 2019-12-08 MED ORDER — SODIUM CHLORIDE 1 G PO TABS
1.0000 g | ORAL_TABLET | Freq: Three times a day (TID) | ORAL | Status: DC
  Administered 2019-12-09 – 2019-12-16 (×22): 1 g via ORAL
  Filled 2019-12-08 (×26): qty 1

## 2019-12-08 NOTE — Progress Notes (Signed)
PT Cancellation Note  Patient Details Name: Javier Robinson MRN: [id number] DOB: 1945-09-16   Cancelled Treatment:    Reason Eval/Treat Not Completed: Medical issues which prohibited therapy(Chart reviewed, noted most recent HypoNa+: 122. Will hold until at more appropriate level for PT services.)  9:17 AM, 12/08/19 Rosamaria Lints, PT, DPT Physical Therapist - [facility]  [phone] (ASCOM)    Buccola,[name] 12/08/2019, 9:17 AM
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
## 2019-12-08 NOTE — Progress Notes (Signed)
PROGRESS NOTE    Javier Robinson  PFX:[id number] DOB: 1946/05/21 DOA: 12/06/2019 PCP: [facility]      Assessment & Plan:   Principal Problem:   Hypoglycemia Active Problems:   Hyponatremia   Hypomagnesemia   Hypertensive urgency   Acute encephalopathy   Alcohol dependence (HCC)   Prolonged QT interval   Malnutrition of moderate degree  Acute on chronic hyponatremia: likely secondary to ongoing alcohol use. Continue on IVFs. Nephro consulted   Hypertensive urgency: not on any meds. Hydralazine prn  Hypokalemia: WNL today. Will continue to monitor   Prolonged QT interval: electrolytes repleted.  Still has prolonged QTC on follow-up EKG. Continue on tele   Alcohol abuse: no signs of withdrawal.  Monitor on CIWA.  Alcohol cessation counseling. Continue thiamine, folate and multivitamin.  Failure to thrive: PT consult.  Will benefit from SNF.  Abd distention: no ascites on ultrasound abdomen.  Shows heterogeneous echotexture of liver parenchyma suggestive of possible diffuse liver disease on [place]. Likely secondary to alcohol abuse     DVT prophylaxis: lovenox  Code Status: full  Family Communication: Disposition Plan: likely will d/c to SNF. Depends on PT/OT recs   Consultants:   nephro   Procedures:    Antimicrobials:   Subjective: Pt is very lethargic.   Objective: Vitals:   12/07/19 1326 12/08/19 0028 12/08/19 0412 12/08/19 0751  BP: (!) 154/74 (!) 173/91 (!) 171/82 (!) 133/54  Pulse: 100     Resp: 16 20 15 19   Temp: 98.6 F (37 C) 98.9 F (37.2 C)  98.8 F (37.1 C)  TempSrc: Oral Oral  Oral  SpO2: 96%   96%  Weight: 71.7 kg     Height: 5\' 8"  (1.727 m)       Intake/Output Summary (Last 24 hours) at 12/08/2019 0753 Last data filed at 12/08/2019 0339 Gross per 24 hour  Intake 240 ml  Output 550 ml  Net -310 ml   Filed Weights   12/06/19 1432 12/07/19 1326  Weight: 65 kg 71.7 kg    Examination:  General exam:  Appears calm but lethargic  Respiratory system: diminished breath sounds b/l Cardiovascular system: S1 & S2 +. No rubs, gallops or clicks. B/l LE edema Gastrointestinal system: Abdomen is nondistended, soft and nontender. Normal bowel sounds heard. Central nervous system: lethargic. Moves all 4 extremities  Psychiatry: Judgement and insight appear abnormal. Flat mood and affect    Data Reviewed: I have personally reviewed following labs and imaging studies  CBC: Recent Labs  Lab 12/06/19 1438 12/06/19 1659 12/07/19 0559  WBC 4.2 4.1 5.1  NEUTROABS 1.1*  --   --   HGB 9.3* 9.6* 9.7*  HCT 27.8* 28.2* 27.1*  MCV 90.6 89.2 85.2  PLT 276 293 266   Basic Metabolic Panel: Recent Labs  Lab 12/06/19 1438 12/06/19 1438 12/06/19 1659 12/06/19 2050 12/07/19 0032 12/07/19 0559 12/07/19 1329 12/07/19 2148 12/08/19 0416  NA 119*   < > 120*   < > 121* 122* 120* 121* 122*  K 3.5   < > 3.6   < > 3.6 4.0 4.2 4.5 4.1  CL 88*   < > 87*   < > 91* 95* 92* 92* 91*  CO2 21*   < > 23   < > 22 22 21* 22 22  GLUCOSE 190*   < > 56*   < > 125* 157* 107* 102* 98  BUN 9   < > 8   < >  8 7* 6* 6* <5*  CREATININE 0.46*   < > 0.36*   < > 0.41* 0.32* 0.46* 0.44* 0.36*  CALCIUM 8.3*   < > 8.7*   < > 8.4* 8.3* 8.6* 8.9 8.7*  MG 1.6*  --  2.4  --   --  1.7  --   --   --   PHOS  --   --  3.6  --   --   --   --   --   --    < > = values in this interval not displayed.   GFR: Estimated Creatinine Clearance: 76 mL/min (A) (by C-G formula based on SCr of 0.36 mg/dL (L)). Liver Function Tests: Recent Labs  Lab 12/06/19 1438 12/06/19 1659  AST 22 21  ALT 9 10  ALKPHOS 55 59  BILITOT 0.3 0.4  PROT 7.1 7.5  ALBUMIN 3.3* 3.3*   No results for input(s): LIPASE, AMYLASE in the last 168 hours. Recent Labs  Lab 12/06/19 1659  AMMONIA 14   Coagulation Profile: No results for input(s): INR, PROTIME in the last 168 hours. Cardiac Enzymes: No results for input(s): CKTOTAL, CKMB, CKMBINDEX, TROPONINI in  the last 168 hours. BNP (last 3 results) No results for input(s): PROBNP in the last 8760 hours. HbA1C: No results for input(s): HGBA1C in the last 72 hours. CBG: Recent Labs  Lab 12/07/19 0157 12/07/19 0426 12/07/19 0605 12/07/19 0827 12/07/19 1240  GLUCAP 79 67* 95 78 126*   Lipid Profile: No results for input(s): CHOL, HDL, LDLCALC, TRIG, CHOLHDL, LDLDIRECT in the last 72 hours. Thyroid Function Tests: No results for input(s): TSH, T4TOTAL, FREET4, T3FREE, THYROIDAB in the last 72 hours. Anemia Panel: No results for input(s): VITAMINB12, FOLATE, FERRITIN, TIBC, IRON, RETICCTPCT in the last 72 hours. Sepsis Labs: No results for input(s): PROCALCITON, LATICACIDVEN in the last 168 hours.  Recent Results (from the past 240 hour(s))  SARS CORONAVIRUS 2 (TAT 6-24 HRS) Nasopharyngeal Nasopharyngeal Swab     Status: None   Collection Time: 12/06/19  4:01 PM   Specimen: Nasopharyngeal Swab  Result Value Ref Range Status   SARS Coronavirus 2 NEGATIVE NEGATIVE Final    Comment: (NOTE) SARS-CoV-2 target nucleic acids are NOT DETECTED. The SARS-CoV-2 RNA is generally detectable in upper and lower respiratory specimens during the acute phase of infection. Negative results do not preclude SARS-CoV-2 infection, do not rule out co-infections with other pathogens, and should not be used as the sole basis for treatment or other patient management decisions. Negative results must be combined with clinical observations, patient history, and epidemiological information. The expected result is Negative. Fact Sheet for Patients: HairSlick.no Fact Sheet for Healthcare Providers: quierodirigir.com This test is not yet approved or cleared by the [place] FDA and  has been authorized for detection and/or diagnosis of SARS-CoV-2 by FDA under an Emergency Use Authorization (EUA). This EUA will remain  in effect (meaning this test can be  used) for the duration of the COVID-19 declaration under Section 56 4(b)(1) of the Act, 21 U.S.C. section 360bbb-3(b)(1), unless the authorization is terminated or revoked sooner. Performed at [facility], [address] [place], [place] [address]          Radiology Studies: CT HEAD WO CONTRAST  Result Date: 12/06/2019 CLINICAL DATA:  Weakness, confusion EXAM: CT HEAD WITHOUT CONTRAST TECHNIQUE: Contiguous axial images were obtained from the base of the skull through the vertex without intravenous contrast. COMPARISON:  11/11/2019 FINDINGS: Brain: No evidence  of acute infarction, hemorrhage, hydrocephalus, extra-axial collection or mass lesion/mass effect. Incidental note of cavum septum pellucidum et vergae variant of the lateral ventricles. Nonacute lacunar infarction of the anterior limb of the left internal capsule and adjacent corona radiata. Periventricular and deep white matter hypodensity with moderate global volume loss. Vascular: No hyperdense vessel or unexpected calcification. Skull: Normal. Negative for fracture or focal lesion. Sinuses/Orbits: No acute finding. Other: None. IMPRESSION: 1.  No acute intracranial pathology. 2. 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 Electronically Signed   By: Eddie Candle M.D.   On: 12/06/2019 16:40   US Abdomen Complete  Result Date: 12/07/2019 CLINICAL DATA:  Abdominal distension. EXAM: ABDOMEN ULTRASOUND COMPLETE COMPARISON:  None. FINDINGS: Gallbladder: No gallstones or wall thickening visualized. No sonographic Murphy sign noted by sonographer. Common bile duct: Diameter: 8 mm which is within normal limits. Liver: Heterogeneous echotexture of hepatic parenchyma is noted. No focal abnormality is noted. Portal vein is patent on color Doppler imaging with normal direction of blood flow towards the liver. IVC: No abnormality visualized. Pancreas: Not well visualized due to overlying bowel  gas. Spleen: Size and appearance within normal limits. Right Kidney: Length: 10.4 cm. Echogenicity within normal limits. No mass or hydronephrosis visualized. Left Kidney: Length: 10.3 cm. Echogenicity within normal limits. No mass or hydronephrosis visualized. Abdominal aorta: No aneurysm visualized. Other findings: Mild urinary bladder distention is noted with calculated volume of 350 mL. IMPRESSION: Heterogeneous echotexture of hepatic parenchyma is noted suggesting possible diffuse hepatocellular disease. Mild urinary bladder distention is noted. Electronically Signed   By: Marijo Conception M.D.   On: 12/07/2019 10:46   DG Chest Portable 1 View  Result Date: 12/06/2019 CLINICAL DATA:  Weakness and confusion. Hypoglycemia. EXAM: PORTABLE CHEST 1 VIEW COMPARISON:  11/11/2019 FINDINGS: Mild cardiomegaly. Tortuous aorta. The lungs are clear. The vascularity is normal. No effusions. No acute bone finding. IMPRESSION: No active disease. Mild cardiomegaly. Electronically Signed   By: Nelson Chimes M.D.   On: 12/06/2019 15:15        Scheduled Meds: . amLODipine  5 mg Oral Daily  . enoxaparin (LOVENOX) injection  40 mg Subcutaneous Q24H  . folic acid  1 mg Oral Daily  . multivitamin with minerals  1 tablet Oral Daily  . pantoprazole  40 mg Oral Daily  . tamsulosin  0.4 mg Oral QPC supper  . thiamine  100 mg Oral Daily   Or  . thiamine  100 mg Intravenous Daily   Continuous Infusions: . dextrose 5 % and 0.9% NaCl 75 mL/hr at 12/08/19 0408     LOS: 2 days    Time spent: 30 mins     Wyvonnia Dusky, MD [facility] Pager [phone]  If 7PM-7AM, please contact night-coverage [email or web address] 12/08/2019, 7:53 AM

## 2019-12-08 NOTE — Progress Notes (Signed)
OT Cancellation Note  Patient Details Name: BRENTLEE DELAGE MRN: [id number] DOB: Nov 21, 1945   Cancelled Treatment:    Reason Eval/Treat Not Completed: Medical issues which prohibited therapy  OT consult received and medical chart reviewed.  Pt with hyponatremia, Na of 122 this morning.  Will continue to follow up when pt is more medically stable and appropriate for therapy.  Kathyrn Drown Farah Benish, OTR/L 12/08/19, 1:02 PM
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
## 2019-12-09 ENCOUNTER — Inpatient Hospital Stay: Payer: Medicare Other

## 2019-12-09 LAB — BASIC METABOLIC PANEL
Anion gap: 6 (ref 5–15)
BUN: <5 mg/dL — ABNORMAL LOW (ref 8–23)
CO2: 22 mmol/L (ref 22–32)
Calcium: 8.3 mg/dL — ABNORMAL LOW (ref 8.9–10.3)
Chloride: 92 mmol/L — ABNORMAL LOW (ref 98–111)
Creatinine, Ser: 0.33 mg/dL — ABNORMAL LOW (ref 0.61–1.24)
GFR calc Af Amer: >60 mL/min (ref >60)
GFR calc non Af Amer: >60 mL/min (ref >60)
Glucose, Bld: 79 mg/dL (ref 70–99)
Potassium: 3.8 mmol/L (ref 3.5–5.1)
Sodium: 120 mmol/L — ABNORMAL LOW (ref 135–145)

## 2019-12-09 LAB — CBC
HCT: 24.8 % — ABNORMAL LOW (ref 39.0–52.0)
Hemoglobin: 8.5 g/dL — ABNORMAL LOW (ref 13.0–17.0)
MCH: 30.2 pg (ref 26.0–34.0)
MCHC: 34.3 g/dL (ref 30.0–36.0)
MCV: 88.3 fL (ref 80.0–100.0)
Platelets: 242 10*3/uL (ref 150–400)
RBC: 2.81 MIL/uL — ABNORMAL LOW (ref 4.22–5.81)
RDW: 16.6 % — ABNORMAL HIGH (ref 11.5–15.5)
WBC: 4.2 10*3/uL (ref 4.0–10.5)
nRBC: 0 % (ref 0.0–0.2)

## 2019-12-09 LAB — GLUCOSE, CAPILLARY
Glucose-Capillary: 74 mg/dL (ref 70–99)
Glucose-Capillary: 83 mg/dL (ref 70–99)
Glucose-Capillary: 121 mg/dL — ABNORMAL HIGH (ref 70–99)
Glucose-Capillary: 95 mg/dL (ref 70–99)

## 2019-12-09 NOTE — TOC Progression Note (Signed)
Transition of Care [facility]) - Progression Note    Patient Details  Name: VANNAK MONTENEGRO MRN: [id number] Date of Birth: 09-01-1945  Transition of Care [facility]) CM/SW Contact  Allayne Butcher, RN Phone Number: 12/09/2019, 3:30 PM  Clinical Narrative:    OT is recommending skilled nursing for short term rehab, PT recommendations are still pending.  This RNCM spoke with the patient's niece, Elita Quick, who thinks that the patient needs long term care.  [name] reports that she will be coming up to speak with him tomorrow about what he wants.  This RNCM started bed search, patient has recently been a [facility].    Expected Discharge Plan: Skilled Nursing Facility    Expected Discharge Plan and Services Expected Discharge Plan: Skilled Nursing Facility                                               Social Determinants of Health (SDOH) Interventions    Readmission Risk Interventions No flowsheet data found.

## 2019-12-09 NOTE — Progress Notes (Signed)
[facility]  ROUNDING NOTE   Subjective:  Patient seen and evaluated bedside. Currently n.p.o. status as it is felt that he may be aspirating. Serum sodium has dropped again to 120.   Objective:  Vital signs in last 24 hours:  Temp:  [98 F (36.7 C)-98.6 F (37 C)] 98.5 F (36.9 C) (04/22 0738) Pulse Rate:  [85] 85 (04/22 0738) Resp:  [13-23] 13 (04/22 0738) BP: (138-141)/(66-72) 138/72 (04/22 0738) SpO2:  [96 %-98 %] 98 % (04/22 0738)  Weight change:  Filed Weights   12/06/19 1432 12/07/19 1326  Weight: 65 kg 71.7 kg    Intake/Output: I/O last 3 completed shifts: In: 2501.1 [I.V.:2501.1] Out: 1050 [Urine:1050]   Intake/Output this shift:  No intake/output data recorded.  Physical Exam: General: Disheveled  Head: Normocephalic, atraumatic. Moist oral mucosal membranes  Eyes: Anicteric  Neck: Supple, trachea midline  Lungs:  Clear to auscultation, normal effort  Heart: S1S2 no rubs  Abdomen:  Soft, nontender, bowel sounds present  Extremities: No peripheral edema.  Neurologic: Awake, alert, but confused  Skin: No lesions       Basic Metabolic Panel: Recent Labs  Lab 12/06/19 1438 12/06/19 1438 12/06/19 1659 12/06/19 2050 12/07/19 0559 12/07/19 1329 12/07/19 2148 12/07/19 2148 12/08/19 0416 12/08/19 0416 12/08/19 1011 12/08/19 1523 12/09/19 0802  NA 119*   < > 120*   < > 122*   < > 121*  --  122*  --  122* 123* 120*  K 3.5   < > 3.6   < > 4.0   < > 4.5  --  4.1  --  3.8 4.7 3.8  CL 88*   < > 87*   < > 95*   < > 92*  --  91*  --  93* 95* 92*  CO2 21*   < > 23   < > 22   < > 22  --  22  --  23 19* 22  GLUCOSE 190*   < > 56*   < > 157*   < > 102*  --  98  --  85 84 79  BUN 9   < > 8   < > 7*   < > 6*  --  <5*  --  <5* <5* <5*  CREATININE 0.46*   < > 0.36*   < > 0.32*   < > 0.44*  --  0.36*  --  0.34* 0.34* 0.33*  CALCIUM 8.3*   < > 8.7*   < > 8.3*   < > 8.9   < > 8.7*   < > 8.6* 8.7* 8.3*  MG 1.6*  --  2.4  --  1.7  --   --   --   --    --   --   --   --   PHOS  --   --  3.6  --   --   --   --   --   --   --   --   --   --    < > = values in this interval not displayed.    Liver Function Tests: Recent Labs  Lab 12/06/19 1438 12/06/19 1659  AST 22 21  ALT 9 10  ALKPHOS 55 59  BILITOT 0.3 0.4  PROT 7.1 7.5  ALBUMIN 3.3* 3.3*   No results for input(s): LIPASE, AMYLASE in the last 168 hours. Recent Labs  Lab 12/06/19 1659  AMMONIA 14  CBC: Recent Labs  Lab 12/06/19 1438 12/06/19 1659 12/07/19 0559 12/09/19 0802  WBC 4.2 4.1 5.1 4.2  NEUTROABS 1.1*  --   --   --   HGB 9.3* 9.6* 9.7* 8.5*  HCT 27.8* 28.2* 27.1* 24.8*  MCV 90.6 89.2 85.2 88.3  PLT 276 293 266 242    Cardiac Enzymes: No results for input(s): CKTOTAL, CKMB, CKMBINDEX, TROPONINI in the last 168 hours.  BNP: Invalid input(s): POCBNP  CBG: Recent Labs  Lab 12/07/19 0827 12/07/19 1240 12/08/19 1125 12/08/19 2105 12/09/19 0802  GLUCAP 78 126* 102* 87 74    Microbiology: Results for orders placed or performed during the hospital encounter of 12/06/19  SARS CORONAVIRUS 2 (TAT 6-24 HRS) Nasopharyngeal Nasopharyngeal Swab     Status: None   Collection Time: 12/06/19  4:01 PM   Specimen: Nasopharyngeal Swab  Result Value Ref Range Status   SARS Coronavirus 2 NEGATIVE NEGATIVE Final    Comment: (NOTE) SARS-CoV-2 target nucleic acids are NOT DETECTED. The SARS-CoV-2 RNA is generally detectable in upper and lower respiratory specimens during the acute phase of infection. Negative results do not preclude SARS-CoV-2 infection, do not rule out co-infections with other pathogens, and should not be used as the sole basis for treatment or other patient management decisions. Negative results must be combined with clinical observations, patient history, and epidemiological information. The expected result is Negative. Fact Sheet for Patients: HairSlick.no Fact Sheet for Healthcare  Providers: quierodirigir.com This test is not yet approved or cleared by the [place] FDA and  has been authorized for detection and/or diagnosis of SARS-CoV-2 by FDA under an Emergency Use Authorization (EUA). This EUA will remain  in effect (meaning this test can be used) for the duration of the COVID-19 declaration under Section 56 4(b)(1) of the Act, 21 U.S.C. section 360bbb-3(b)(1), unless the authorization is terminated or revoked sooner. Performed at [facility], [address] [place], [place] [address]     Coagulation Studies: No results for input(s): LABPROT, INR in the last 72 hours.  Urinalysis: Recent Labs    12/06/19 1429  COLORURINE YELLOW*  LABSPEC 1.011  PHURINE 6.0  GLUCOSEU 150*  HGBUR NEGATIVE  BILIRUBINUR NEGATIVE  KETONESUR NEGATIVE  PROTEINUR NEGATIVE  NITRITE NEGATIVE  LEUKOCYTESUR NEGATIVE      Imaging: No results found.   Medications:   . dextrose 5 % and 0.9% NaCl 100 mL/hr at 12/09/19 0412   . amLODipine  5 mg Oral Daily  . enoxaparin (LOVENOX) injection  40 mg Subcutaneous Q24H  . folic acid  1 mg Oral Daily  . multivitamin with minerals  1 tablet Oral Daily  . pantoprazole  40 mg Oral Daily  . sodium chloride  1 g Oral TID WC  . tamsulosin  0.4 mg Oral QPC supper  . thiamine  100 mg Oral Daily   Or  . thiamine  100 mg Intravenous Daily   acetaminophen **OR** acetaminophen, hydrALAZINE, LORazepam **OR** LORazepam  Assessment/ Plan:  74 y.o. male with a PMHx of heavy alcohol abuse, back pain, seizure disorder, who was admitted to [facility] on 12/06/2019 for evaluation of confusion and altered mental status.  1.  Hyponatremia.  Suspect poor solute intake in setting of ETOH abuse. 2.  ETOH abuse.  3.  Anemia unspecified, normocytic.   Plan:  Serum sodium remains quite low at 120 at the moment.  He was unable to take the sodium chloride tablets as he is currently n.p.o.  However he is  maintained on  D5 normal saline at 100 cc/h.  Swallow eval to occur later today.  If he does not pass this and he is required to be n.p.o. for a prolonged period of time we then may need to consider using 3% saline infusion.  However he would need to be transferred to the stepdown unit in order to perform this.  Await results of the swallow eval before making further decisions.   LOS: 3 Arlicia Paquette 4/22/202112:58 PM

## 2019-12-09 NOTE — Progress Notes (Signed)
PROGRESS NOTE    Javier Robinson  GNO:[id number] DOB: December 14, 1945 DOA: 12/06/2019 PCP: [facility]      Assessment & Plan:   Principal Problem:   Hypoglycemia Active Problems:   Hyponatremia   Hypomagnesemia   Hypertensive urgency   Acute encephalopathy   Alcohol dependence (HCC)   Prolonged QT interval   Malnutrition of moderate degree  Acute on chronic hyponatremia: likely secondary to ongoing alcohol use. Labile. Continue on IVFs. Continue on NaCl tabs as per nephro. Nephro following and recs apprec   Hypertensive urgency: not on any meds. Resolved. Hydralazine prn  Hypokalemia: WNL today. Will continue to monitor   Prolonged QT interval: electrolytes repleted.  Still has prolonged QTC on follow-up EKG. Continue on tele   Alcohol abuse: no signs of withdrawal.  Monitor on CIWA.  Alcohol cessation counseling. Continue thiamine, folate and multivitamin.  Failure to thrive: PT consult. OT recs SNF. Will benefit from SNF.  Abd distention: no ascites on ultrasound abdomen.  Shows heterogeneous echotexture of liver parenchyma suggestive of possible diffuse liver disease on [place]. Likely secondary to alcohol abuse     DVT prophylaxis: lovenox  Code Status: full  Family Communication: Disposition Plan: likely will d/c to SNF.   Consultants:   nephro   Procedures:    Antimicrobials:   Subjective: Pt is lethargic still.   Objective: Vitals:   12/08/19 1047 12/08/19 1648 12/09/19 0000 12/09/19 0738  BP:  140/70 (!) 141/66 138/72  Pulse:    85  Resp: 15 (!) 23 16 13   Temp:  98.6 F (37 C) 98 F (36.7 C) 98.5 F (36.9 C)  TempSrc:  Axillary Oral Axillary  SpO2:  96%  98%  Weight:      Height:        Intake/Output Summary (Last 24 hours) at 12/09/2019 1109 Last data filed at 12/08/2019 1900 Gross per 24 hour  Intake 2501.1 ml  Output 500 ml  Net 2001.1 ml   Filed Weights   12/06/19 1432 12/07/19 1326  Weight: 65 kg 71.7 kg     Examination:  General exam: Appears calm but lethargic  Respiratory system: decreased breath sounds b/l. No wheezes Cardiovascular system: S1 & S2 +. No rubs, gallops or clicks. B/l LE edema Gastrointestinal system: Abdomen is nondistended, soft and nontender. Hypoactive bowel sounds heard. Central nervous system: lethargic. Moves all 4 extremities  Psychiatry: Judgement and insight appear abnormal. Flat mood and affect    Data Reviewed: I have personally reviewed following labs and imaging studies  CBC: Recent Labs  Lab 12/06/19 1438 12/06/19 1659 12/07/19 0559 12/09/19 0802  WBC 4.2 4.1 5.1 4.2  NEUTROABS 1.1*  --   --   --   HGB 9.3* 9.6* 9.7* 8.5*  HCT 27.8* 28.2* 27.1* 24.8*  MCV 90.6 89.2 85.2 88.3  PLT 276 293 266 242   Basic Metabolic Panel: Recent Labs  Lab 12/06/19 1438 12/06/19 1438 12/06/19 1659 12/06/19 2050 12/07/19 0559 12/07/19 1329 12/07/19 2148 12/08/19 0416 12/08/19 1011 12/08/19 1523 12/09/19 0802  NA 119*   < > 120*   < > 122*   < > 121* 122* 122* 123* 120*  K 3.5   < > 3.6   < > 4.0   < > 4.5 4.1 3.8 4.7 3.8  CL 88*   < > 87*   < > 95*   < > 92* 91* 93* 95* 92*  CO2 21*   < > 23   < >  22   < > 22 22 23  19* 22  GLUCOSE 190*   < > 56*   < > 157*   < > 102* 98 85 84 79  BUN 9   < > 8   < > 7*   < > 6* <5* <5* <5* <5*  CREATININE 0.46*   < > 0.36*   < > 0.32*   < > 0.44* 0.36* 0.34* 0.34* 0.33*  CALCIUM 8.3*   < > 8.7*   < > 8.3*   < > 8.9 8.7* 8.6* 8.7* 8.3*  MG 1.6*  --  2.4  --  1.7  --   --   --   --   --   --   PHOS  --   --  3.6  --   --   --   --   --   --   --   --    < > = values in this interval not displayed.   GFR: Estimated Creatinine Clearance: 76 mL/min (A) (by C-G formula based on SCr of 0.33 mg/dL (L)). Liver Function Tests: Recent Labs  Lab 12/06/19 1438 12/06/19 1659  AST 22 21  ALT 9 10  ALKPHOS 55 59  BILITOT 0.3 0.4  PROT 7.1 7.5  ALBUMIN 3.3* 3.3*   No results for input(s): LIPASE, AMYLASE in the last 168  hours. Recent Labs  Lab 12/06/19 1659  AMMONIA 14   Coagulation Profile: No results for input(s): INR, PROTIME in the last 168 hours. Cardiac Enzymes: No results for input(s): CKTOTAL, CKMB, CKMBINDEX, TROPONINI in the last 168 hours. BNP (last 3 results) No results for input(s): PROBNP in the last 8760 hours. HbA1C: No results for input(s): HGBA1C in the last 72 hours. CBG: Recent Labs  Lab 12/07/19 0827 12/07/19 1240 12/08/19 1125 12/08/19 2105 12/09/19 0802  GLUCAP 78 126* 102* 87 74   Lipid Profile: No results for input(s): CHOL, HDL, LDLCALC, TRIG, CHOLHDL, LDLDIRECT in the last 72 hours. Thyroid Function Tests: No results for input(s): TSH, T4TOTAL, FREET4, T3FREE, THYROIDAB in the last 72 hours. Anemia Panel: No results for input(s): VITAMINB12, FOLATE, FERRITIN, TIBC, IRON, RETICCTPCT in the last 72 hours. Sepsis Labs: No results for input(s): PROCALCITON, LATICACIDVEN in the last 168 hours.  Recent Results (from the past 240 hour(s))  SARS CORONAVIRUS 2 (TAT 6-24 HRS) Nasopharyngeal Nasopharyngeal Swab     Status: None   Collection Time: 12/06/19  4:01 PM   Specimen: Nasopharyngeal Swab  Result Value Ref Range Status   SARS Coronavirus 2 NEGATIVE NEGATIVE Final    Comment: (NOTE) SARS-CoV-2 target nucleic acids are NOT DETECTED. The SARS-CoV-2 RNA is generally detectable in upper and lower respiratory specimens during the acute phase of infection. Negative results do not preclude SARS-CoV-2 infection, do not rule out co-infections with other pathogens, and should not be used as the sole basis for treatment or other patient management decisions. Negative results must be combined with clinical observations, patient history, and epidemiological information. The expected result is Negative. Fact Sheet for Patients: 12/08/19 Fact Sheet for Healthcare Providers: HairSlick.no This test is not yet  approved or cleared by the quier[place]irigir.com FDA and  has been authorized for detection and/or diagnosis of SARS-CoV-2 by FDA under an Emergency Use Authorization (EUA). This EUA will remain  in effect (meaning this test can be used) for the duration of the COVID-19 declaration under Section 56 4(b)(1) of the Act, 21 U.S.C. section 360bbb-3(b)(1), unless the authorization is  terminated or revoked sooner. Performed at [facility], [place] [address], [place], [place] [address]          Radiology Studies: No results found.      Scheduled Meds: . amLODipine  5 mg Oral Daily  . enoxaparin (LOVENOX) injection  40 mg Subcutaneous Q24H  . folic acid  1 mg Oral Daily  . multivitamin with minerals  1 tablet Oral Daily  . pantoprazole  40 mg Oral Daily  . sodium chloride  1 g Oral TID WC  . tamsulosin  0.4 mg Oral QPC supper  . thiamine  100 mg Oral Daily   Or  . thiamine  100 mg Intravenous Daily   Continuous Infusions: . dextrose 5 % and 0.9% NaCl 100 mL/hr at 12/09/19 0412     LOS: 3 days    Time spent: 32 mins     Wyvonnia Dusky, MD [facility] Pager [phone]  If 7PM-7AM, please contact night-coverage [email or web address] 12/09/2019, 11:09 AM

## 2019-12-09 NOTE — Progress Notes (Signed)
PT Cancellation Note  Patient Details Name: Javier Robinson MRN: [id number] DOB: Jul 10, 1946   Cancelled Treatment:    Reason Eval/Treat Not Completed: Medical issues which prohibited therapy(Chart reviewed. Sodium down-trending from yesterday (now 120), remains outside of cutoff for exercise. Will hold PT evaluation, resume services once appropriate.)  9:24 AM, 12/09/19 Rosamaria Lints, PT, DPT Physical Therapist - [facility]  [phone] (ASCOM)    Benicia Bergevin C 12/09/2019, 9:24 AM

## 2019-12-09 NOTE — Evaluation (Signed)
Occupational Therapy Evaluation Patient Details Name: Javier Robinson MRN: [id number] DOB: [birth year] Today's Date: 12/09/2019    History of Present Illness Javier Robinson is a 74 year old male admitted for hypoglycemia and hyponatremia with altered mental status.  Pt's PMH includes chronic hyponatremia, malnutrition, alcohol abuse, and seizures.   Clinical Impression   Javier Robinson presents to OT with generalized weakness, fatigue, disorientation, likely decreased cognitive status, and decreased ability to complete functional tasks.  He presents with lethargy and disorientation, so unable to gather full picture of pt's living environment and level of assistance needed from caregivers at baseline.  He lives with a roommate and receives assistance from his family for all ADLs, including toileting, dressing, sponge bathing, household management, and community mobility.  He uses a RW for mobility but largely spends his days watching TV.  Out of bed assessment deferred 2/2 pt lethargy and hyponatremia.  OTR provided mod assist for bed mobility, assisting with BLE management and trunk.  Pt was able to assist in transfer.  Pt has decreased AROM of BUEs, likely requiring min assist for upper body ADLs. He likely requires mod-max assist in lower body dressing, bathing, and functional transfers.  Javier Robinson will continue to benefit from skilled OT services in acute setting to address generalized weakness and safety and independence in ADLs.  SNF is most appropriate discharge recommendation at this time.    Follow Up Recommendations  SNF    Equipment Recommendations  Other (comment)(defer to next level of care)    Recommendations for Other Services       Precautions / Restrictions Precautions Precautions: Fall Precaution Comments: NPO, aspiration precautions Restrictions Weight Bearing Restrictions: No      Mobility Bed Mobility Overal bed mobility: Needs Assistance Bed Mobility: Supine to Sit;Sit to  Supine     Supine to sit: Mod assist;HOB elevated Sit to supine: Mod assist   General bed mobility comments: OTR provided assist for BLEs and trunk in supine <> sit, pt able to initiate and assist with movements  Transfers Overall transfer level: Needs assistance               General transfer comment: Not tested    Balance Overall balance assessment: Needs assistance Sitting-balance support: Feet supported;Bilateral upper extremity supported Sitting balance-Leahy Scale: Poor                                     ADL either performed or assessed with clinical judgement   ADL Overall ADL's : Needs assistance/impaired                                       General ADL Comments: Pt generally requires min assist for upper body ADLs for setup and 2/2 generalized weakness.  Pt likely mod-max assist for lower body dressing and bathing, he is unable to reach feet while seated EOB or bring into figure 4 position to don/doff footwear.  No functional transfers attempted 2/2 pt weakness and lethargy.     Vision Baseline Vision/History: No visual deficits Patient Visual Report: Other (comment)(reports blurry vision/dizziness when changing positions) Vision Assessment?: No apparent visual deficits     Perception     Praxis      Pertinent Vitals/Pain Faces Pain Scale: Hurts little more Pain Location: B LEs with activity Pain Descriptors /  Indicators: Sore;Discomfort;Grimacing Pain Intervention(s): Limited activity within patient's tolerance;Monitored during session     Hand Dominance     Extremity/Trunk Assessment Upper Extremity Assessment Upper Extremity Assessment: Generalized weakness RUE Deficits / Details: Poor AROM- able to lift RUE to 90 degrees, LUE to ~45.  Pt with poor gross grasp & grip strength.   Lower Extremity Assessment Lower Extremity Assessment: Defer to PT evaluation   Cervical / Trunk Assessment Cervical / Trunk  Assessment: Normal   Communication Communication Communication: No difficulties   Cognition Arousal/Alertness: Lethargic Behavior During Therapy: WFL for tasks assessed/performed;Flat affect Overall Cognitive Status: No family/caregiver present to determine baseline cognitive functioning                                 General Comments: Pt required mod verbal cues to attend to evaluation, very drowsy.  Pt is disoriented and was unable to name date, day of week, year, or current president.  He is oriented to current setting and was able to state his birthday.  Able to follow one step commands with min verbal cues.   General Comments       Exercises Other Exercises Other Exercises: provided education on OT role and plan of care, fall and safety precautions, self care, and bed mobility Other Exercises: provided mod assist for bed mobility   Shoulder Instructions      Home Living Family/patient expects to be discharged to:: Private residence Living Arrangements: Non-relatives/Friends(has roommate) Available Help at Discharge: Friend(s);Available 24 hours/day(roommate and family member) Type of Home: House Home Access: Stairs to enter     Home Layout: One level     Bathroom Shower/Tub: Producer, television/film/video: Standard         Additional Comments: Pt is unreliable historian, some information gathered from medical record.  Pt reports living in a one bedroom house with a roommate, also has a family member and/or aide who helps with ADLs.      Prior Functioning/Environment Level of Independence: Needs assistance  Gait / Transfers Assistance Needed: Per chart/caregiver, patient requires assistance for functional transfers but can perform functional mobility using RW ADL's / Homemaking Assistance Needed: Pt reports receiving assistance for all ADLs: dressing, toileting, sponge bathing, medication management, community mobility, and household management.    Comments: Pt uses RW for mobility, spends his days watching TV        OT Problem List: Decreased strength;Decreased range of motion;Decreased activity tolerance;Impaired balance (sitting and/or standing);Decreased safety awareness;Decreased cognition;Decreased coordination;Impaired UE functional use;Decreased knowledge of use of DME or AE;Decreased knowledge of precautions      OT Treatment/Interventions: Self-care/ADL training;Therapeutic exercise;Energy conservation;DME and/or AE instruction;Therapeutic activities;Patient/family education;Balance training    OT Goals(Current goals can be found in the care plan section) Acute Rehab OT Goals Patient Stated Goal: Pt unable to state OT Goal Formulation: With patient Time For Goal Achievement: 12/23/19 Potential to Achieve Goals: Fair  OT Frequency: Min 1X/week   Barriers to D/C: Other (comment)  Unclear of how much assistance he recieves for ADLs and caregivers' ability to provide assistance       Co-evaluation              AM-PAC OT "6 Clicks" Daily Activity     Outcome Measure Help from another person eating meals?: A Little Help from another person taking care of personal grooming?: A Little Help from another person toileting, which includes using toliet, bedpan, or  urinal?: A Lot Help from another person bathing (including washing, rinsing, drying)?: A Lot Help from another person to put on and taking off regular upper body clothing?: A Little Help from another person to put on and taking off regular lower body clothing?: A Lot 6 Click Score: 15   End of Session    Activity Tolerance: Patient limited by fatigue Patient left: in bed;with call bell/phone within reach;with bed alarm set  OT Visit Diagnosis: Muscle weakness (generalized) (M62.81);Other abnormalities of gait and mobility (R26.89)                Time: 2992-4268 OT Time Calculation (min): 16 min Charges:  OT General Charges $OT Visit: 1 Visit OT  Evaluation $OT Eval Moderate Complexity: 1 Mod OT Treatments $Self Care/Home Management : 8-22 mins  Myrtie Hawk Christionna Poland, OTR/L 12/09/19, 10:17 AM

## 2019-12-09 NOTE — Care Management Important Message (Signed)
Important Message  Patient Details  Name: Javier Robinson MRN: [id number] Date of Birth: 07-13-1946   Medicare Important Message Given:  Yes     [name] 12/09/2019, 11:11 AM
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
## 2019-12-10 LAB — CBC
HCT: 22.7 % — ABNORMAL LOW (ref 39.0–52.0)
Hemoglobin: 7.7 g/dL — ABNORMAL LOW (ref 13.0–17.0)
MCH: 30.1 pg (ref 26.0–34.0)
MCHC: 33.9 g/dL (ref 30.0–36.0)
MCV: 88.7 fL (ref 80.0–100.0)
Platelets: 237 10*3/uL (ref 150–400)
RBC: 2.56 MIL/uL — ABNORMAL LOW (ref 4.22–5.81)
RDW: 16.6 % — ABNORMAL HIGH (ref 11.5–15.5)
WBC: 4.9 10*3/uL (ref 4.0–10.5)
nRBC: 0 % (ref 0.0–0.2)

## 2019-12-10 LAB — GLUCOSE, CAPILLARY
Glucose-Capillary: 84 mg/dL (ref 70–99)
Glucose-Capillary: 89 mg/dL (ref 70–99)
Glucose-Capillary: 98 mg/dL (ref 70–99)
Glucose-Capillary: 87 mg/dL (ref 70–99)

## 2019-12-10 LAB — OSMOLALITY: Osmolality: 253 mOsm/kg — ABNORMAL LOW (ref 275–295)

## 2019-12-10 LAB — SODIUM
Sodium: 123 mmol/L — ABNORMAL LOW (ref 135–145)
Sodium: 124 mmol/L — ABNORMAL LOW (ref 135–145)
Sodium: 123 mmol/L — ABNORMAL LOW (ref 135–145)
Sodium: 124 mmol/L — ABNORMAL LOW (ref 135–145)
Sodium: 122 mmol/L — ABNORMAL LOW (ref 135–145)

## 2019-12-10 LAB — OSMOLALITY, URINE: Osmolality, Ur: 693 mOsm/kg (ref 300–900)

## 2019-12-10 LAB — BASIC METABOLIC PANEL
Anion gap: 6 (ref 5–15)
BUN: <5 mg/dL — ABNORMAL LOW (ref 8–23)
CO2: 23 mmol/L (ref 22–32)
Calcium: 8.3 mg/dL — ABNORMAL LOW (ref 8.9–10.3)
Chloride: 94 mmol/L — ABNORMAL LOW (ref 98–111)
Creatinine, Ser: 0.35 mg/dL — ABNORMAL LOW (ref 0.61–1.24)
GFR calc Af Amer: >60 mL/min (ref >60)
GFR calc non Af Amer: >60 mL/min (ref >60)
Glucose, Bld: 89 mg/dL (ref 70–99)
Potassium: 3.6 mmol/L (ref 3.5–5.1)
Sodium: 123 mmol/L — ABNORMAL LOW (ref 135–145)

## 2019-12-10 LAB — SODIUM, URINE, RANDOM: Sodium, Ur: 185 mmol/L

## 2019-12-10 MED ORDER — SODIUM CHLORIDE 3 % IV SOLN
INTRAVENOUS | Status: DC
  Administered 2019-12-10 – 2019-12-12 (×3): 30 mL/h via INTRAVENOUS
  Filled 2019-12-10 (×3): qty 500

## 2019-12-10 NOTE — Evaluation (Signed)
Physical Therapy Evaluation Patient Details Name: REN ASPINALL MRN: [id number] DOB: [birth year] Today's Date: 12/10/2019   History of Present Illness  Esai Stecklein is a 74yoM who [facility] for hypoglycemia. Pt also noted to have hypoNa+ 119. PMH: ETOH abuse, chronic hyponatremia, dysphagia, dementia, GERD, HTN, CHF, CVA.  Clinical Impression  Pt admitted with above diagnosis. Chart reviewed, noting continued hyponatremia (Na+: 123) outside of range appropriate for PT, however given low other risk factors and balancing against the risk of now having been held by PT for 2 days now, decided to attempt evaluation anyway in hopes he is able to participate. Pt currently with functional limitations due to the deficits listed below (see "PT Problem List"). Upon entry, pt in bed, asleep, but does arouse and is agreeable to participate. Pt remains somnolent throughout, requires cues for eyes open without much relent.  The patient is a fairly limited historian but can provide some basic information about his PLOF in basic mobility. Pt is disoriented at times, but knows he is in the hospital. MaxA for bed mobility, MaxA to rise to standing, requires constant min-modA for standing balance, maintained for 60sec without any gross buckling. Pt has poor awareness of posterior lean, does not attempt to correct when cued. Functional mobility assessment demonstrates increased effort/time requirements, poor tolerance, and need for physical assistance, whereas the patient performed these at a higher level of independence PTA. Pt would benefit from STR to promote return to PLOF in transfers and gait, but may ultimately need ultimately need LTR at some point depending on what level of caregiver assistance is available at home. Pt will benefit from skilled PT intervention to increase independence and safety with basic mobility in preparation for discharge to the venue listed below.       Follow Up Recommendations  SNF    Equipment Recommendations  None recommended by PT;Other (comment)    Recommendations for Other Services       Precautions / Restrictions Precautions Precautions: Fall Precaution Comments: NPO, aspiration precautions Restrictions Weight Bearing Restrictions: No      Mobility  Bed Mobility   Bed Mobility: Supine to Sit;Sit to Supine     Supine to sit: Max assist Sit to supine: Max assist   General bed mobility comments: Pt says people usually help him with his legs, then unable to sit up without assist once feet are on floor.  Transfers Overall transfer level: Needs assistance Equipment used: None;1 person hand held assist Transfers: Sit to/from Stand Sit to Stand: Max assist         General transfer comment: able to remain in standing with min-mod support, sustained posterior lean. Sustained somnolence.  Ambulation/Gait                Stairs            Wheelchair Mobility    Modified Rankin (Stroke Patients Only)       Balance Overall balance assessment: Needs assistance Sitting-balance support: Single extremity supported;Feet supported;Feet unsupported Sitting balance-Leahy Scale: Fair     Standing balance support: Single extremity supported;During functional activity Standing balance-Leahy Scale: Zero                               Pertinent Vitals/Pain Pain Assessment: No/denies pain    Home Living Family/patient expects to be discharged to:: Private residence Living Arrangements: Non-relatives/Friends(friend Alinda Money lives there, helps him generally.) Available Help at  Discharge: Friend(s);Available 24 hours/day(has 2 sisters in [place], [place]) Type of Home: House Home Access: Stairs to enter Entrance Stairs-Rails: Left Entrance Stairs-Number of Steps: 3 Home Layout: One level Home Equipment: Cane - single point;Walker - 2 wheels;Shower seat;Wheelchair - manual Additional Comments: Uses WC daily; doesn't AMB in  house without [employer]; 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 Pt reports sleeping on a couch for s few years (since moving in with [facility]) as there is no other bedroom/bed.    Prior Function Level of Independence: Needs assistance               Hand Dominance        Extremity/Trunk Assessment   Upper Extremity Assessment Upper Extremity Assessment: Generalized weakness    Lower Extremity Assessment Lower Extremity Assessment: Generalized weakness       Communication      Cognition Arousal/Alertness: Lethargic Behavior During Therapy: WFL for tasks assessed/performed Overall Cognitive Status: History of cognitive impairments - at baseline                                 General Comments: Pt says he has trouble with memory today and at baseline. Pt asked what state he is in, to which he [place]. I pointed out that [place] is not a state, to which he does not respond.      General Comments      Exercises     Assessment/Plan    PT Assessment Patient needs continued PT services  PT Problem List Decreased strength;Decreased mobility;Decreased activity tolerance;Decreased balance;Pain;Decreased safety awareness;Decreased cognition       PT Treatment Interventions DME instruction;Therapeutic exercise;Gait training;Balance training;Functional mobility training;Therapeutic activities;Patient/family education    PT Goals (Current goals can be found in the Care Plan section)  Acute Rehab PT Goals Patient Stated Goal: regain strength and return to home with support from friend PT Goal Formulation: Patient unable to participate in goal setting Time For Goal Achievement: 12/10/19 Potential to Achieve Goals: Poor    Frequency Min 2X/week   Barriers to discharge Decreased caregiver support      Co-evaluation               AM-PAC PT "6 Clicks" Mobility  Outcome Measure Help needed turning from your back to your side while  in a flat bed without using bedrails?: A Lot Help needed moving from lying on your back to sitting on the side of a flat bed without using bedrails?: Total Help needed moving to and from a bed to a chair (including a wheelchair)?: Total Help needed standing up from a chair using your arms (e.g., wheelchair or bedside chair)?: Total Help needed to walk in hospital room?: Total Help needed climbing 3-5 steps with a railing? : Total 6 Click Score: 7    End of Session   Activity Tolerance: Patient limited by lethargy;No increased pain Patient left: in bed;with call bell/phone within reach;with bed alarm set Nurse Communication: Mobility status PT Visit Diagnosis: Other abnormalities of gait and mobility (R26.89);Difficulty in walking, not elsewhere classified (R26.2);Unsteadiness on feet (R26.81);Muscle weakness (generalized) (M62.81);History of falling (Z91.81)    Time: 2536-6440 PT Time Calculation (min) (ACUTE ONLY): 19 min   Charges:   PT Evaluation $PT Eval Moderate Complexity: 1 Mod          12:26 PM, 12/10/19 Rosamaria Lints, PT, DPT Physical Therapist - [facility]  [phone] ([place])    Zuleika Gallus C 12/10/2019, 12:06 PM

## 2019-12-10 NOTE — Consult Note (Signed)
MEDICATION RELATED CONSULT NOTE - INITIAL   Pharmacy Consult for Sodium Monitoring  Indication: 3% sodium chloride continuous infusion for hyponatremia   No Known Allergies  Patient Measurements: Height: 5\' 8"  (172.7 cm) Weight: 71.7 kg (158 lb 1.1 oz) IBW/kg (Calculated) : 68.4  Vital Signs: Temp: 98.3 F (36.8 C) (04/23 0000) Temp Source: Oral (04/23 0800) BP: 148/71 (04/23 0800) Pulse Rate: 84 (04/23 0800) Intake/Output from previous day: 04/22 0701 - 04/23 0700 In: 1375.2 [P.O.:240; I.V.:1135.2] Out: 1000 [Urine:1000] Intake/Output from this shift: No intake/output data recorded.  Labs: Recent Labs    12/08/19 1523 12/09/19 0802 12/10/19 0603  WBC  --  4.2 4.9  HGB  --  8.5* 7.7*  HCT  --  24.8* 22.7*  PLT  --  242 237  CREATININE 0.34* 0.33* 0.35*   BMP Latest Ref Rng & Units 12/10/2019 12/09/2019 12/08/2019  Glucose 70 - 99 mg/dL 89 79 84  BUN 8 - 23 mg/dL 12/10/2019) <8(B) <1(D)  Creatinine 0.61 - 1.24 mg/dL <1(V) 6.16(W) 7.37(T)  Sodium 135 - 145 mmol/L 123(L) 120(L) 123(L)  Potassium 3.5 - 5.1 mmol/L 3.6 3.8 4.7  Chloride 98 - 111 mmol/L 94(L) 92(L) 95(L)  CO2 22 - 32 mmol/L 23 22 19(L)  Calcium 8.9 - 10.3 mg/dL 8.3(L) 8.3(L) 8.7(L)    Estimated Creatinine Clearance: 76 mL/min (A) (by C-G formula based on SCr of 0.35 mg/dL (L)).   Medical History: Past Medical History:  Diagnosis Date  . Arthritis   . Pain    BACK. no specific injury   . Seizure (HCC) 2000   IN THE PAST. per patient, it was when he was drinking   Assessment: Pharmacy consulted for Sodium monitoring for 74 yo male admitted with hyponatremia. Patient is being initiated on Hypertonic (3%) saline.   DATE TIME Na Level 4/23 0603 123 4/23 1134 123   Goal of Therapy:  Na increase <10 mEq/24 hours   Plan:  If sodium rises > 4 mEq/L over 2 hours or > 6 mEq/L over 4 hours - Pharmacy will evaluate and contact MD (Dr. 5/23) per consult.  Hypertonic (3%) Saline started @ 78ml/hr.    Na levels every 2 hours for 2 occurrences, then Na level every 4 hours.   31m, PharmD, BCPS Clinical Pharmacist 12/10/2019 11:39 AM

## 2019-12-10 NOTE — TOC Progression Note (Signed)
Transition of Care [facility]) - Progression Note    Patient Details  Name: Javier Robinson MRN: [id number] Date of Birth: 02-15-1946  [facility]) CM/SW Contact  Allayne Butcher, RN Phone Number: 12/10/2019, 11:32 AM  Clinical Narrative:    Patient's sister and niece came to see patient this morning and discuss discharge plans.  After speaking with the sister and niece the patient agrees to go to rehab.  Patient has been to [facility] in the past and would like to go back there.  Bed request has been sent to [facility] waiting for response.    Expected Discharge Plan: Skilled Nursing Facility Barriers to Discharge: Continued Medical Work up  Expected Discharge Plan and Services Expected Discharge Plan: Skilled Nursing Facility     Post Acute Care Choice: Skilled Nursing Facility Living arrangements for the past 2 months: Single Family Home                                       Social Determinants of Health (SDOH) Interventions    Readmission Risk Interventions No flowsheet data found.

## 2019-12-10 NOTE — Progress Notes (Signed)
[facility]  ROUNDING NOTE   Subjective:  Serum sodium currently 124. Appears to still have issues with aspiration. Decision made to transition the patient to 3% saline.   Objective:  Vital signs in last 24 hours:  Temp:  [98.3 F (36.8 C)-98.4 F (36.9 C)] 98.3 F (36.8 C) (04/23 0000) Pulse Rate:  [78-90] 84 (04/23 0800) Resp:  [13-15] 13 (04/23 0800) BP: (114-148)/(48-80) 138/80 (04/23 1131) SpO2:  [95 %] 95 % (04/22 1600)  Weight change:  Filed Weights   12/06/19 1432 12/07/19 1326  Weight: 65 kg 71.7 kg    Intake/Output: I/O last 3 completed shifts: In: 2415.3 [P.O.:240; I.V.:2175.3] Out: 1000 [Urine:1000]   Intake/Output this shift:  No intake/output data recorded.  Physical Exam: General: Disheveled  Head: Normocephalic, atraumatic. Moist oral mucosal membranes  Eyes: Anicteric  Neck: Supple, trachea midline  Lungs:  Clear to auscultation, normal effort  Heart: S1S2 no rubs  Abdomen:  Soft, nontender, bowel sounds present  Extremities: No peripheral edema.  Neurologic: Awake, alert, but confused  Skin: No lesions       Basic Metabolic Panel: Recent Labs  Lab 12/06/19 1438 12/06/19 1438 12/06/19 1659 12/06/19 2050 12/07/19 0559 12/07/19 1329 12/08/19 0416 12/08/19 0416 12/08/19 1011 12/08/19 1011 12/08/19 1523 12/09/19 0802 12/10/19 0603 12/10/19 1134 12/10/19 1250  NA 119*   < > 120*   < > 122*   < > 122*   < > 122*   < > 123* 120* 123* 123* 124*  K 3.5   < > 3.6   < > 4.0   < > 4.1  --  3.8  --  4.7 3.8 3.6  --   --   CL 88*   < > 87*   < > 95*   < > 91*  --  93*  --  95* 92* 94*  --   --   CO2 21*   < > 23   < > 22   < > 22  --  23  --  19* 22 23  --   --   GLUCOSE 190*   < > 56*   < > 157*   < > 98  --  85  --  84 79 89  --   --   BUN 9   < > 8   < > 7*   < > <5*  --  <5*  --  <5* <5* <5*  --   --   CREATININE 0.46*   < > 0.36*   < > 0.32*   < > 0.36*  --  0.34*  --  0.34* 0.33* 0.35*  --   --   CALCIUM 8.3*   < > 8.7*    < > 8.3*   < > 8.7*   < > 8.6*   < > 8.7* 8.3* 8.3*  --   --   MG 1.6*  --  2.4  --  1.7  --   --   --   --   --   --   --   --   --   --   PHOS  --   --  3.6  --   --   --   --   --   --   --   --   --   --   --   --    < > = values in this interval not displayed.    Liver  Function Tests: Recent Labs  Lab 12/06/19 1438 12/06/19 1659  AST 22 21  ALT 9 10  ALKPHOS 55 59  BILITOT 0.3 0.4  PROT 7.1 7.5  ALBUMIN 3.3* 3.3*   No results for input(s): LIPASE, AMYLASE in the last 168 hours. Recent Labs  Lab 12/06/19 1659  AMMONIA 14    CBC: Recent Labs  Lab 12/06/19 1438 12/06/19 1659 12/07/19 0559 12/09/19 0802 12/10/19 0603  WBC 4.2 4.1 5.1 4.2 4.9  NEUTROABS 1.1*  --   --   --   --   HGB 9.3* 9.6* 9.7* 8.5* 7.7*  HCT 27.8* 28.2* 27.1* 24.8* 22.7*  MCV 90.6 89.2 85.2 88.3 88.7  PLT 276 293 266 242 237    Cardiac Enzymes: No results for input(s): CKTOTAL, CKMB, CKMBINDEX, TROPONINI in the last 168 hours.  BNP: Invalid input(s): POCBNP  CBG: Recent Labs  Lab 12/09/19 1330 12/09/19 1655 12/09/19 2154 12/10/19 0754 12/10/19 1202  GLUCAP 83 121* 95 84 89    Microbiology: Results for orders placed or performed during the hospital encounter of 12/06/19  SARS CORONAVIRUS 2 (TAT 6-24 HRS) Nasopharyngeal Nasopharyngeal Swab     Status: None   Collection Time: 12/06/19  4:01 PM   Specimen: Nasopharyngeal Swab  Result Value Ref Range Status   SARS Coronavirus 2 NEGATIVE NEGATIVE Final    Comment: (NOTE) SARS-CoV-2 target nucleic acids are NOT DETECTED. The SARS-CoV-2 RNA is generally detectable in upper and lower respiratory specimens during the acute phase of infection. Negative results do not preclude SARS-CoV-2 infection, do not rule out co-infections with other pathogens, and should not be used as the sole basis for treatment or other patient management decisions. Negative results must be combined with clinical observations, patient history, and  epidemiological information. The expected result is Negative. Fact Sheet for Patients: SugarRoll.be Fact Sheet for Healthcare Providers: https://www.woods-mathews.com/ This test is not yet approved or cleared by the [place] FDA and  has been authorized for detection and/or diagnosis of SARS-CoV-2 by FDA under an Emergency Use Authorization (EUA). This EUA will remain  in effect (meaning this test can be used) for the duration of the COVID-19 declaration under Section 56 4(b)(1) of the Act, 21 U.S.C. section 360bbb-3(b)(1), unless the authorization is terminated or revoked sooner. Performed at [facility], [place] [address], [place],  [address]     Coagulation Studies: No results for input(s): LABPROT, INR in the last 72 hours.  Urinalysis: No results for input(s): COLORURINE, LABSPEC, PHURINE, GLUCOSEU, HGBUR, BILIRUBINUR, KETONESUR, PROTEINUR, UROBILINOGEN, NITRITE, LEUKOCYTESUR in the last 72 hours.  Invalid input(s): APPERANCEUR    Imaging: No results found.   Medications:   . sodium chloride (hypertonic) 30 mL/hr (12/10/19 1309)   . amLODipine  5 mg Oral Daily  . enoxaparin (LOVENOX) injection  40 mg Subcutaneous Q24H  . folic acid  1 mg Oral Daily  . multivitamin with minerals  1 tablet Oral Daily  . pantoprazole  40 mg Oral Daily  . sodium chloride  1 g Oral TID WC  . tamsulosin  0.4 mg Oral QPC supper  . thiamine  100 mg Oral Daily   Or  . thiamine  100 mg Intravenous Daily   acetaminophen **OR** acetaminophen, hydrALAZINE  Assessment/ Plan:  74 y.o. male with a PMHx of heavy alcohol abuse, back pain, seizure disorder, who was admitted to [facility] on 12/06/2019 for evaluation of confusion and altered mental status.  1.  Hyponatremia.  Suspect poor solute intake in  setting of ETOH abuse. 2.  ETOH abuse.  3.  Anemia unspecified, normocytic.   Plan:  Patient still having issues with aspiration therefore  limiting his p.o. solute intake.  As such we will now attempt 3% saline at a rate of 30 cc/h and continue the patient on sodium chloride tablets as tolerated.  Further plan as patient progresses.   LOS: 4 Javier Robinson 4/23/20211:53 PM

## 2019-12-10 NOTE — Consult Note (Signed)
MEDICATION RELATED CONSULT NOTE - INITIAL   Pharmacy Consult for Sodium Monitoring  Indication: 3% sodium chloride continuous infusion for hyponatremia   No Known Allergies  Patient Measurements: Height: 5\' 8"  (172.7 cm) Weight: 71.7 kg (158 lb 1.1 oz) IBW/kg (Calculated) : 68.4  Vital Signs: Temp: 99.2 F (37.3 C) (04/23 2330) Temp Source: Oral (04/23 2330) BP: 136/66 (04/23 2330) Pulse Rate: 88 (04/23 2330) Intake/Output from previous day: 04/22 0701 - 04/23 0700 In: 1375.2 [P.O.:240; I.V.:1135.2] Out: 1000 [Urine:1000] Intake/Output from this shift: Total I/O In: -  Out: 650 [Urine:650]  Labs: Recent Labs    12/08/19 1523 12/09/19 0802 12/10/19 0603  WBC  --  4.2 4.9  HGB  --  8.5* 7.7*  HCT  --  24.8* 22.7*  PLT  --  242 237  CREATININE 0.34* 0.33* 0.35*   BMP Latest Ref Rng & Units 12/10/2019 12/10/2019 12/10/2019  Glucose 70 - 99 mg/dL - - -  BUN 8 - 23 mg/dL - - -  Creatinine 12/12/2019 - 1.24 mg/dL - - -  Sodium 7.82 - 423 mmol/L 122(L) 124(L) 123(L)  Potassium 3.5 - 5.1 mmol/L - - -  Chloride 98 - 111 mmol/L - - -  CO2 22 - 32 mmol/L - - -  Calcium 8.9 - 10.3 mg/dL - - -    Estimated Creatinine Clearance: 76 mL/min (A) (by C-G formula based on SCr of 0.35 mg/dL (L)).   Medical History: Past Medical History:  Diagnosis Date  . Arthritis   . Pain    BACK. no specific injury   . Seizure (HCC) 2000   IN THE PAST. per patient, it was when he was drinking   Assessment: Pharmacy consulted for Sodium monitoring for 74 yo male admitted with hyponatremia. Patient is being initiated on Hypertonic (3%) saline.   DATE TIME Na Level 4/23 0603 123 4/23 1134 123   Goal of Therapy:  Na increase <10 mEq/24 hours   Plan:  04/23 @ 2130 Na 122 mEq/L has dropped by 2 will continue to monitor for now and continue 3% saline at 30 ml/hr.  2131, PharmD, BCPS Clinical Pharmacist 12/10/2019 11:42 PM

## 2019-12-10 NOTE — Progress Notes (Addendum)
Speech Language Pathology Treatment: Dysphagia  Patient Details Name: Javier Robinson MRN: [id number] DOB: 1945/12/16 Today's Date: 12/10/2019 Time: 1250-1340 SLP Time Calculation (min) (ACUTE ONLY): 50 min  Assessment / Plan / Recommendation Clinical Impression  Pt seen today for ongoing assessment of swallowing; toleration of diet. NSG reported poor toleration of the Minced foods this morning at breakfast when fed individually, but pt seemed to have more success w/ foods that were more cohesive(minced foods mixed in a Puree). Coughing was noted the minced solids. SLP modified the minced diet to a full Pureed consistency diet w/ NSG to supervise and assist pt w/ at the next meal(Lunch). Meeting w/ NSG, it was reported that pt tolerated the modified diet well w/ no continued coughing issues. Pt was able to even tolerate the Nectar liquids via Cup w/ NSG feeding him.  Pt was seen by this SLP as he finished the Lunch meal. Pt was fed trials of Magic Cup(puree) w/ cup and Pinched straw sips of Nectar liquids in small amounts w/ both -- bolus size appears to primary factor for best bolus management and clearing during swallow as well as in lessening risk for aspiration. Pt consumed several ozs of each w/ no immediate, overt clinical s/s of aspiration noted. Vocal quality was clear b/t trials, no decline in respiratory presentation.  Recommend a modified diet of Dysphagia level 1 (PUREE) w/ NECTAR consistency liquids for safer oral intake and reducing risk for aspiration w/ oral intake; aspiration precautions; Pills Crushed in Puree; feeding assistance and supervision w/ all oral intake. ST services will be available for ongoing education w/ precautions. Aspiration precautions posted at bedside. MD/NSG updated.     HPI HPI: Pt is a 74 y.o. male with past medical history including chronic alcohol abuse and dependence, Malnutrition of moderate degree, CHF, Metabolic acidosis, increased anion gap (IAG), chronic  hyponatremia, who presents to the ED for hyperglycemia.  Last admission was for altered mental status, confusion.  History is limited due to patient's confusion.  Per EMS, patient was noted to be decreasingly alert throughout the day today and when EMS was called, he was found to have a blood glucose in the 20s.  He was given a dose of D10 initially, then required a second dose of D10 for ongoing hyperglycemia.  He reports a chronic cough that is unchanged, denies any fevers, chills, chest pain, shortness of breath, dysuria, abdominal pain, vomiting, or diarrhea. Head CT: "No acute; Mild to moderate atrophy, old left basal ganglia lacunar infarct and chronic ischemic changes".  CXR: "no active disease".  He has a caregiver who checks on him per last admission and uses a walker at home for mobility.       SLP Plan  Continue with current plan of care       Recommendations  Diet recommendations: Dysphagia 1 (puree);Nectar-thick liquid Liquids provided via: Cup;No straw Medication Administration: Crushed with puree(as able for ease of swallowing) Supervision: Patient able to self feed;Intermittent supervision to cue for compensatory strategies Compensations: Minimize environmental distractions;Slow rate;Small sips/bites;Lingual sweep for clearance of pocketing;Multiple dry swallows after each bite/sip;Follow solids with liquid Postural Changes and/or Swallow Maneuvers: Out of bed for meals;Seated upright 90 degrees;Upright 30-60 min after meal                General recommendations: (Dietician f/u) Oral Care Recommendations: Oral care QID;Staff/trained caregiver to provide oral care Follow up Recommendations: Skilled Nursing facility SLP Visit Diagnosis: Dysphagia, oropharyngeal phase (R13.12) Plan: Continue with current plan  of care       GO                 Jerilynn Som, MS, CCC-SLP Mingo Siegert 12/10/2019, 1:57 PM

## 2019-12-10 NOTE — Plan of Care (Addendum)
Just hung 3% hypertonic solution.   Confirmed with Nephrology  - OK to adjust order set orders to Q4 Neuro, rather than established Q1Neuros. Dr. Rosealee Albee d/t the slower rate of infusion and this patient's particular situation - the adjustment could be made - resulting in the ability to remain on THIS unit, rather than transferring to PCU for the needed infusion.  1C director also informed and ok with remaining on the the floor  - Since this change was made and IF nephrology feels safety should not be affected.  Since infusion orders needed to be confirmed prior to hanging - A new sodium draw will be ordered 2hrs after the first, then q4 hrs after that.

## 2019-12-10 NOTE — Progress Notes (Signed)
PROGRESS NOTE    Javier Robinson  BJY:[id number] DOB: April 06, 1946 DOA: 12/06/2019 PCP: [facility]      Assessment & Plan:   Principal Problem:   Hypoglycemia Active Problems:   Hyponatremia   Hypomagnesemia   Hypertensive urgency   Acute encephalopathy   Alcohol dependence (HCC)   Prolonged QT interval   Malnutrition of moderate degree  Acute on chronic hyponatremia: likely secondary to ongoing alcohol use. Labile. Will switch to 3% NaCl & transfer to stepdown as per nephro. Continue on NaCl tabs as per nephro. Nephro recs apprec   Hypertensive urgency: not on any meds. Resolved. Hydralazine prn  Hypokalemia: WNL today. Will continue to monitor   Prolonged QT interval: electrolytes repleted.  Still has prolonged QTC on follow-up EKG. Continue on tele   Alcohol abuse: no signs of withdrawal.  Monitor on CIWA.  Alcohol cessation counseling. Continue thiamine, folate and multivitamin.  Failure to thrive: PT consult. OT recs SNF. Will benefit from SNF.  Abd distention: no ascites on ultrasound abdomen.  Shows heterogeneous echotexture of liver parenchyma suggestive of possible diffuse liver disease on [place]. Likely secondary to alcohol abuse     DVT prophylaxis: lovenox  Code Status: full  Family Communication: Disposition Plan: likely will d/c to SNF.   Status is: Inpatient  Remains inpatient appropriate because:Persistent severe electrolyte disturbances   Dispo: The patient is from: Home              Anticipated d/c is to: SNF              Anticipated d/c date is: 2 days              Patient currently is not medically stable to d/c.      Consultants:   nephro   Procedures:    Antimicrobials:   Subjective: Pt c/o not being able to freeze himself  Objective: Vitals:   12/09/19 0000 12/09/19 0738 12/09/19 1600 12/10/19 0000  BP: (!) 141/66 138/72 (!) 114/48   Pulse:  85 78 90  Resp: 16 13 15    Temp: 98 F (36.7 C)  98.5 F (36.9 C) 98.4 F (36.9 C) 98.3 F (36.8 C)  TempSrc: Oral Axillary Axillary Oral  SpO2:  98% 95%   Weight:      Height:        Intake/Output Summary (Last 24 hours) at 12/10/2019 0731 Last data filed at 12/10/2019 12/12/2019 Gross per 24 hour  Intake 1375.24 ml  Output 1000 ml  Net 375.24 ml   Filed Weights   12/06/19 1432 12/07/19 1326  Weight: 65 kg 71.7 kg    Examination:  General exam: Appears calm & comfortable  Respiratory system: diminished breath sounds b/l. No rales Cardiovascular system: S1 & S2 +. No rubs, gallops or clicks. B/l LE edema Gastrointestinal system: Abdomen is nondistended, soft and nontender. Hypoactive bowel sounds heard. Central nervous system: Awake. Moves all 4 extremities  Psychiatry: Judgement and insight appear abnormal. Flat mood and affect    Data Reviewed: I have personally reviewed following labs and imaging studies  CBC: Recent Labs  Lab 12/06/19 1438 12/06/19 1659 12/07/19 0559 12/09/19 0802 12/10/19 0603  WBC 4.2 4.1 5.1 4.2 4.9  NEUTROABS 1.1*  --   --   --   --   HGB 9.3* 9.6* 9.7* 8.5* 7.7*  HCT 27.8* 28.2* 27.1* 24.8* 22.7*  MCV 90.6 89.2 85.2 88.3 88.7  PLT 276 293 266 242 237   Basic  Metabolic Panel: Recent Labs  Lab 12/06/19 1438 12/06/19 1438 12/06/19 1659 12/06/19 2050 12/07/19 0559 12/07/19 1329 12/08/19 0416 12/08/19 1011 12/08/19 1523 12/09/19 0802 12/10/19 0603  NA 119*   < > 120*   < > 122*   < > 122* 122* 123* 120* 123*  K 3.5   < > 3.6   < > 4.0   < > 4.1 3.8 4.7 3.8 3.6  CL 88*   < > 87*   < > 95*   < > 91* 93* 95* 92* 94*  CO2 21*   < > 23   < > 22   < > 22 23 19* 22 23  GLUCOSE 190*   < > 56*   < > 157*   < > 98 85 84 79 89  BUN 9   < > 8   < > 7*   < > <5* <5* <5* <5* <5*  CREATININE 0.46*   < > 0.36*   < > 0.32*   < > 0.36* 0.34* 0.34* 0.33* 0.35*  CALCIUM 8.3*   < > 8.7*   < > 8.3*   < > 8.7* 8.6* 8.7* 8.3* 8.3*  MG 1.6*  --  2.4  --  1.7  --   --   --   --   --   --   PHOS  --   --   3.6  --   --   --   --   --   --   --   --    < > = values in this interval not displayed.   GFR: Estimated Creatinine Clearance: 76 mL/min (A) (by C-G formula based on SCr of 0.35 mg/dL (L)). Liver Function Tests: Recent Labs  Lab 12/06/19 1438 12/06/19 1659  AST 22 21  ALT 9 10  ALKPHOS 55 59  BILITOT 0.3 0.4  PROT 7.1 7.5  ALBUMIN 3.3* 3.3*   No results for input(s): LIPASE, AMYLASE in the last 168 hours. Recent Labs  Lab 12/06/19 1659  AMMONIA 14   Coagulation Profile: No results for input(s): INR, PROTIME in the last 168 hours. Cardiac Enzymes: No results for input(s): CKTOTAL, CKMB, CKMBINDEX, TROPONINI in the last 168 hours. BNP (last 3 results) No results for input(s): PROBNP in the last 8760 hours. HbA1C: No results for input(s): HGBA1C in the last 72 hours. CBG: Recent Labs  Lab 12/08/19 2105 12/09/19 0802 12/09/19 1330 12/09/19 1655 12/09/19 2154  GLUCAP 87 74 83 121* 95   Lipid Profile: No results for input(s): CHOL, HDL, LDLCALC, TRIG, CHOLHDL, LDLDIRECT in the last 72 hours. Thyroid Function Tests: No results for input(s): TSH, T4TOTAL, FREET4, T3FREE, THYROIDAB in the last 72 hours. Anemia Panel: No results for input(s): VITAMINB12, FOLATE, FERRITIN, TIBC, IRON, RETICCTPCT in the last 72 hours. Sepsis Labs: No results for input(s): PROCALCITON, LATICACIDVEN in the last 168 hours.  Recent Results (from the past 240 hour(s))  SARS CORONAVIRUS 2 (TAT 6-24 HRS) Nasopharyngeal Nasopharyngeal Swab     Status: None   Collection Time: 12/06/19  4:01 PM   Specimen: Nasopharyngeal Swab  Result Value Ref Range Status   SARS Coronavirus 2 NEGATIVE NEGATIVE Final    Comment: (NOTE) SARS-CoV-2 target nucleic acids are NOT DETECTED. The SARS-CoV-2 RNA is generally detectable in upper and lower respiratory specimens during the acute phase of infection. Negative results do not preclude SARS-CoV-2 infection, do not rule out co-infections with other pathogens,  and should not be used as  the sole basis for treatment or other patient management decisions. Negative results must be combined with clinical observations, patient history, and epidemiological information. The expected result is Negative. Fact Sheet for Patients: HairSlick.no Fact Sheet for Healthcare Providers: quierodirigir.com This test is not yet approved or cleared by the [place] FDA and  has been authorized for detection and/or diagnosis of SARS-CoV-2 by FDA under an Emergency Use Authorization (EUA). This EUA will remain  in effect (meaning this test can be used) for the duration of the COVID-19 declaration under Section 56 4(b)(1) of the Act, 21 U.S.C. section 360bbb-3(b)(1), unless the authorization is terminated or revoked sooner. Performed at [facility], [address], [place], [place] [address]          Radiology Studies: No results found.      Scheduled Meds: . amLODipine  5 mg Oral Daily  . enoxaparin (LOVENOX) injection  40 mg Subcutaneous Q24H  . folic acid  1 mg Oral Daily  . multivitamin with minerals  1 tablet Oral Daily  . pantoprazole  40 mg Oral Daily  . sodium chloride  1 g Oral TID WC  . tamsulosin  0.4 mg Oral QPC supper  . thiamine  100 mg Oral Daily   Or  . thiamine  100 mg Intravenous Daily   Continuous Infusions: . dextrose 5 % and 0.9% NaCl 100 mL/hr at 12/10/19 0121     LOS: 4 days    Time spent: 33 mins     Charise Killian, MD [facility] Pager [phone]  If 7PM-7AM, please contact night-coverage [email or web address] 12/10/2019, 7:31 AM

## 2019-12-11 LAB — GLUCOSE, CAPILLARY
Glucose-Capillary: 65 mg/dL — ABNORMAL LOW (ref 70–99)
Glucose-Capillary: 72 mg/dL (ref 70–99)
Glucose-Capillary: 87 mg/dL (ref 70–99)
Glucose-Capillary: 72 mg/dL (ref 70–99)
Glucose-Capillary: 89 mg/dL (ref 70–99)

## 2019-12-11 LAB — BASIC METABOLIC PANEL
Anion gap: 3 — ABNORMAL LOW (ref 5–15)
BUN: 6 mg/dL — ABNORMAL LOW (ref 8–23)
CO2: 25 mmol/L (ref 22–32)
Calcium: 8.3 mg/dL — ABNORMAL LOW (ref 8.9–10.3)
Chloride: 97 mmol/L — ABNORMAL LOW (ref 98–111)
Creatinine, Ser: 0.4 mg/dL — ABNORMAL LOW (ref 0.61–1.24)
GFR calc Af Amer: >60 mL/min (ref >60)
GFR calc non Af Amer: >60 mL/min (ref >60)
Glucose, Bld: 92 mg/dL (ref 70–99)
Potassium: 4 mmol/L (ref 3.5–5.1)
Sodium: 125 mmol/L — ABNORMAL LOW (ref 135–145)

## 2019-12-11 LAB — CBC
HCT: 24.7 % — ABNORMAL LOW (ref 39.0–52.0)
Hemoglobin: 8.2 g/dL — ABNORMAL LOW (ref 13.0–17.0)
MCH: 29.9 pg (ref 26.0–34.0)
MCHC: 33.2 g/dL (ref 30.0–36.0)
MCV: 90.1 fL (ref 80.0–100.0)
Platelets: 225 10*3/uL (ref 150–400)
RBC: 2.74 MIL/uL — ABNORMAL LOW (ref 4.22–5.81)
RDW: 16.9 % — ABNORMAL HIGH (ref 11.5–15.5)
WBC: 4.9 10*3/uL (ref 4.0–10.5)
nRBC: 0 % (ref 0.0–0.2)

## 2019-12-11 LAB — SODIUM
Sodium: 121 mmol/L — ABNORMAL LOW (ref 135–145)
Sodium: 119 mmol/L — CL (ref 135–145)
Sodium: 121 mmol/L — ABNORMAL LOW (ref 135–145)

## 2019-12-11 MED ORDER — GLUCOSE 40 % PO GEL
ORAL | Status: AC
  Filled 2019-12-11: qty 1

## 2019-12-11 NOTE — Consult Note (Signed)
MEDICATION RELATED CONSULT NOTE  Pharmacy Consult for Sodium Monitoring  Indication: 3% sodium chloride continuous infusion for hyponatremia   No Known Allergies  Patient Measurements: Height: 5\' 8"  (172.7 cm) Weight: 71.7 kg (158 lb 1.1 oz) IBW/kg (Calculated) : 68.4  Vital Signs: Temp: 98.9 F (37.2 C) (04/24 0800) Temp Source: Oral (04/24 0800) BP: 159/79 (04/24 0800) Pulse Rate: 93 (04/24 0800) Intake/Output from previous day: 04/23 0701 - 04/24 0700 In: 654.6 [P.O.:240; I.V.:414.6] Out: 900 [Urine:900] Intake/Output from this shift: No intake/output data recorded.  Labs: Recent Labs    12/09/19 0802 12/10/19 0603 12/11/19 0130  WBC 4.2 4.9 4.9  HGB 8.5* 7.7* 8.2*  HCT 24.8* 22.7* 24.7*  PLT 242 237 225  CREATININE 0.33* 0.35* 0.40*   BMP Latest Ref Rng & Units 12/11/2019 12/11/2019 12/10/2019  Glucose 70 - 99 mg/dL - 92 -  BUN 8 - 23 mg/dL - 6(L) -  Creatinine 12/12/2019 - 1.24 mg/dL - 2.87) -  Sodium 8.67(E - 145 mmol/L 121(L) 125(L) 122(L)  Potassium 3.5 - 5.1 mmol/L - 4.0 -  Chloride 98 - 111 mmol/L - 97(L) -  CO2 22 - 32 mmol/L - 25 -  Calcium 8.9 - 10.3 mg/dL - 8.3(L) -    Estimated Creatinine Clearance: 76 mL/min (A) (by C-G formula based on SCr of 0.4 mg/dL (L)).   Medical History: Past Medical History:  Diagnosis Date  . Arthritis   . Pain    BACK. no specific injury   . Seizure (HCC) 2000   IN THE PAST. per patient, it was when he was drinking   Assessment: Pharmacy consulted for Sodium monitoring for 74 yo male admitted with hyponatremia. Patient is being initiated on Hypertonic (3%) saline.   DATE TIME Na Level 4/23 0603 123 4/23 1134 123 4/24  0130  125 4/24 0925 121    Goal of Therapy:  Na increase <10 mEq/24 hours   Plan:  04/24 @ 0925 Na 121 mEq/L trending down-from 3% NACL drip with infiltration and not being on 3% from past 4+ hours per Neph note. Current rate 30 ml/hr To get new IV and f/u  5/24, PharmD,  BCPS Clinical Pharmacist 12/11/2019 2:25 PM

## 2019-12-11 NOTE — Progress Notes (Signed)
Javier Robinson  MRN: [id number]  DOB/AGE: March 03, 1946 74 y.o.  Primary Care Physician:[facility]  Admit date: 12/06/2019  Chief Complaint:  Chief Complaint  Patient presents with  . Hypoglycemia    S-Pt presented on  12/06/2019 with  Chief Complaint  Patient presents with  . Hypoglycemia  . Patient is lying comfortably on the hospital bed.  Patient voices no new concerns  Medications . amLODipine  5 mg Oral Daily  . enoxaparin (LOVENOX) injection  40 mg Subcutaneous Q24H  . folic acid  1 mg Oral Daily  . multivitamin with minerals  1 tablet Oral Daily  . pantoprazole  40 mg Oral Daily  . sodium chloride  1 g Oral TID WC  . tamsulosin  0.4 mg Oral QPC supper  . thiamine  100 mg Oral Daily   Or  . thiamine  100 mg Intravenous Daily         DPO:EUMPN from the symptoms mentioned above,there are no other symptoms referable to all systems reviewed.  Physical Exam: Vital signs in last 24 hours: Temp:  [98.2 F (36.8 C)-99.2 F (37.3 C)] 99.2 F (37.3 C) (04/23 2330) Pulse Rate:  [84-88] 88 (04/23 2330) Resp:  [13-16] 16 (04/23 2330) BP: (136-148)/(66-82) 136/66 (04/23 2330) SpO2:  [100 %] 100 % (04/23 2330) Weight change:     Intake/Output from previous day: 04/23 0701 - 04/24 0700 In: 654.6 [P.O.:240; I.V.:414.6] Out: 900 [Urine:900] No intake/output data recorded.   Physical Exam: General- pt is awake, follows commands Resp- No acute REsp distress, CTA B/L NO Rhonchi CVS- S1S2 regular in rate and rhythm GIT- BS+, soft, NT, ND EXT- NO LE Edema, Cyanosis   Lab Results: CBC Recent Labs    12/10/19 0603 12/11/19 0130  WBC 4.9 4.9  HGB 7.7* 8.2*  HCT 22.7* 24.7*  PLT 237 225    BMET Recent Labs    12/10/19 0603 12/10/19 1134 12/10/19 2138 12/11/19 0130  NA 123*   < > 122* 125*  K 3.6  --   --  4.0  CL 94*  --   --  97*  CO2 23  --   --  25  GLUCOSE 89  --   --  92  BUN <5*  --   --  6*  CREATININE 0.35*  --    --  0.40*  CALCIUM 8.3*  --   --  8.3*   < > = values in this interval not displayed.     Sodium trend 2021 122--now 125 Patient has had 2 admission with hyponatremia this year 2020 122--130-patient had multiple admissions during November and December     MICRO Recent Results (from the past 240 hour(s))  SARS CORONAVIRUS 2 (TAT 6-24 HRS) Nasopharyngeal Nasopharyngeal Swab     Status: None   Collection Time: 12/06/19  4:01 PM   Specimen: Nasopharyngeal Swab  Result Value Ref Range Status   SARS Coronavirus 2 NEGATIVE NEGATIVE Final    Comment: (NOTE) SARS-CoV-2 target nucleic acids are NOT DETECTED. The SARS-CoV-2 RNA is generally detectable in upper and lower respiratory specimens during the acute phase of infection. Negative results do not preclude SARS-CoV-2 infection, do not rule out co-infections with other pathogens, and should not be used as the sole basis for treatment or other patient management decisions. Negative results must be combined with clinical observations, patient history, and epidemiological information. The expected result is Negative. Fact Sheet for Patients: HairSlick.no Fact Sheet for Healthcare Providers: quierodirigir.com  This test is not yet approved or cleared by the [place] and  has been authorized for detection and/or diagnosis of SARS-CoV-2 by FDA under an Emergency Use Authorization (EUA). This EUA will remain  in effect (meaning this test can be used) for the duration of the COVID-19 declaration under Section 56 4(b)(1) of the Act, 21 U.S.C. section 360bbb-3(b)(1), unless the authorization is terminated or revoked sooner. Performed at [facility], [place] [address] [place], [place] [address]       Lab Results  Component Value Date   CALCIUM 8.3 (L) 12/11/2019   CAION 1.08 (L) 02/18/2014   PHOS 3.6 12/06/2019               Impression:  Patient is a  74 year old male with a past medical history of heavy alcohol abuse, back pain, seizure disorder who was admitted to the hospital on December 06, 2019 for evaluation of confusion and altered mental status  1) hyponatremia Chronic hyponatremia-patient has had multiple admissions in the past Patient has hyponatremia secondary to poor solute intake. Data in favor of poor solute intake as patient BUN is negligible Patient creatinine is much lower for a male-low muscle mass. Patient was earlier on p.o. salt tablets but secondary to aspiration patient is now on 3% saline.  Patient sodium was trending in the right direction as patient was on 3% normal saline Patient sodium is dropped from 125 now down to 121 this is secondary to patient having infiltration and not being on 3% from past 4+ hours Patient new IV line has been placed and patient 3% saline will be started soon. Pharmacy is closely following for the trend in the sodium as well  2) alcohol abuse Patient is clinically stable no signs of DTs  3)Anemia of chronic disease  HGb is not at goal (9--11)   4) hypocalcemia Patient corrected calcium is at goal when corrected for low albumin of 3.3  5) hypokalemia Now better    Plan:  We will continue the current treatment plan We will follow up on patient's sodium levels     Javier Robinson s [facility] 12/11/2019, 7:37 AM

## 2019-12-11 NOTE — Consult Note (Signed)
MEDICATION RELATED CONSULT NOTE  Pharmacy Consult for Sodium Monitoring  Indication: 3% sodium chloride continuous infusion for hyponatremia   No Known Allergies  Patient Measurements: Height: 5\' 8"  (172.7 cm) Weight: 71.7 kg (158 lb 1.1 oz) IBW/kg (Calculated) : 68.4  Vital Signs: Temp: 98.6 F (37 C) (04/24 1600) Temp Source: Oral (04/24 1600) BP: 142/62 (04/24 1600) Pulse Rate: 89 (04/24 1600) Intake/Output from previous day: 04/23 0701 - 04/24 0700 In: 654.6 [P.O.:240; I.V.:414.6] Out: 900 [Urine:900] Intake/Output from this shift: No intake/output data recorded.  Labs: Recent Labs    12/09/19 0802 12/10/19 0603 12/11/19 0130  WBC 4.2 4.9 4.9  HGB 8.5* 7.7* 8.2*  HCT 24.8* 22.7* 24.7*  PLT 242 237 225  CREATININE 0.33* 0.35* 0.40*   BMP Latest Ref Rng & Units 12/11/2019 12/11/2019 12/11/2019  Glucose 70 - 99 mg/dL - - 92  BUN 8 - 23 mg/dL - - 6(L)  Creatinine 12/13/2019 - 1.24 mg/dL - - 6.19)  Sodium 5.09(T - 145 mmol/L 119(LL) 121(L) 125(L)  Potassium 3.5 - 5.1 mmol/L - - 4.0  Chloride 98 - 111 mmol/L - - 97(L)  CO2 22 - 32 mmol/L - - 25  Calcium 8.9 - 10.3 mg/dL - - 8.3(L)    Estimated Creatinine Clearance: 76 mL/min (A) (by C-G formula based on SCr of 0.4 mg/dL (L)).   Medical History: Past Medical History:  Diagnosis Date  . Arthritis   . Pain    BACK. no specific injury   . Seizure (HCC) 2000   IN THE PAST. per patient, it was when he was drinking   Assessment: Pharmacy consulted for Sodium monitoring for 74 yo male admitted with hyponatremia. Patient is being initiated on Hypertonic (3%) saline.   DATE TIME Na Level 4/23 0603 123 4/23 1134 123 4/24  0130  125 4/24 0925 121 4/24 1935 119    Goal of Therapy:  Na increase <10 mEq/24 hours   Plan:  04/24 @ 1935 Na 119 mEq/L trending down-Per evening nurse, patient's IV was not running for unknown amount of time prior to level being drawn, was realized after given 1g NaCl PO dose@1852 . Spoke with  NP, will continue current rate of 30 ml/hr will continue to check Q4 hours.   5/24, PharmD Clinical Pharmacist 12/11/2019 8:17 PM

## 2019-12-11 NOTE — Progress Notes (Signed)
PROGRESS NOTE    Javier Robinson  XTK:[id number] DOB: [birth year] DOA: 12/06/2019 PCP: [facility]      Assessment & Plan:   Principal Problem:   Hypoglycemia Active Problems:   Hyponatremia   Hypomagnesemia   Hypertensive urgency   Acute encephalopathy   Alcohol dependence (HCC)   Prolonged QT interval   Malnutrition of moderate degree  Acute on chronic hyponatremia: likely secondary to ongoing alcohol use. Labile. Will continue on 3% NaCl  & NaCl tabs as per nephro.  Nephro recs apprec   Hypertensive urgency: not on any meds. Resolved. Hydralazine prn  Hypokalemia: WNL today. Will continue to monitor   Prolonged QT interval: electrolytes repleted. Continue on tele   Alcohol abuse: no signs of withdrawal.  Monitor on CIWA.  Alcohol cessation counseling. Continue thiamine, folate and multivitamin.  Failure to thrive: PT/OT recs SNF. Will benefit from SNF.  Abd distention: no ascites on ultrasound abdomen.  Shows heterogeneous echotexture of liver parenchyma suggestive of possible diffuse liver disease on [place]. Likely secondary to alcohol abuse     DVT prophylaxis: lovenox  Code Status: full  Family Communication: Disposition Plan: likely will d/c to SNF.   Status is: Inpatient  Remains inpatient appropriate because:Persistent severe electrolyte disturbances   Dispo: The patient is from: Home              Anticipated d/c is to: SNF              Anticipated d/c date is: 2 days              Patient currently is not medically stable to d/c.      Consultants:   nephro   Procedures:    Antimicrobials:   Subjective: Pt c/o fatigue  Objective: Vitals:   12/10/19 1131 12/10/19 1400 12/10/19 2330 12/11/19 0800  BP: 138/80 140/82 136/66 (!) 159/79  Pulse:  85 88 93  Resp:   16 18  Temp:  98.2 F (36.8 C) 99.2 F (37.3 C) 98.9 F (37.2 C)  TempSrc:  Oral Oral Oral  SpO2:   100% 98%  Weight:      Height:         Intake/Output Summary (Last 24 hours) at 12/11/2019 1145 Last data filed at 12/11/2019 0300 Gross per 24 hour  Intake 654.6 ml  Output 650 ml  Net 4.6 ml   Filed Weights   12/06/19 1432 12/07/19 1326  Weight: 65 kg 71.7 kg    Examination:  General exam: Appears calm & comfortable  Respiratory system: decreased breath sounds b/l. No wheezes Cardiovascular system: S1 & S2 +. No rubs, gallops or clicks. B/l LE edema Gastrointestinal system: Abdomen is nondistended, soft and nontender. normal bowel sounds heard. Central nervous system: Awake. Moves all 4 extremities  Psychiatry: Judgement and insight appear normal. Flat mood and affect    Data Reviewed: I have personally reviewed following labs and imaging studies  CBC: Recent Labs  Lab 12/06/19 1438 12/06/19 1438 12/06/19 1659 12/07/19 0559 12/09/19 0802 12/10/19 0603 12/11/19 0130  WBC 4.2   < > 4.1 5.1 4.2 4.9 4.9  NEUTROABS 1.1*  --   --   --   --   --   --   HGB 9.3*   < > 9.6* 9.7* 8.5* 7.7* 8.2*  HCT 27.8*   < > 28.2* 27.1* 24.8* 22.7* 24.7*  MCV 90.6   < > 89.2 85.2 88.3 88.7 90.1  PLT 276   < >  293 266 242 237 225   < > = values in this interval not displayed.   Basic Metabolic Panel: Recent Labs  Lab 12/06/19 1438 12/06/19 1438 12/06/19 1659 12/06/19 2050 12/07/19 0559 12/07/19 1329 12/08/19 1011 12/08/19 1011 12/08/19 1523 12/08/19 1523 12/09/19 0802 12/09/19 0802 12/10/19 0603 12/10/19 1134 12/10/19 1450 12/10/19 1706 12/10/19 2138 12/11/19 0130 12/11/19 0925  NA 119*   < > 120*   < > 122*   < > 122*   < > 123*   < > 120*   < > 123*   < > 123* 124* 122* 125* 121*  K 3.5   < > 3.6   < > 4.0   < > 3.8  --  4.7  --  3.8  --  3.6  --   --   --   --  4.0  --   CL 88*   < > 87*   < > 95*   < > 93*  --  95*  --  92*  --  94*  --   --   --   --  97*  --   CO2 21*   < > 23   < > 22   < > 23  --  19*  --  22  --  23  --   --   --   --  25  --   GLUCOSE 190*   < > 56*   < > 157*   < > 85  --  84   --  79  --  89  --   --   --   --  92  --   BUN 9   < > 8   < > 7*   < > <5*  --  <5*  --  <5*  --  <5*  --   --   --   --  6*  --   CREATININE 0.46*   < > 0.36*   < > 0.32*   < > 0.34*  --  0.34*  --  0.33*  --  0.35*  --   --   --   --  0.40*  --   CALCIUM 8.3*   < > 8.7*   < > 8.3*   < > 8.6*  --  8.7*  --  8.3*  --  8.3*  --   --   --   --  8.3*  --   MG 1.6*  --  2.4  --  1.7  --   --   --   --   --   --   --   --   --   --   --   --   --   --   PHOS  --   --  3.6  --   --   --   --   --   --   --   --   --   --   --   --   --   --   --   --    < > = values in this interval not displayed.   GFR: Estimated Creatinine Clearance: 76 mL/min (A) (by C-G formula based on SCr of 0.4 mg/dL (L)). Liver Function Tests: Recent Labs  Lab 12/06/19 1438 12/06/19 1659  AST 22 21  ALT 9 10  ALKPHOS 55 59  BILITOT 0.3 0.4  PROT 7.1 7.5  ALBUMIN 3.3* 3.3*   No results for input(s): LIPASE, AMYLASE in the last 168 hours. Recent Labs  Lab 12/06/19 1659  AMMONIA 14   Coagulation Profile: No results for input(s): INR, PROTIME in the last 168 hours. Cardiac Enzymes: No results for input(s): CKTOTAL, CKMB, CKMBINDEX, TROPONINI in the last 168 hours. BNP (last 3 results) No results for input(s): PROBNP in the last 8760 hours. HbA1C: No results for input(s): HGBA1C in the last 72 hours. CBG: Recent Labs  Lab 12/10/19 1202 12/10/19 1622 12/10/19 2107 12/11/19 0828 12/11/19 0902  GLUCAP 89 98 87 65* 72   Lipid Profile: No results for input(s): CHOL, HDL, LDLCALC, TRIG, CHOLHDL, LDLDIRECT in the last 72 hours. Thyroid Function Tests: No results for input(s): TSH, T4TOTAL, FREET4, T3FREE, THYROIDAB in the last 72 hours. Anemia Panel: No results for input(s): VITAMINB12, FOLATE, FERRITIN, TIBC, IRON, RETICCTPCT in the last 72 hours. Sepsis Labs: No results for input(s): PROCALCITON, LATICACIDVEN in the last 168 hours.  Recent Results (from the past 240 hour(s))  SARS CORONAVIRUS 2 (TAT  6-24 HRS) Nasopharyngeal Nasopharyngeal Swab     Status: None   Collection Time: 12/06/19  4:01 PM   Specimen: Nasopharyngeal Swab  Result Value Ref Range Status   SARS Coronavirus 2 NEGATIVE NEGATIVE Final    Comment: (NOTE) SARS-CoV-2 target nucleic acids are NOT DETECTED. The SARS-CoV-2 RNA is generally detectable in upper and lower respiratory specimens during the acute phase of infection. Negative results do not preclude SARS-CoV-2 infection, do not rule out co-infections with other pathogens, and should not be used as the sole basis for treatment or other patient management decisions. Negative results must be combined with clinical observations, patient history, and epidemiological information. The expected result is Negative. Fact Sheet for Patients: HairSlick.no Fact Sheet for Healthcare Providers: quierodirigir.com This test is not yet approved or cleared by the [place] FDA and  has been authorized for detection and/or diagnosis of SARS-CoV-2 by FDA under an Emergency Use Authorization (EUA). This EUA will remain  in effect (meaning this test can be used) for the duration of the COVID-19 declaration under Section 56 4(b)(1) of the Act, 21 U.S.C. section 360bbb-3(b)(1), unless the authorization is terminated or revoked sooner. Performed at [facility], [address], [place], [place] [address]          Radiology Studies: No results found.      Scheduled Meds: . dextrose      . amLODipine  5 mg Oral Daily  . enoxaparin (LOVENOX) injection  40 mg Subcutaneous Q24H  . folic acid  1 mg Oral Daily  . multivitamin with minerals  1 tablet Oral Daily  . pantoprazole  40 mg Oral Daily  . sodium chloride  1 g Oral TID WC  . tamsulosin  0.4 mg Oral QPC supper  . thiamine  100 mg Oral Daily   Or  . thiamine  100 mg Intravenous Daily   Continuous Infusions: . sodium chloride (hypertonic) 30 mL/hr  (12/10/19 1309)     LOS: 5 days    Time spent: 30 mins     Charise Killian, MD [facility] Pager [phone]  If 7PM-7AM, please contact night-coverage [email or web address] 12/11/2019, 11:45 AM

## 2019-12-11 NOTE — Consult Note (Signed)
MEDICATION RELATED CONSULT NOTE - INITIAL   Pharmacy Consult for Sodium Monitoring  Indication: 3% sodium chloride continuous infusion for hyponatremia   No Known Allergies  Patient Measurements: Height: 5\' 8"  (172.7 cm) Weight: 71.7 kg (158 lb 1.1 oz) IBW/kg (Calculated) : 68.4  Vital Signs: Temp: 99.2 F (37.3 C) (04/23 2330) Temp Source: Oral (04/23 2330) BP: 136/66 (04/23 2330) Pulse Rate: 88 (04/23 2330) Intake/Output from previous day: 04/23 0701 - 04/24 0700 In: 357.9 [P.O.:240; I.V.:117.9] Out: 900 [Urine:900] Intake/Output from this shift: Total I/O In: -  Out: 650 [Urine:650]  Labs: Recent Labs    12/09/19 0802 12/10/19 0603 12/11/19 0130  WBC 4.2 4.9 4.9  HGB 8.5* 7.7* 8.2*  HCT 24.8* 22.7* 24.7*  PLT 242 237 225  CREATININE 0.33* 0.35* 0.40*   BMP Latest Ref Rng & Units 12/11/2019 12/10/2019 12/10/2019  Glucose 70 - 99 mg/dL 92 - -  BUN 8 - 23 mg/dL 6(L) - -  Creatinine 12/12/2019 - 1.24 mg/dL 8.50) - -  Sodium 2.77(A - 145 mmol/L 125(L) 122(L) 124(L)  Potassium 3.5 - 5.1 mmol/L 4.0 - -  Chloride 98 - 111 mmol/L 97(L) - -  CO2 22 - 32 mmol/L 25 - -  Calcium 8.9 - 10.3 mg/dL 8.3(L) - -    Estimated Creatinine Clearance: 76 mL/min (A) (by C-G formula based on SCr of 0.4 mg/dL (L)).   Medical History: Past Medical History:  Diagnosis Date  . Arthritis   . Pain    BACK. no specific injury   . Seizure (HCC) 2000   IN THE PAST. per patient, it was when he was drinking   Assessment: Pharmacy consulted for Sodium monitoring for 74 yo male admitted with hyponatremia. Patient is being initiated on Hypertonic (3%) saline.   DATE TIME Na Level 4/23 0603 123 4/23 1134 123   Goal of Therapy:  Na increase <10 mEq/24 hours   Plan:  04/24 @ 0130 Na 125 mEq/L trending appropriately, will continue to monitor.  5/24, PharmD, BCPS Clinical Pharmacist 12/11/2019 2:37 AM

## 2019-12-11 NOTE — Progress Notes (Signed)
NP Jon Billings notified of Na 119-waiting on response

## 2019-12-12 LAB — SODIUM
Sodium: 123 mmol/L — ABNORMAL LOW (ref 135–145)
Sodium: 122 mmol/L — ABNORMAL LOW (ref 135–145)
Sodium: 120 mmol/L — ABNORMAL LOW (ref 135–145)
Sodium: 123 mmol/L — ABNORMAL LOW (ref 135–145)
Sodium: 125 mmol/L — ABNORMAL LOW (ref 135–145)
Sodium: 125 mmol/L — ABNORMAL LOW (ref 135–145)
Sodium: 127 mmol/L — ABNORMAL LOW (ref 135–145)

## 2019-12-12 LAB — BASIC METABOLIC PANEL
Anion gap: 7 (ref 5–15)
BUN: <5 mg/dL — ABNORMAL LOW (ref 8–23)
CO2: 23 mmol/L (ref 22–32)
Calcium: 8.3 mg/dL — ABNORMAL LOW (ref 8.9–10.3)
Chloride: 92 mmol/L — ABNORMAL LOW (ref 98–111)
Creatinine, Ser: 0.38 mg/dL — ABNORMAL LOW (ref 0.61–1.24)
GFR calc Af Amer: >60 mL/min (ref >60)
GFR calc non Af Amer: >60 mL/min (ref >60)
Glucose, Bld: 80 mg/dL (ref 70–99)
Potassium: 3.5 mmol/L (ref 3.5–5.1)
Sodium: 122 mmol/L — ABNORMAL LOW (ref 135–145)

## 2019-12-12 LAB — CBC
HCT: 23.8 % — ABNORMAL LOW (ref 39.0–52.0)
Hemoglobin: 8.5 g/dL — ABNORMAL LOW (ref 13.0–17.0)
MCH: 30.6 pg (ref 26.0–34.0)
MCHC: 35.7 g/dL (ref 30.0–36.0)
MCV: 85.6 fL (ref 80.0–100.0)
Platelets: 246 10*3/uL (ref 150–400)
RBC: 2.78 MIL/uL — ABNORMAL LOW (ref 4.22–5.81)
RDW: 16.4 % — ABNORMAL HIGH (ref 11.5–15.5)
WBC: 4.2 10*3/uL (ref 4.0–10.5)
nRBC: 0 % (ref 0.0–0.2)

## 2019-12-12 LAB — GLUCOSE, CAPILLARY
Glucose-Capillary: 67 mg/dL — ABNORMAL LOW (ref 70–99)
Glucose-Capillary: 101 mg/dL — ABNORMAL HIGH (ref 70–99)
Glucose-Capillary: 83 mg/dL (ref 70–99)
Glucose-Capillary: 112 mg/dL — ABNORMAL HIGH (ref 70–99)
Glucose-Capillary: 118 mg/dL — ABNORMAL HIGH (ref 70–99)

## 2019-12-12 LAB — OSMOLALITY: Osmolality: 250 mOsm/kg — ABNORMAL LOW (ref 275–295)

## 2019-12-12 MED ORDER — SODIUM CHLORIDE 3 % IV SOLN
INTRAVENOUS | Status: DC
  Administered 2019-12-12 (×2): 35 mL/h via INTRAVENOUS
  Filled 2019-12-12 (×2): qty 500

## 2019-12-12 MED ORDER — SODIUM CHLORIDE 3 % IV SOLN
INTRAVENOUS | Status: DC
  Filled 2019-12-12: qty 500

## 2019-12-12 MED ORDER — ENSURE ENLIVE PO LIQD
237.0000 mL | Freq: Three times a day (TID) | ORAL | Status: DC
  Administered 2019-12-12 – 2019-12-16 (×12): 237 mL via ORAL

## 2019-12-12 MED ORDER — SODIUM CHLORIDE 3 % IV SOLN
INTRAVENOUS | Status: DC
  Administered 2019-12-12 – 2019-12-13 (×2): 35 mL/h via INTRAVENOUS
  Filled 2019-12-12 (×4): qty 500

## 2019-12-12 NOTE — Consult Note (Signed)
MEDICATION RELATED CONSULT NOTE  Pharmacy Consult for Sodium Monitoring  Indication: 3% sodium chloride continuous infusion for hyponatremia   No Known Allergies  Patient Measurements: Height: 5\' 8"  (172.7 cm) Weight: 71.7 kg (158 lb 1.1 oz) IBW/kg (Calculated) : 68.4  Vital Signs: Temp: 98.6 F (37 C) (04/24 2341) Temp Source: Oral (04/24 2341) BP: 142/73 (04/24 2341) Pulse Rate: 86 (04/24 2341) Intake/Output from previous day: No intake/output data recorded. Intake/Output from this shift: No intake/output data recorded.  Labs: Recent Labs    12/09/19 0802 12/09/19 0802 12/10/19 0603 12/11/19 0130 12/12/19 0208  WBC 4.2   < > 4.9 4.9 4.2  HGB 8.5*   < > 7.7* 8.2* 8.5*  HCT 24.8*   < > 22.7* 24.7* 23.8*  PLT 242   < > 237 225 246  CREATININE 0.33*  --  0.35* 0.40*  --    < > = values in this interval not displayed.   BMP Latest Ref Rng & Units 12/12/2019 12/11/2019 12/11/2019  Glucose 70 - 99 mg/dL - - -  BUN 8 - 23 mg/dL - - -  Creatinine 12/13/2019 - 1.24 mg/dL - - -  Sodium 7.11 - 657 mmol/L 123(L) 121(L) 119(LL)  Potassium 3.5 - 5.1 mmol/L - - -  Chloride 98 - 111 mmol/L - - -  CO2 22 - 32 mmol/L - - -  Calcium 8.9 - 10.3 mg/dL - - -    Estimated Creatinine Clearance: 76 mL/min (A) (by C-G formula based on SCr of 0.4 mg/dL (L)).   Medical History: Past Medical History:  Diagnosis Date  . Arthritis   . Pain    BACK. no specific injury   . Seizure (HCC) 2000   IN THE PAST. per patient, it was when he was drinking   Assessment: Pharmacy consulted for Sodium monitoring for 74 yo male admitted with hyponatremia. Patient is being initiated on Hypertonic (3%) saline.   DATE TIME Na Level 4/23 0603 123 4/23 1134 123 4/24  0130  125 4/24 0925 121 4/24 1935 119    Goal of Therapy:  Na increase <10 mEq/24 hours   Plan:  04/25 @ 0000 Na 123 rate currently running at 30 ml/hr, NP wanted to keep it at 30 ml/hr for a bit longer, currently trending up  appropriately will continue to monitor.  5/25, PharmD Clinical Pharmacist 12/12/2019 2:59 AM

## 2019-12-12 NOTE — Consult Note (Signed)
MEDICATION RELATED CONSULT NOTE  Pharmacy Consult for Sodium Monitoring  Indication: 3% sodium chloride continuous infusion for hyponatremia   No Known Allergies  Patient Measurements: Height: 5\' 8"  (172.7 cm) Weight: 71.7 kg (158 lb 1.1 oz) IBW/kg (Calculated) : 68.4  Vital Signs: Temp: 98.1 F (36.7 C) (04/25 1600) Temp Source: Oral (04/25 1600) BP: 124/62 (04/25 1600) Pulse Rate: 69 (04/25 1600) Intake/Output from previous day: 04/24 0701 - 04/25 0700 In: 595.4 [I.V.:595.4] Out: 1000 [Urine:1000] Intake/Output from this shift: Total I/O In: 407.7 [P.O.:240; I.V.:167.7] Out: -   Labs: Recent Labs    12/10/19 0603 12/11/19 0130 12/12/19 0208  WBC 4.9 4.9 4.2  HGB 7.7* 8.2* 8.5*  HCT 22.7* 24.7* 23.8*  PLT 237 225 246  CREATININE 0.35* 0.40* 0.38*   BMP Latest Ref Rng & Units 12/12/2019 12/12/2019 12/12/2019  Glucose 70 - 99 mg/dL - - -  BUN 8 - 23 mg/dL - - -  Creatinine 12/14/2019 - 1.24 mg/dL - - -  Sodium 3.22 - 025 mmol/L 125(L) 123(L) 120(L)  Potassium 3.5 - 5.1 mmol/L - - -  Chloride 98 - 111 mmol/L - - -  CO2 22 - 32 mmol/L - - -  Calcium 8.9 - 10.3 mg/dL - - -    Estimated Creatinine Clearance: 76 mL/min (A) (by C-G formula based on SCr of 0.38 mg/dL (L)).   Medical History: Past Medical History:  Diagnosis Date  . Arthritis   . Pain    BACK. no specific injury   . Seizure (HCC) 2000   IN THE PAST. per patient, it was when he was drinking   Assessment: Pharmacy consulted for Sodium monitoring for 74 yo male admitted with hyponatremia. Patient is being initiated on Hypertonic (3%) saline.   DATE TIME Na Level 4/23 0603 123 4/23 1134 123 4/24  0130  125 4/24 0925 121 4/24 1935 119 4/25 0530 122 4/25 0802 120 4/25 1353 123    Goal of Therapy:  Na increase <10 mEq/24 hours   Plan:  04/25 @ 1715 Na 125 rate currently running at 35 ml/hr, will continue at this current rate and Na level currently ordered q3h per MD  5/25,  PharmD Clinical Pharmacist 12/12/2019 6:22 PM

## 2019-12-12 NOTE — Consult Note (Signed)
MEDICATION RELATED CONSULT NOTE  Pharmacy Consult for Sodium Monitoring  Indication: 3% sodium chloride continuous infusion for hyponatremia   No Known Allergies  Patient Measurements: Height: 5\' 8"  (172.7 cm) Weight: 71.7 kg (158 lb 1.1 oz) IBW/kg (Calculated) : 68.4  Vital Signs: Temp: 98.1 F (36.7 C) (04/25 1600) Temp Source: Oral (04/25 1600) BP: 124/62 (04/25 1600) Pulse Rate: 69 (04/25 1600) Intake/Output from previous day: 04/24 0701 - 04/25 0700 In: 595.4 [I.V.:595.4] Out: 1000 [Urine:1000] Intake/Output from this shift: No intake/output data recorded.  Labs: Recent Labs    12/10/19 0603 12/11/19 0130 12/12/19 0208  WBC 4.9 4.9 4.2  HGB 7.7* 8.2* 8.5*  HCT 22.7* 24.7* 23.8*  PLT 237 225 246  CREATININE 0.35* 0.40* 0.38*   BMP Latest Ref Rng & Units 12/12/2019 12/12/2019 12/12/2019  Glucose 70 - 99 mg/dL - - -  BUN 8 - 23 mg/dL - - -  Creatinine 12/14/2019 - 1.24 mg/dL - - -  Sodium 9.50 - 932 mmol/L 125(L) 125(L) 123(L)  Potassium 3.5 - 5.1 mmol/L - - -  Chloride 98 - 111 mmol/L - - -  CO2 22 - 32 mmol/L - - -  Calcium 8.9 - 10.3 mg/dL - - -    Estimated Creatinine Clearance: 76 mL/min (A) (by C-G formula based on SCr of 0.38 mg/dL (L)).   Medical History: Past Medical History:  Diagnosis Date  . Arthritis   . Pain    BACK. no specific injury   . Seizure (HCC) 2000   IN THE PAST. per patient, it was when he was drinking   Assessment: Pharmacy consulted for Sodium monitoring for 74 yo male admitted with hyponatremia. Patient is being initiated on Hypertonic (3%) saline.   DATE TIME Na Level 4/23 0603 123 4/23 1134 123 4/24  0130  125 4/24 0925 121 4/24 1935 119 4/25 0530 122 4/25 0802 120 4/25 1353 123 4/25 1715  125   Goal of Therapy:  Na increase <10 mEq/24 hours   Plan:  04/25 @ 1947 Na 125 rate currently running at 35 ml/hr, will continue at this current rate and Na level currently ordered q3h per MD  5/25, PharmD Clinical  Pharmacist 12/12/2019 8:19 PM

## 2019-12-12 NOTE — Consult Note (Addendum)
MEDICATION RELATED CONSULT NOTE  Pharmacy Consult for Sodium Monitoring  Indication: 3% sodium chloride continuous infusion for hyponatremia   No Known Allergies  Patient Measurements: Height: 5\' 8"  (172.7 cm) Weight: 71.7 kg (158 lb 1.1 oz) IBW/kg (Calculated) : 68.4  Vital Signs: Temp: 98.6 F (37 C) (04/24 2341) Temp Source: Oral (04/24 2341) BP: 142/73 (04/24 2341) Pulse Rate: 86 (04/24 2341) Intake/Output from previous day: No intake/output data recorded. Intake/Output from this shift: No intake/output data recorded.  Labs: Recent Labs    12/09/19 0802 12/10/19 0603 12/11/19 0130  WBC 4.2 4.9 4.9  HGB 8.5* 7.7* 8.2*  HCT 24.8* 22.7* 24.7*  PLT 242 237 225  CREATININE 0.33* 0.35* 0.40*   BMP Latest Ref Rng & Units 12/11/2019 12/11/2019 12/11/2019  Glucose 70 - 99 mg/dL - - -  BUN 8 - 23 mg/dL - - -  Creatinine 12/13/2019 - 1.24 mg/dL - - -  Sodium 9.16 - 945 mmol/L 121(L) 119(LL) 121(L)  Potassium 3.5 - 5.1 mmol/L - - -  Chloride 98 - 111 mmol/L - - -  CO2 22 - 32 mmol/L - - -  Calcium 8.9 - 10.3 mg/dL - - -    Estimated Creatinine Clearance: 76 mL/min (A) (by C-G formula based on SCr of 0.4 mg/dL (L)).   Medical History: Past Medical History:  Diagnosis Date  . Arthritis   . Pain    BACK. no specific injury   . Seizure (HCC) 2000   IN THE PAST. per patient, it was when he was drinking   Assessment: Pharmacy consulted for Sodium monitoring for 74 yo male admitted with hyponatremia. Patient is being initiated on Hypertonic (3%) saline.   DATE TIME Na Level 4/23 0603 123 4/23 1134 123 4/24  0130  125 4/24 0925 121 4/24 1935 119    Goal of Therapy:  Na increase <10 mEq/24 hours   Plan:  04/25 @ 0000 Na 121 spoke to NP will increase rate to 35 ml/hr and continue to monitor.  5/25, PharmD Clinical Pharmacist 12/12/2019 1:12 AM

## 2019-12-12 NOTE — Consult Note (Signed)
MEDICATION RELATED CONSULT NOTE  Pharmacy Consult for Sodium Monitoring  Indication: 3% sodium chloride continuous infusion for hyponatremia   No Known Allergies  Patient Measurements: Height: 5\' 8"  (172.7 cm) Weight: 71.7 kg (158 lb 1.1 oz) IBW/kg (Calculated) : 68.4  Vital Signs: Temp: 98.6 F (37 C) (04/24 2341) Temp Source: Oral (04/24 2341) BP: 142/73 (04/24 2341) Pulse Rate: 86 (04/24 2341) Intake/Output from previous day: 04/24 0701 - 04/25 0700 In: 595.4 [I.V.:595.4] Out: 1000 [Urine:1000] Intake/Output from this shift: Total I/O In: 595.4 [I.V.:595.4] Out: 1000 [Urine:1000]  Labs: Recent Labs    12/10/19 0603 12/11/19 0130 12/12/19 0208  WBC 4.9 4.9 4.2  HGB 7.7* 8.2* 8.5*  HCT 22.7* 24.7* 23.8*  PLT 237 225 246  CREATININE 0.35* 0.40* 0.38*   BMP Latest Ref Rng & Units 12/12/2019 12/12/2019 12/12/2019  Glucose 70 - 99 mg/dL - 80 -  BUN 8 - 23 mg/dL - 12/14/2019) -  Creatinine <9(J - 1.24 mg/dL - 4.78) -  Sodium 2.95(A - 145 mmol/L 122(L) 122(L) 123(L)  Potassium 3.5 - 5.1 mmol/L - 3.5 -  Chloride 98 - 111 mmol/L - 92(L) -  CO2 22 - 32 mmol/L - 23 -  Calcium 8.9 - 10.3 mg/dL - 8.3(L) -    Estimated Creatinine Clearance: 76 mL/min (A) (by C-G formula based on SCr of 0.38 mg/dL (L)).   Medical History: Past Medical History:  Diagnosis Date  . Arthritis   . Pain    BACK. no specific injury   . Seizure (HCC) 2000   IN THE PAST. per patient, it was when he was drinking   Assessment: Pharmacy consulted for Sodium monitoring for 74 yo male admitted with hyponatremia. Patient is being initiated on Hypertonic (3%) saline.   DATE TIME Na Level 4/23 0603 123 4/23 1134 123 4/24  0130  125 4/24 0925 121 4/24 1935 119    Goal of Therapy:  Na increase <10 mEq/24 hours   Plan:  04/25 @ 0530 Na 122 rate currently running at 35 ml/hr, will continue at this current rate and will continue to monitor w/ q4h Na checks.  5/25, PharmD Clinical  Pharmacist 12/12/2019 6:58 AM

## 2019-12-12 NOTE — Consult Note (Signed)
MEDICATION RELATED CONSULT NOTE  Pharmacy Consult for Sodium Monitoring  Indication: 3% sodium chloride continuous infusion for hyponatremia   No Known Allergies  Patient Measurements: Height: 5\' 8"  (172.7 cm) Weight: 71.7 kg (158 lb 1.1 oz) IBW/kg (Calculated) : 68.4  Vital Signs: Temp: 98.7 F (37.1 C) (04/25 1010) Temp Source: Oral (04/25 1010) BP: 131/60 (04/25 1010) Pulse Rate: 74 (04/25 1010) Intake/Output from previous day: 04/24 0701 - 04/25 0700 In: 595.4 [I.V.:595.4] Out: 1000 [Urine:1000] Intake/Output from this shift: Total I/O In: 240 [P.O.:240] Out: -   Labs: Recent Labs    12/10/19 0603 12/11/19 0130 12/12/19 0208  WBC 4.9 4.9 4.2  HGB 7.7* 8.2* 8.5*  HCT 22.7* 24.7* 23.8*  PLT 237 225 246  CREATININE 0.35* 0.40* 0.38*   BMP Latest Ref Rng & Units 12/12/2019 12/12/2019 12/12/2019  Glucose 70 - 99 mg/dL - - 80  BUN 8 - 23 mg/dL - - 12/14/2019)  Creatinine <6(Y - 1.24 mg/dL - - 4.03)  Sodium 4.74(Q - 145 mmol/L 120(L) 122(L) 122(L)  Potassium 3.5 - 5.1 mmol/L - - 3.5  Chloride 98 - 111 mmol/L - - 92(L)  CO2 22 - 32 mmol/L - - 23  Calcium 8.9 - 10.3 mg/dL - - 8.3(L)    Estimated Creatinine Clearance: 76 mL/min (A) (by C-G formula based on SCr of 0.38 mg/dL (L)).   Medical History: Past Medical History:  Diagnosis Date  . Arthritis   . Pain    BACK. no specific injury   . Seizure (HCC) 2000   IN THE PAST. per patient, it was when he was drinking   Assessment: Pharmacy consulted for Sodium monitoring for 74 yo male admitted with hyponatremia. Patient is being initiated on Hypertonic (3%) saline.   DATE TIME Na Level 4/23 0603 123 4/23 1134 123 4/24  0130  125 4/24 0925 121 4/24 1935 119 4/25 0530 122 4/25 0802 120    Goal of Therapy:  Na increase <10 mEq/24 hours   Plan:  04/25 @ 0530 Na 120 rate currently running at 35 ml/hr, will continue at this current rate and Na level currently ordered q3h  5/25, PharmD Clinical  Pharmacist 12/12/2019 11:01 AM

## 2019-12-12 NOTE — Progress Notes (Addendum)
Javier Robinson  MRN: [id number]  DOB/AGE: August 15, 1946 74 y.o.  Primary Care Physician:[facility]  Admit date: 12/06/2019  Chief Complaint:  Chief Complaint  Patient presents with  . Hypoglycemia    S-Pt presented on  12/06/2019 with  Chief Complaint  Patient presents with  . Hypoglycemia  . Patient is lying comfortably on the hospital bed.  Patient voices no new concerns  Medications . amLODipine  5 mg Oral Daily  . enoxaparin (LOVENOX) injection  40 mg Subcutaneous Q24H  . feeding supplement (ENSURE ENLIVE)  237 mL Oral TID  . folic acid  1 mg Oral Daily  . multivitamin with minerals  1 tablet Oral Daily  . pantoprazole  40 mg Oral Daily  . sodium chloride  1 g Oral TID WC  . tamsulosin  0.4 mg Oral QPC supper  . thiamine  100 mg Oral Daily   Or  . thiamine  100 mg Intravenous Daily         SVX:[id number] from the symptoms mentioned above,there are no other symptoms referable to all systems reviewed.  Physical Exam: Vital signs in last 24 hours: Temp:  [98.6 F (37 C)-98.7 F (37.1 C)] 98.7 F (37.1 C) (04/25 1010) Pulse Rate:  [74-89] 74 (04/25 1010) Resp:  [12-19] 12 (04/25 1010) BP: (131-142)/(60-73) 131/60 (04/25 1010) SpO2:  [98 %-100 %] 98 % (04/25 1010) Weight change:  Last BM Date: 12/11/19  Intake/Output from previous day: 04/24 0701 - 04/25 0700 In: 595.4 [I.V.:595.4] Out: 1000 [Urine:1000] Total I/O In: 240 [P.O.:240] Out: -    Physical Exam: General- pt is awake, follows commands Resp- No acute REsp distress, CTA B/L NO Rhonchi CVS- S1S2 regular in rate and rhythm GIT- BS+, soft, NT, ND EXT- NO LE Edema, Cyanosis   Lab Results: CBC Recent Labs    12/11/19 0130 12/12/19 0208  WBC 4.9 4.2  HGB 8.2* 8.5*  HCT 24.7* 23.8*  PLT 225 246    BMET Recent Labs    12/11/19 0130 12/11/19 0925 12/12/19 0208 12/12/19 0208 12/12/19 0530 12/12/19 0802  NA 125*   < > 122*  123*   < > 122* 120*  K 4.0  --  3.5   --   --   --   CL 97*  --  92*  --   --   --   CO2 25  --  23  --   --   --   GLUCOSE 92  --  80  --   --   --   BUN 6*  --  <5*  --   --   --   CREATININE 0.40*  --  0.38*  --   --   --   CALCIUM 8.3*  --  8.3*  --   --   --    < > = values in this interval not displayed.     Sodium trend 2021 122-- 125 Patient has had 2 admission with hyponatremia this year 2020 122--130-patient had multiple admissions during November and December     MICRO Recent Results (from the past 240 hour(s))  SARS CORONAVIRUS 2 (TAT 6-24 HRS) Nasopharyngeal Nasopharyngeal Swab     Status: None   Collection Time: 12/06/19  4:01 PM   Specimen: Nasopharyngeal Swab  Result Value Ref Range Status   SARS Coronavirus 2 NEGATIVE NEGATIVE Final    Comment: (NOTE) SARS-CoV-2 target nucleic acids are NOT DETECTED. The SARS-CoV-2 RNA is generally detectable in upper  and lower respiratory specimens during the acute phase of infection. Negative results do not preclude SARS-CoV-2 infection, do not rule out co-infections with other pathogens, and should not be used as the sole basis for treatment or other patient management decisions. Negative results must be combined with clinical observations, patient history, and epidemiological information. The expected result is Negative. Fact Sheet for Patients: SugarRoll.be Fact Sheet for Healthcare Providers: https://www.woods-mathews.com/ This test is not yet approved or cleared by the [place] FDA and  has been authorized for detection and/or diagnosis of SARS-CoV-2 by FDA under an Emergency Use Authorization (EUA). This EUA will remain  in effect (meaning this test can be used) for the duration of the COVID-19 declaration under Section 56 4(b)(1) of the Act, 21 U.S.C. section 360bbb-3(b)(1), unless the authorization is terminated or revoked sooner. Performed at [facility], [place] [address], [place],  [place] [address]       Lab Results  Component Value Date   CALCIUM 8.3 (L) 12/12/2019   CAION 1.08 (L) 02/18/2014   PHOS 3.6 12/06/2019               Impression:  Patient is a 74 year old male with a past medical history of heavy alcohol abuse, back pain, seizure disorder who was admitted to the hospital on December 06, 2019 for evaluation of confusion and altered mental status  1) hyponatremia Chronic hyponatremia-patient has had multiple admissions in the past Patient has hyponatremia secondary to poor solute intake. Data in favor of poor solute intake as patient BUN is negligible Patient creatinine is much lower for a male-low muscle mass. Patient was earlier on p.o. salt tablets but secondary to aspiration patient is now on 3% saline..     Patient sodium is trending in the right direction but is not at goal secondary to inconsistent IV saline administration. Patient has had issues with IV infiltration/IV line-multiple reasons for delay in 3% administration.  We will continue the current treatment plan, I did discuss with the nursing team about need to give 3%. We will follow up on Chem-7  2) alcohol abuse Patient is clinically stable no signs of DTs  3)Anemia of chronic disease  HGb is not at goal (9--11)   4) hypocalcemia Patient corrected calcium is at goal when corrected for low albumin of 3.3 is 8.8  5) hypokalemia Now better    Plan:  We will continue the current treatment plan We will follow up on patient's sodium levels     Reida Hem s Shantae Vantol 12/12/2019, 11:02 AM

## 2019-12-12 NOTE — Consult Note (Signed)
MEDICATION RELATED CONSULT NOTE  Pharmacy Consult for Sodium Monitoring  Indication: 3% sodium chloride continuous infusion for hyponatremia   No Known Allergies  Patient Measurements: Height: 5\' 8"  (172.7 cm) Weight: 71.7 kg (158 lb 1.1 oz) IBW/kg (Calculated) : 68.4  Vital Signs: Temp: 98.7 F (37.1 C) (04/25 1010) Temp Source: Oral (04/25 1010) BP: 131/60 (04/25 1010) Pulse Rate: 74 (04/25 1010) Intake/Output from previous day: 04/24 0701 - 04/25 0700 In: 595.4 [I.V.:595.4] Out: 1000 [Urine:1000] Intake/Output from this shift: Total I/O In: 240 [P.O.:240] Out: -   Labs: Recent Labs    12/10/19 0603 12/11/19 0130 12/12/19 0208  WBC 4.9 4.9 4.2  HGB 7.7* 8.2* 8.5*  HCT 22.7* 24.7* 23.8*  PLT 237 225 246  CREATININE 0.35* 0.40* 0.38*   BMP Latest Ref Rng & Units 12/12/2019 12/12/2019 12/12/2019  Glucose 70 - 99 mg/dL - - -  BUN 8 - 23 mg/dL - - -  Creatinine 12/14/2019 - 1.24 mg/dL - - -  Sodium 1.94 - 174 mmol/L 123(L) 120(L) 122(L)  Potassium 3.5 - 5.1 mmol/L - - -  Chloride 98 - 111 mmol/L - - -  CO2 22 - 32 mmol/L - - -  Calcium 8.9 - 10.3 mg/dL - - -    Estimated Creatinine Clearance: 76 mL/min (A) (by C-G formula based on SCr of 0.38 mg/dL (L)).   Medical History: Past Medical History:  Diagnosis Date  . Arthritis   . Pain    BACK. no specific injury   . Seizure (HCC) 2000   IN THE PAST. per patient, it was when he was drinking   Assessment: Pharmacy consulted for Sodium monitoring for 74 yo male admitted with hyponatremia. Patient is being initiated on Hypertonic (3%) saline.   DATE TIME Na Level 4/23 0603 123 4/23 1134 123 4/24  0130  125 4/24 0925 121 4/24 1935 119 4/25 0530 122 4/25 0802 120 4/25 1353 123    Goal of Therapy:  Na increase <10 mEq/24 hours   Plan:  04/25 @ 1353 Na 123 rate currently running at 35 ml/hr, will continue at this current rate and Na level currently ordered q3h per MD  5/25, PharmD Clinical  Pharmacist 12/12/2019 2:52 PM

## 2019-12-12 NOTE — Progress Notes (Signed)
PROGRESS NOTE    Javier Robinson  BOF:[id number] DOB: 1946-06-18 DOA: 12/06/2019 PCP: [facility]      Assessment & Plan:   Principal Problem:   Hypoglycemia Active Problems:   Hyponatremia   Hypomagnesemia   Hypertensive urgency   Acute encephalopathy   Alcohol dependence (HCC)   Prolonged QT interval   Malnutrition of moderate degree  Acute on chronic hyponatremia: likely secondary to ongoing alcohol use. Labile secondary inconsistent IV saline administration. Will continue on 3% NaCl & NaCl tabs as per nephro.  Nephro recs apprec   Hypertensive urgency: not on any meds. Resolved. Hydralazine prn  Hypokalemia: WNL today. Will continue to monitor   Prolonged QT interval: electrolytes repleted. Continue on tele   Alcohol abuse: no signs of withdrawal.  Monitor on CIWA.  Alcohol cessation counseling. Continue thiamine, folate and multivitamin.  Failure to thrive: PT/OT recs SNF. Will benefit from SNF.  Abd distention: no ascites on ultrasound abdomen.  Shows heterogeneous echotexture of liver parenchyma suggestive of possible diffuse liver disease on [place]. Likely secondary to alcohol abuse     DVT prophylaxis: lovenox  Code Status: full  Family Communication: Disposition Plan: likely will d/c to SNF.   Status is: Inpatient  Remains inpatient appropriate because:Persistent severe electrolyte disturbances   Dispo: The patient is from: Home              Anticipated d/c is to: SNF              Anticipated d/c date is: 2 days              Patient currently is not medically stable to d/c.      Consultants:   nephro   Procedures:    Antimicrobials:   Subjective: Pt c/o malaise  Objective: Vitals:   12/11/19 0800 12/11/19 1600 12/11/19 2341 12/12/19 1010  BP: (!) 159/79 (!) 142/62 (!) 142/73 131/60  Pulse: 93 89 86 74  Resp: 18 19 18 12   Temp: 98.9 F (37.2 C) 98.6 F (37 C) 98.6 F (37 C) 98.7 F (37.1 C)  TempSrc:  Oral Oral Oral Oral  SpO2: 98% 100% 100% 98%  Weight:      Height:        Intake/Output Summary (Last 24 hours) at 12/12/2019 1159 Last data filed at 12/12/2019 1000 Gross per 24 hour  Intake 835.38 ml  Output 1000 ml  Net -164.62 ml   Filed Weights   12/06/19 1432 12/07/19 1326  Weight: 65 kg 71.7 kg    Examination:  General exam: Appears calm & comfortable. Multiple teeth missing  Respiratory system: diminished breath sounds b/l. No rales Cardiovascular system: S1 & S2 +. No rubs, gallops or clicks. B/l LE edema Gastrointestinal system: Abdomen is nondistended, soft and nontender. Hypoactive bowel sounds heard. Central nervous system: Awake & alert. Moves all 4 extremities  Psychiatry: Judgement and insight appear normal. Flat mood and affect    Data Reviewed: I have personally reviewed following labs and imaging studies  CBC: Recent Labs  Lab 12/06/19 1438 12/06/19 1659 12/07/19 0559 12/09/19 0802 12/10/19 0603 12/11/19 0130 12/12/19 0208  WBC 4.2   < > 5.1 4.2 4.9 4.9 4.2  NEUTROABS 1.1*  --   --   --   --   --   --   HGB 9.3*   < > 9.7* 8.5* 7.7* 8.2* 8.5*  HCT 27.8*   < > 27.1* 24.8* 22.7* 24.7* 23.8*  MCV 90.6   < >  85.2 88.3 88.7 90.1 85.6  PLT 276   < > 266 242 237 225 246   < > = values in this interval not displayed.   Basic Metabolic Panel: Recent Labs  Lab 12/06/19 1438 12/06/19 1438 12/06/19 1659 12/06/19 2050 12/07/19 0559 12/07/19 1329 12/08/19 1523 12/08/19 1523 12/09/19 0802 12/09/19 0802 12/10/19 0603 12/10/19 1134 12/11/19 0130 12/11/19 0925 12/11/19 1935 12/11/19 2307 12/12/19 0208 12/12/19 0530 12/12/19 0802  NA 119*   < > 120*   < > 122*   < > 123*   < > 120*   < > 123*   < > 125*   < > 119* 121* 122*  123* 122* 120*  K 3.5   < > 3.6   < > 4.0   < > 4.7  --  3.8  --  3.6  --  4.0  --   --   --  3.5  --   --   CL 88*   < > 87*   < > 95*   < > 95*  --  92*  --  94*  --  97*  --   --   --  92*  --   --   CO2 21*   < > 23   <  > 22   < > 19*  --  22  --  23  --  25  --   --   --  23  --   --   GLUCOSE 190*   < > 56*   < > 157*   < > 84  --  79  --  89  --  92  --   --   --  80  --   --   BUN 9   < > 8   < > 7*   < > <5*  --  <5*  --  <5*  --  6*  --   --   --  <5*  --   --   CREATININE 0.46*   < > 0.36*   < > 0.32*   < > 0.34*  --  0.33*  --  0.35*  --  0.40*  --   --   --  0.38*  --   --   CALCIUM 8.3*   < > 8.7*   < > 8.3*   < > 8.7*  --  8.3*  --  8.3*  --  8.3*  --   --   --  8.3*  --   --   MG 1.6*  --  2.4  --  1.7  --   --   --   --   --   --   --   --   --   --   --   --   --   --   PHOS  --   --  3.6  --   --   --   --   --   --   --   --   --   --   --   --   --   --   --   --    < > = values in this interval not displayed.   GFR: Estimated Creatinine Clearance: 76 mL/min (A) (by C-G formula based on SCr of 0.38 mg/dL (L)). Liver Function Tests: Recent Labs  Lab 12/06/19 1438 12/06/19 1659  AST 22 21  ALT 9  10  ALKPHOS 55 59  BILITOT 0.3 0.4  PROT 7.1 7.5  ALBUMIN 3.3* 3.3*   No results for input(s): LIPASE, AMYLASE in the last 168 hours. Recent Labs  Lab 12/06/19 1659  AMMONIA 14   Coagulation Profile: No results for input(s): INR, PROTIME in the last 168 hours. Cardiac Enzymes: No results for input(s): CKTOTAL, CKMB, CKMBINDEX, TROPONINI in the last 168 hours. BNP (last 3 results) No results for input(s): PROBNP in the last 8760 hours. HbA1C: No results for input(s): HGBA1C in the last 72 hours. CBG: Recent Labs  Lab 12/11/19 1713 12/11/19 2126 12/12/19 0752 12/12/19 1012 12/12/19 1129  GLUCAP 72 89 67* 101* 83   Lipid Profile: No results for input(s): CHOL, HDL, LDLCALC, TRIG, CHOLHDL, LDLDIRECT in the last 72 hours. Thyroid Function Tests: No results for input(s): TSH, T4TOTAL, FREET4, T3FREE, THYROIDAB in the last 72 hours. Anemia Panel: No results for input(s): VITAMINB12, FOLATE, FERRITIN, TIBC, IRON, RETICCTPCT in the last 72 hours. Sepsis Labs: No results for  input(s): PROCALCITON, LATICACIDVEN in the last 168 hours.  Recent Results (from the past 240 hour(s))  SARS CORONAVIRUS 2 (TAT 6-24 HRS) Nasopharyngeal Nasopharyngeal Swab     Status: None   Collection Time: 12/06/19  4:01 PM   Specimen: Nasopharyngeal Swab  Result Value Ref Range Status   SARS Coronavirus 2 NEGATIVE NEGATIVE Final    Comment: (NOTE) SARS-CoV-2 target nucleic acids are NOT DETECTED. The SARS-CoV-2 RNA is generally detectable in upper and lower respiratory specimens during the acute phase of infection. Negative results do not preclude SARS-CoV-2 infection, do not rule out co-infections with other pathogens, and should not be used as the sole basis for treatment or other patient management decisions. Negative results must be combined with clinical observations, patient history, and epidemiological information. The expected result is Negative. Fact Sheet for Patients: SugarRoll.be Fact Sheet for Healthcare Providers: https://www.woods-mathews.com/ This test is not yet approved or cleared by the [place] FDA and  has been authorized for detection and/or diagnosis of SARS-CoV-2 by FDA under an Emergency Use Authorization (EUA). This EUA will remain  in effect (meaning this test can be used) for the duration of the COVID-19 declaration under Section 56 4(b)(1) of the Act, 21 U.S.C. section 360bbb-3(b)(1), unless the authorization is terminated or revoked sooner. Performed at [facility], [place] [address], [place], [place] [address]          Radiology Studies: No results found.      Scheduled Meds: . amLODipine  5 mg Oral Daily  . enoxaparin (LOVENOX) injection  40 mg Subcutaneous Q24H  . feeding supplement (ENSURE ENLIVE)  237 mL Oral TID  . folic acid  1 mg Oral Daily  . multivitamin with minerals  1 tablet Oral Daily  . pantoprazole  40 mg Oral Daily  . sodium chloride  1 g Oral TID WC  . tamsulosin   0.4 mg Oral QPC supper  . thiamine  100 mg Oral Daily   Or  . thiamine  100 mg Intravenous Daily   Continuous Infusions: . sodium chloride (hypertonic)       LOS: 6 days    Time spent: 32 mins     Wyvonnia Dusky, MD [facility] Pager [phone]  If 7PM-7AM, please contact night-coverage [email or web address] 12/12/2019, 11:59 AM

## 2019-12-13 LAB — GLUCOSE, CAPILLARY
Glucose-Capillary: 84 mg/dL (ref 70–99)
Glucose-Capillary: 108 mg/dL — ABNORMAL HIGH (ref 70–99)
Glucose-Capillary: 117 mg/dL — ABNORMAL HIGH (ref 70–99)
Glucose-Capillary: 124 mg/dL — ABNORMAL HIGH (ref 70–99)

## 2019-12-13 LAB — BASIC METABOLIC PANEL
Anion gap: 5 (ref 5–15)
BUN: 8 mg/dL (ref 8–23)
CO2: 25 mmol/L (ref 22–32)
Calcium: 8.6 mg/dL — ABNORMAL LOW (ref 8.9–10.3)
Chloride: 99 mmol/L (ref 98–111)
Creatinine, Ser: 0.45 mg/dL — ABNORMAL LOW (ref 0.61–1.24)
GFR calc Af Amer: >60 mL/min (ref >60)
GFR calc non Af Amer: >60 mL/min (ref >60)
Glucose, Bld: 100 mg/dL — ABNORMAL HIGH (ref 70–99)
Potassium: 3.9 mmol/L (ref 3.5–5.1)
Sodium: 129 mmol/L — ABNORMAL LOW (ref 135–145)

## 2019-12-13 LAB — CBC
HCT: 24.7 % — ABNORMAL LOW (ref 39.0–52.0)
Hemoglobin: 8.3 g/dL — ABNORMAL LOW (ref 13.0–17.0)
MCH: 30.1 pg (ref 26.0–34.0)
MCHC: 33.6 g/dL (ref 30.0–36.0)
MCV: 89.5 fL (ref 80.0–100.0)
Platelets: 241 10*3/uL (ref 150–400)
RBC: 2.76 MIL/uL — ABNORMAL LOW (ref 4.22–5.81)
RDW: 16.7 % — ABNORMAL HIGH (ref 11.5–15.5)
WBC: 4.8 10*3/uL (ref 4.0–10.5)
nRBC: 0 % (ref 0.0–0.2)

## 2019-12-13 LAB — SODIUM
Sodium: 128 mmol/L — ABNORMAL LOW (ref 135–145)
Sodium: 128 mmol/L — ABNORMAL LOW (ref 135–145)
Sodium: 131 mmol/L — ABNORMAL LOW (ref 135–145)
Sodium: 133 mmol/L — ABNORMAL LOW (ref 135–145)

## 2019-12-13 NOTE — Consult Note (Signed)
MEDICATION RELATED CONSULT NOTE  Pharmacy Consult for Sodium Monitoring  Indication: 3% sodium chloride continuous infusion for hyponatremia   No Known Allergies  Patient Measurements: Height: 5\' 8"  (172.7 cm) Weight: 71.7 kg (158 lb 1.1 oz) IBW/kg (Calculated) : 68.4  Vital Signs: Temp: 98.7 F (37.1 C) (04/26 0800) Temp Source: Axillary (04/26 0800) BP: 135/61 (04/26 0800) Pulse Rate: 92 (04/26 0800) Intake/Output from previous day: 04/25 0701 - 04/26 0700 In: 842.3 [P.O.:360; I.V.:482.3] Out: 600 [Urine:600] Intake/Output from this shift: Total I/O In: 257 [P.O.:257] Out: -   Labs: Recent Labs    12/11/19 0130 12/12/19 0208 12/13/19 0215  WBC 4.9 4.2 4.8  HGB 8.2* 8.5* 8.3*  HCT 24.7* 23.8* 24.7*  PLT 225 246 241  CREATININE 0.40* 0.38* 0.45*   BMP Latest Ref Rng & Units 12/13/2019 12/13/2019 12/13/2019  Glucose 70 - 99 mg/dL - - 12/15/2019)  BUN 8 - 23 mg/dL - - 8  Creatinine 694(W - 1.24 mg/dL - - 5.46)  Sodium 2.70(J - 145 mmol/L 128(L) 128(L) 129(L)  Potassium 3.5 - 5.1 mmol/L - - 3.9  Chloride 98 - 111 mmol/L - - 99  CO2 22 - 32 mmol/L - - 25  Calcium 8.9 - 10.3 mg/dL - - 8.6(L)    Estimated Creatinine Clearance: 76 mL/min (A) (by C-G formula based on SCr of 0.45 mg/dL (L)).   Medical History: Past Medical History:  Diagnosis Date  . Arthritis   . Pain    BACK. no specific injury   . Seizure (HCC) 2000   IN THE PAST. per patient, it was when he was drinking   Assessment: Pharmacy consulted for Sodium monitoring for 74 yo male admitted with hyponatremia. Patient is being initiated on Hypertonic (3%) saline.   DATE TIME Na Level 4/23 0603 123 4/23 1134 123 4/24  0130  125 4/24 0925 121 4/24 1935 119 4/25 0530 122 4/25 0802 120 4/25 1353 123 4/25 1715  125 4/25 2305 127 4/26 0300 129 4/26 0900 128     Goal of Therapy:  Na increase <10 mEq/24 hours   Plan:  04/26 @ 0900 Na 128 rate currently running at 35 ml/hr. Continue current  rate. F/U Na level currently ordered q3h per MD  5/26, PharmD, BCPS Clinical Pharmacist 12/13/2019 10:04 AM

## 2019-12-13 NOTE — Plan of Care (Signed)
  Problem: Clinical Measurements: Goal: Ability to maintain clinical measurements within normal limits will improve Outcome: Progressing   

## 2019-12-13 NOTE — Progress Notes (Signed)
Occupational Therapy Treatment Patient Details Name: Javier Robinson MRN: [id number] DOB: September 17, 1945 Today's Date: 12/13/2019    History of present illness Javier Robinson is a 74yoM who comes to [facility] on 4/19 for hypoglycemia. Pt also noted to have hypoNa+ 119. PMH: ETOH abuse, chronic hyponatremia, dysphagia, dementia, GERD, HTN, CHF, CVA.   OT comments  [name] seen for OT treatment on this date. Upon arrival to room pt asleep but easily awoken semi reclined in bed. Pt initially agreeable to EOB tx then declined stating he was too tired. Pt participated in BUE THEREX this date and face washing c SETUP only. Pt educated on importance of mobility for functional strengthening and pt verbalized understanding of instruction provided. Pt making good progress toward goals. Pt continues to benefit from skilled OT services to maximize return to PLOF and minimize risk of future falls, injury, caregiver burden, and readmission. Will continue to follow POC. Discharge recommendation remains appropriate.    Follow Up Recommendations  SNF    Equipment Recommendations       Recommendations for Other Services      Precautions / Restrictions Precautions Precautions: Fall Precaution Comments: Seizure, aspiration precautions Restrictions Weight Bearing Restrictions: No       Mobility Bed Mobility               General bed mobility comments: Pt deferred mobility 2/2 fatigue  Transfers                      Balance                                           ADL either performed or assessed with clinical judgement   ADL Overall ADL's : Needs assistance/impaired                                       General ADL Comments: SETUP face washing reclined in bed.      Vision       Perception     Praxis      Cognition Arousal/Alertness: Awake/alert Behavior During Therapy: WFL for tasks assessed/performed;Flat affect Overall Cognitive Status:  History of cognitive impairments - at baseline                                          Exercises Exercises: Other exercises Other Exercises Other Exercises: Pt educated: importance of OOB mobility for funcitonal strengthening Other Exercises: Face washing, repositioning trunk, BUE therex (AAROM shoulder flexion, bicep curls)   Shoulder Instructions       General Comments      Pertinent Vitals/ Pain       Pain Assessment: Faces Faces Pain Scale: Hurts little more Pain Location: LUE Pain Descriptors / Indicators: Sore;Discomfort;Grimacing Pain Intervention(s): Limited activity within patient's tolerance  Home Living                                          Prior Functioning/Environment              Frequency  Min 1X/week  Progress Toward Goals  OT Goals(current goals can now be found in the care plan section)  Progress towards OT goals: Progressing toward goals  Acute Rehab OT Goals Patient Stated Goal: regain strength and return to home with support from friend OT Goal Formulation: With patient Time For Goal Achievement: 12/23/19 Potential to Achieve Goals: Fair ADL Goals Pt Will Perform Lower Body Dressing: with mod assist;sit to/from stand;with adaptive equipment(with LRAD) Pt Will Transfer to Toilet: with mod assist;bedside commode;stand pivot transfer(with LRAD) Additional ADL Goal #1: Pt will verbalize x3 fall prevention strategies with min assist to optimize safety and independence in ADLs.  Plan Discharge plan remains appropriate;Frequency remains appropriate    Co-evaluation                 AM-PAC OT "6 Clicks" Daily Activity     Outcome Measure   Help from another person eating meals?: A Little Help from another person taking care of personal grooming?: A Little Help from another person toileting, which includes using toliet, bedpan, or urinal?: A Lot Help from another person bathing (including  washing, rinsing, drying)?: A Lot Help from another person to put on and taking off regular upper body clothing?: A Little Help from another person to put on and taking off regular lower body clothing?: A Lot 6 Click Score: 15    End of Session    OT Visit Diagnosis: Muscle weakness (generalized) (M62.81);Other abnormalities of gait and mobility (R26.89)   Activity Tolerance Patient limited by fatigue   Patient Left in bed;with call bell/phone within reach;with bed alarm set   Nurse Communication          Time: 2426-8341 OT Time Calculation (min): 12 min  Charges: OT General Charges $OT Visit: 1 Visit OT Treatments $Therapeutic Exercise: 8-22 mins  Dessie Coma, M.S. OTR/L  12/13/19, 1:02 PM

## 2019-12-13 NOTE — Progress Notes (Signed)
[facility]  ROUNDING NOTE   Subjective:   Na 131.  Patient with no complaints   3% saline at 55mL/hr   Objective:  Vital signs in last 24 hours:  Temp:  [98.1 F (36.7 C)-98.7 F (37.1 C)] 98.7 F (37.1 C) (04/26 0800) Pulse Rate:  [69-92] 92 (04/26 0800) Resp:  [13-18] 18 (04/26 0800) BP: (124-135)/(61-62) 135/61 (04/26 0800) SpO2:  [99 %-100 %] 100 % (04/26 0800)  Weight change:  Filed Weights   12/06/19 1432 12/07/19 1326  Weight: 65 kg 71.7 kg    Intake/Output: I/O last 3 completed shifts: In: 1437.6 [P.O.:360; I.V.:1077.6] Out: 1600 [Urine:1600]   Intake/Output this shift:  Total I/O In: 257 [P.O.:257] Out: -   Physical Exam: General: NAD,   Head: Normocephalic, atraumatic. Moist oral mucosal membranes  Eyes: Anicteric, PERRL  Neck: Supple, trachea midline  Lungs:  Clear to auscultation  Heart: Regular rate and rhythm  Abdomen:  Soft, nontender,   Extremities:  no peripheral edema.  Neurologic: Nonfocal, moving all four extremities  Skin: No lesions        Basic Metabolic Panel: Recent Labs  Lab 12/06/19 1438 12/06/19 1438 12/06/19 1659 12/06/19 2050 12/07/19 0559 12/07/19 1329 12/09/19 0802 12/09/19 0802 12/10/19 0603 12/10/19 1134 12/11/19 0130 12/11/19 0925 12/12/19 0208 12/12/19 0530 12/12/19 2305 12/13/19 0215 12/13/19 0547 12/13/19 0900 12/13/19 1105  NA 119*   < > 120*   < > 122*   < > 120*   < > 123*   < > 125*   < > 122*  123*   < > 127* 129* 128* 128* 131*  K 3.5   < > 3.6   < > 4.0   < > 3.8  --  3.6  --  4.0  --  3.5  --   --  3.9  --   --   --   CL 88*   < > 87*   < > 95*   < > 92*  --  94*  --  97*  --  92*  --   --  99  --   --   --   CO2 21*   < > 23   < > 22   < > 22  --  23  --  25  --  23  --   --  25  --   --   --   GLUCOSE 190*   < > 56*   < > 157*   < > 79  --  89  --  92  --  80  --   --  100*  --   --   --   BUN 9   < > 8   < > 7*   < > <5*  --  <5*  --  6*  --  <5*  --   --  8  --   --   --    CREATININE 0.46*   < > 0.36*   < > 0.32*   < > 0.33*  --  0.35*  --  0.40*  --  0.38*  --   --  0.45*  --   --   --   CALCIUM 8.3*   < > 8.7*   < > 8.3*   < > 8.3*   < > 8.3*  --  8.3*  --  8.3*  --   --  8.6*  --   --   --  MG 1.6*  --  2.4  --  1.7  --   --   --   --   --   --   --   --   --   --   --   --   --   --   PHOS  --   --  3.6  --   --   --   --   --   --   --   --   --   --   --   --   --   --   --   --    < > = values in this interval not displayed.    Liver Function Tests: Recent Labs  Lab 12/06/19 1438 12/06/19 1659  AST 22 21  ALT 9 10  ALKPHOS 55 59  BILITOT 0.3 0.4  PROT 7.1 7.5  ALBUMIN 3.3* 3.3*   No results for input(s): LIPASE, AMYLASE in the last 168 hours. Recent Labs  Lab 12/06/19 1659  AMMONIA 14    CBC: Recent Labs  Lab 12/06/19 1438 12/06/19 1659 12/09/19 0802 12/10/19 0603 12/11/19 0130 12/12/19 0208 12/13/19 0215  WBC 4.2   < > 4.2 4.9 4.9 4.2 4.8  NEUTROABS 1.1*  --   --   --   --   --   --   HGB 9.3*   < > 8.5* 7.7* 8.2* 8.5* 8.3*  HCT 27.8*   < > 24.8* 22.7* 24.7* 23.8* 24.7*  MCV 90.6   < > 88.3 88.7 90.1 85.6 89.5  PLT 276   < > 242 237 225 246 241   < > = values in this interval not displayed.    Cardiac Enzymes: No results for input(s): CKTOTAL, CKMB, CKMBINDEX, TROPONINI in the last 168 hours.  BNP: Invalid input(s): POCBNP  CBG: Recent Labs  Lab 12/12/19 1129 12/12/19 1648 12/12/19 2111 12/13/19 0745 12/13/19 1124  GLUCAP 83 112* 118* 84 108*    Microbiology: Results for orders placed or performed during the hospital encounter of 12/06/19  SARS CORONAVIRUS 2 (TAT 6-24 HRS) Nasopharyngeal Nasopharyngeal Swab     Status: None   Collection Time: 12/06/19  4:01 PM   Specimen: Nasopharyngeal Swab  Result Value Ref Range Status   SARS Coronavirus 2 NEGATIVE NEGATIVE Final    Comment: (NOTE) SARS-CoV-2 target nucleic acids are NOT DETECTED. The SARS-CoV-2 RNA is generally detectable in upper and  lower respiratory specimens during the acute phase of infection. Negative results do not preclude SARS-CoV-2 infection, do not rule out co-infections with other pathogens, and should not be used as the sole basis for treatment or other patient management decisions. Negative results must be combined with clinical observations, patient history, and epidemiological information. The expected result is Negative. Fact Sheet for Patients: SugarRoll.be Fact Sheet for Healthcare Providers: https://www.woods-mathews.com/ This test is not yet approved or cleared by the [place] FDA and  has been authorized for detection and/or diagnosis of SARS-CoV-2 by FDA under an Emergency Use Authorization (EUA). This EUA will remain  in effect (meaning this test can be used) for the duration of the COVID-19 declaration under Section 56 4(b)(1) of the Act, 21 U.S.C. section 360bbb-3(b)(1), unless the authorization is terminated or revoked sooner. Performed at [facility], [address] [place], [place] [address]     Coagulation Studies: No results for input(s): LABPROT, INR in the last 72 hours.  Urinalysis: No results for input(s): COLORURINE, LABSPEC, [place],  GLUCOSEU, HGBUR, BILIRUBINUR, KETONESUR, PROTEINUR, UROBILINOGEN, NITRITE, LEUKOCYTESUR in the last 72 hours.  Invalid input(s): APPERANCEUR    Imaging: No results found.   Medications:   . sodium chloride (hypertonic) 35 mL/hr (12/13/19 0416)   . amLODipine  5 mg Oral Daily  . enoxaparin (LOVENOX) injection  40 mg Subcutaneous Q24H  . feeding supplement (ENSURE ENLIVE)  237 mL Oral TID  . folic acid  1 mg Oral Daily  . multivitamin with minerals  1 tablet Oral Daily  . pantoprazole  40 mg Oral Daily  . sodium chloride  1 g Oral TID WC  . tamsulosin  0.4 mg Oral QPC supper  . thiamine  100 mg Oral Daily   Or  . thiamine  100 mg Intravenous Daily   acetaminophen **OR**  acetaminophen, hydrALAZINE  Assessment/ Plan:  [name] is a 74 y.o. black male with alcohol abuce, seizure disorder, lumbago, with no reported home medications who was admitted to [facility] on 12/06/2019 for Abdominal distention [R14.0] Alcohol abuse [F10.10] Hyponatremia [E87.1] Hypoglycemia [E16.2]  1. Hyponatremia: corrected with hypertonic saline - Discontinue hypertonic saline - fluid restriction.  - salt tabs  2. Hypertension:  - amlodipine and tamsulosin   LOS: 7 Celestina Gironda 4/26/202112:40 PM

## 2019-12-13 NOTE — Consult Note (Signed)
MEDICATION RELATED CONSULT NOTE  Pharmacy Consult for Sodium Monitoring  Indication: 3% sodium chloride continuous infusion for hyponatremia   No Known Allergies  Patient Measurements: Height: 5\' 8"  (172.7 cm) Weight: 71.7 kg (158 lb 1.1 oz) IBW/kg (Calculated) : 68.4  Vital Signs: Temp: 98.7 F (37.1 C) (04/26 0800) Temp Source: Axillary (04/26 0800) BP: 135/61 (04/26 0800) Pulse Rate: 92 (04/26 0800) Intake/Output from previous day: 04/25 0701 - 04/26 0700 In: 842.3 [P.O.:360; I.V.:482.3] Out: 600 [Urine:600] Intake/Output from this shift: Total I/O In: 257 [P.O.:257] Out: -   Labs: Recent Labs    12/11/19 0130 12/12/19 0208 12/13/19 0215  WBC 4.9 4.2 4.8  HGB 8.2* 8.5* 8.3*  HCT 24.7* 23.8* 24.7*  PLT 225 246 241  CREATININE 0.40* 0.38* 0.45*   BMP Latest Ref Rng & Units 12/13/2019 12/13/2019 12/13/2019  Glucose 70 - 99 mg/dL - - -  BUN 8 - 23 mg/dL - - -  Creatinine 12/15/2019 - 1.24 mg/dL - - -  Sodium 3.24 - 401 mmol/L 131(L) 128(L) 128(L)  Potassium 3.5 - 5.1 mmol/L - - -  Chloride 98 - 111 mmol/L - - -  CO2 22 - 32 mmol/L - - -  Calcium 8.9 - 10.3 mg/dL - - -    Estimated Creatinine Clearance: 76 mL/min (A) (by C-G formula based on SCr of 0.45 mg/dL (L)).   Medical History: Past Medical History:  Diagnosis Date  . Arthritis   . Pain    BACK. no specific injury   . Seizure (HCC) 2000   IN THE PAST. per patient, it was when he was drinking   Assessment: Pharmacy consulted for Sodium monitoring for 74 yo male admitted with hyponatremia. Patient is being initiated on Hypertonic (3%) saline.   If sodium rises > 4 mEq/L over 2 hours or > 6 mEq/L over 4 hours - Pharmacy will evaluate and contact MD.   DATE TIME Na Level 4/23 0603 123 4/23 1134 123 4/24  0130  125 4/24 0925 121 4/24 1935 119 4/25 0530 122 4/25 0802 120 4/25 1353 123 4/25 1715  125 4/25 2305 127 4/26 0300 129 4/26 0900 128 4/26 1105 131     Goal of Therapy:  Na increase <10  mEq/24 hours   Plan:  04/26 @ 1105 Na 131 rate currently running at 35 ml/hr. Continue current rate. F/U Na level currently ordered q3h per MD  5/26, PharmD, BCPS Clinical Pharmacist 12/13/2019 12:04 PM

## 2019-12-13 NOTE — Progress Notes (Signed)
PROGRESS NOTE    Javier Robinson  HQI:[id number] DOB: 06/12/1946 DOA: 12/06/2019 PCP: [facility]      Assessment & Plan:   Principal Problem:   Hypoglycemia Active Problems:   Hyponatremia   Hypomagnesemia   Hypertensive urgency   Acute encephalopathy   Alcohol dependence (HCC)   Prolonged QT interval   Malnutrition of moderate degree  Acute on chronic hyponatremia: likely secondary to ongoing alcohol use. Trending up today. Will continue on 3% NaCl & NaCl tabs as per nephro.  Nephro recs apprec   Hypertensive urgency: not on any meds. Resolved. Hydralazine prn  Hypokalemia: WNL today. Will continue to monitor   Prolonged QT interval: electrolytes repleted. Continue on tele   Alcohol abuse: no signs of withdrawal.  Monitor on CIWA.  Alcohol cessation counseling. Continue thiamine, folate and multivitamin.  Failure to thrive: PT/OT recs SNF. Will benefit from SNF.  Abd distention: no ascites on ultrasound abdomen.  Shows heterogeneous echotexture of liver parenchyma suggestive of possible diffuse liver disease on [place]. Likely secondary to alcohol abuse     DVT prophylaxis: lovenox  Code Status: full  Family Communication: Disposition Plan: likely will d/c to SNF.   Status is: Inpatient  Remains inpatient appropriate because:Persistent severe electrolyte disturbances   Dispo: The patient is from: Home              Anticipated d/c is to: SNF              Anticipated d/c date is: 2 days              Patient currently is not medically stable to d/c.      Consultants:   nephro   Procedures:    Antimicrobials:   Subjective: Pt c/o fatigue   Objective: Vitals:   12/11/19 2341 12/12/19 1010 12/12/19 1600 12/12/19 2343  BP: (!) 142/73 131/60 124/62 124/62  Pulse: 86 74 69 85  Resp: 18 12 13 18   Temp: 98.6 F (37 C) 98.7 F (37.1 C) 98.1 F (36.7 C) 98.6 F (37 C)  TempSrc: Oral Oral Oral Oral  SpO2: 100% 98% 100%  99%  Weight:      Height:        Intake/Output Summary (Last 24 hours) at 12/13/2019 0713 Last data filed at 12/13/2019 1324 Gross per 24 hour  Intake 842.25 ml  Output 600 ml  Net 242.25 ml   Filed Weights   12/06/19 1432 12/07/19 1326  Weight: 65 kg 71.7 kg    Examination:  General exam: Appears calm & comfortable. Multiple teeth missing  Respiratory system: decreased breath sounds b/l. No wheezes Cardiovascular system: S1 & S2 +. No rubs, gallops or clicks. B/l LE edema Gastrointestinal system: Abdomen is nondistended, soft and nontender. Normal bowel sounds heard. Central nervous system: Awake & alert. Moves all 4 extremities  Psychiatry: Judgement and insight appear normal. Flat mood and affect    Data Reviewed: I have personally reviewed following labs and imaging studies  CBC: Recent Labs  Lab 12/06/19 1438 12/06/19 1659 12/09/19 0802 12/10/19 0603 12/11/19 0130 12/12/19 0208 12/13/19 0215  WBC 4.2   < > 4.2 4.9 4.9 4.2 4.8  NEUTROABS 1.1*  --   --   --   --   --   --   HGB 9.3*   < > 8.5* 7.7* 8.2* 8.5* 8.3*  HCT 27.8*   < > 24.8* 22.7* 24.7* 23.8* 24.7*  MCV 90.6   < >  88.3 88.7 90.1 85.6 89.5  PLT 276   < > 242 237 225 246 241   < > = values in this interval not displayed.   Basic Metabolic Panel: Recent Labs  Lab 12/06/19 1438 12/06/19 1438 12/06/19 1659 12/06/19 2050 12/07/19 0559 12/07/19 1329 12/09/19 0802 12/09/19 0802 12/10/19 0603 12/10/19 1134 12/11/19 0130 12/11/19 0925 12/12/19 0208 12/12/19 0530 12/12/19 1715 12/12/19 1947 12/12/19 2305 12/13/19 0215 12/13/19 0547  NA 119*   < > 120*   < > 122*   < > 120*   < > 123*   < > 125*   < > 122*  123*   < > 125* 125* 127* 129* 128*  K 3.5   < > 3.6   < > 4.0   < > 3.8  --  3.6  --  4.0  --  3.5  --   --   --   --  3.9  --   CL 88*   < > 87*   < > 95*   < > 92*  --  94*  --  97*  --  92*  --   --   --   --  99  --   CO2 21*   < > 23   < > 22   < > 22  --  23  --  25  --  23  --   --    --   --  25  --   GLUCOSE 190*   < > 56*   < > 157*   < > 79  --  89  --  92  --  80  --   --   --   --  100*  --   BUN 9   < > 8   < > 7*   < > <5*  --  <5*  --  6*  --  <5*  --   --   --   --  8  --   CREATININE 0.46*   < > 0.36*   < > 0.32*   < > 0.33*  --  0.35*  --  0.40*  --  0.38*  --   --   --   --  0.45*  --   CALCIUM 8.3*   < > 8.7*   < > 8.3*   < > 8.3*  --  8.3*  --  8.3*  --  8.3*  --   --   --   --  8.6*  --   MG 1.6*  --  2.4  --  1.7  --   --   --   --   --   --   --   --   --   --   --   --   --   --   PHOS  --   --  3.6  --   --   --   --   --   --   --   --   --   --   --   --   --   --   --   --    < > = values in this interval not displayed.   GFR: Estimated Creatinine Clearance: 76 mL/min (A) (by C-G formula based on SCr of 0.45 mg/dL (L)). Liver Function Tests: Recent Labs  Lab 12/06/19 1438 12/06/19 1659  AST 22 21  ALT 9  10  ALKPHOS 55 59  BILITOT 0.3 0.4  PROT 7.1 7.5  ALBUMIN 3.3* 3.3*   No results for input(s): LIPASE, AMYLASE in the last 168 hours. Recent Labs  Lab 12/06/19 1659  AMMONIA 14   Coagulation Profile: No results for input(s): INR, PROTIME in the last 168 hours. Cardiac Enzymes: No results for input(s): CKTOTAL, CKMB, CKMBINDEX, TROPONINI in the last 168 hours. BNP (last 3 results) No results for input(s): PROBNP in the last 8760 hours. HbA1C: No results for input(s): HGBA1C in the last 72 hours. CBG: Recent Labs  Lab 12/12/19 0752 12/12/19 1012 12/12/19 1129 12/12/19 1648 12/12/19 2111  GLUCAP 67* 101* 83 112* 118*   Lipid Profile: No results for input(s): CHOL, HDL, LDLCALC, TRIG, CHOLHDL, LDLDIRECT in the last 72 hours. Thyroid Function Tests: No results for input(s): TSH, T4TOTAL, FREET4, T3FREE, THYROIDAB in the last 72 hours. Anemia Panel: No results for input(s): VITAMINB12, FOLATE, FERRITIN, TIBC, IRON, RETICCTPCT in the last 72 hours. Sepsis Labs: No results for input(s): PROCALCITON, LATICACIDVEN in the last 168  hours.  Recent Results (from the past 240 hour(s))  SARS CORONAVIRUS 2 (TAT 6-24 HRS) Nasopharyngeal Nasopharyngeal Swab     Status: None   Collection Time: 12/06/19  4:01 PM   Specimen: Nasopharyngeal Swab  Result Value Ref Range Status   SARS Coronavirus 2 NEGATIVE NEGATIVE Final    Comment: (NOTE) SARS-CoV-2 target nucleic acids are NOT DETECTED. The SARS-CoV-2 RNA is generally detectable in upper and lower respiratory specimens during the acute phase of infection. Negative results do not preclude SARS-CoV-2 infection, do not rule out co-infections with other pathogens, and should not be used as the sole basis for treatment or other patient management decisions. Negative results must be combined with clinical observations, patient history, and epidemiological information. The expected result is Negative. Fact Sheet for Patients: HairSlick.no Fact Sheet for Healthcare Providers: quierodirigir.com This test is not yet approved or cleared by the [place] FDA and  has been authorized for detection and/or diagnosis of SARS-CoV-2 by FDA under an Emergency Use Authorization (EUA). This EUA will remain  in effect (meaning this test can be used) for the duration of the COVID-19 declaration under Section 56 4(b)(1) of the Act, 21 U.S.C. section 360bbb-3(b)(1), unless the authorization is terminated or revoked sooner. Performed at [facility], [address], [place], [place] [address]          Radiology Studies: No results found.      Scheduled Meds: . amLODipine  5 mg Oral Daily  . enoxaparin (LOVENOX) injection  40 mg Subcutaneous Q24H  . feeding supplement (ENSURE ENLIVE)  237 mL Oral TID  . folic acid  1 mg Oral Daily  . multivitamin with minerals  1 tablet Oral Daily  . pantoprazole  40 mg Oral Daily  . sodium chloride  1 g Oral TID WC  . tamsulosin  0.4 mg Oral QPC supper  . thiamine  100 mg Oral  Daily   Or  . thiamine  100 mg Intravenous Daily   Continuous Infusions: . sodium chloride (hypertonic) 35 mL/hr (12/13/19 0416)     LOS: 7 days    Time spent: 30 mins     Charise Killian, MD [facility] Pager [phone]  If 7PM-7AM, please contact night-coverage [email or web address] 12/13/2019, 7:13 AM

## 2019-12-13 NOTE — Consult Note (Signed)
MEDICATION RELATED CONSULT NOTE  Pharmacy Consult for Sodium Monitoring  Indication: 3% sodium chloride continuous infusion for hyponatremia   No Known Allergies  Patient Measurements: Height: 5\' 8"  (172.7 cm) Weight: 71.7 kg (158 lb 1.1 oz) IBW/kg (Calculated) : 68.4  Vital Signs: Temp: 98.6 F (37 C) (04/25 2343) Temp Source: Oral (04/25 2343) BP: 124/62 (04/25 2343) Pulse Rate: 85 (04/25 2343) Intake/Output from previous day: 04/25 0701 - 04/26 0700 In: 842.3 [P.O.:360; I.V.:482.3] Out: -  Intake/Output from this shift: Total I/O In: 314.5 [I.V.:314.5] Out: -   Labs: Recent Labs    12/11/19 0130 12/12/19 0208 12/13/19 0215  WBC 4.9 4.2 4.8  HGB 8.2* 8.5* 8.3*  HCT 24.7* 23.8* 24.7*  PLT 225 246 241  CREATININE 0.40* 0.38* 0.45*   BMP Latest Ref Rng & Units 12/13/2019 12/12/2019 12/12/2019  Glucose 70 - 99 mg/dL 12/14/2019) - -  BUN 8 - 23 mg/dL 8 - -  Creatinine 160(V - 1.24 mg/dL 3.71) - -  Sodium 0.62(I - 145 mmol/L 129(L) 127(L) 125(L)  Potassium 3.5 - 5.1 mmol/L 3.9 - -  Chloride 98 - 111 mmol/L 99 - -  CO2 22 - 32 mmol/L 25 - -  Calcium 8.9 - 10.3 mg/dL 948) - -    Estimated Creatinine Clearance: 76 mL/min (A) (by C-G formula based on SCr of 0.45 mg/dL (L)).   Medical History: Past Medical History:  Diagnosis Date  . Arthritis   . Pain    BACK. no specific injury   . Seizure (HCC) 2000   IN THE PAST. per patient, it was when he was drinking   Assessment: Pharmacy consulted for Sodium monitoring for 74 yo male admitted with hyponatremia. Patient is being initiated on Hypertonic (3%) saline.   DATE TIME Na Level 4/23 0603 123 4/23 1134 123 4/24  0130  125 4/24 0925 121 4/24 1935 119 4/25 0530 122 4/25 0802 120 4/25 1353 123 4/25 1715  125   Goal of Therapy:  Na increase <10 mEq/24 hours   Plan:  04/26 @ 0300 Na 129 rate currently running at 35 ml/hr, will continue at this current rate and Na level currently ordered q3h per MD  5/26, PharmD Clinical Pharmacist 12/13/2019 5:39 AM

## 2019-12-13 NOTE — Care Management Important Message (Signed)
Important Message  Patient Details  Name: VITALIY EISENHOUR MRN: [id number] Date of Birth: 1946/04/06   Medicare Important Message Given:  Yes     Olegario Messier A Hulen Mandler 12/13/2019, 11:54 AM

## 2019-12-14 LAB — GLUCOSE, CAPILLARY
Glucose-Capillary: 80 mg/dL (ref 70–99)
Glucose-Capillary: 121 mg/dL — ABNORMAL HIGH (ref 70–99)
Glucose-Capillary: 121 mg/dL — ABNORMAL HIGH (ref 70–99)
Glucose-Capillary: 96 mg/dL (ref 70–99)

## 2019-12-14 LAB — BASIC METABOLIC PANEL
Anion gap: 6 (ref 5–15)
BUN: 12 mg/dL (ref 8–23)
CO2: 25 mmol/L (ref 22–32)
Calcium: 8.8 mg/dL — ABNORMAL LOW (ref 8.9–10.3)
Chloride: 103 mmol/L (ref 98–111)
Creatinine, Ser: 0.44 mg/dL — ABNORMAL LOW (ref 0.61–1.24)
GFR calc Af Amer: >60 mL/min (ref >60)
GFR calc non Af Amer: >60 mL/min (ref >60)
Glucose, Bld: 93 mg/dL (ref 70–99)
Potassium: 4 mmol/L (ref 3.5–5.1)
Sodium: 134 mmol/L — ABNORMAL LOW (ref 135–145)

## 2019-12-14 LAB — SARS CORONAVIRUS 2 (TAT 6-24 HRS): SARS Coronavirus 2: NEGATIVE

## 2019-12-14 LAB — CBC
HCT: 24 % — ABNORMAL LOW (ref 39.0–52.0)
Hemoglobin: 8.2 g/dL — ABNORMAL LOW (ref 13.0–17.0)
MCH: 30.1 pg (ref 26.0–34.0)
MCHC: 34.2 g/dL (ref 30.0–36.0)
MCV: 88.2 fL (ref 80.0–100.0)
Platelets: 237 10*3/uL (ref 150–400)
RBC: 2.72 MIL/uL — ABNORMAL LOW (ref 4.22–5.81)
RDW: 17.2 % — ABNORMAL HIGH (ref 11.5–15.5)
WBC: 5.6 10*3/uL (ref 4.0–10.5)
nRBC: 0 % (ref 0.0–0.2)

## 2019-12-14 MED ORDER — BENZONATATE 100 MG PO CAPS: 200.0000 mg | ORAL_CAPSULE | Freq: Three times a day (TID) | ORAL | Status: DC | PRN

## 2019-12-14 MED ORDER — IPRATROPIUM-ALBUTEROL 0.5-2.5 (3) MG/3ML IN SOLN: 3.0000 mL | RESPIRATORY_TRACT | Status: DC | PRN

## 2019-12-14 NOTE — Progress Notes (Signed)
[facility]  ROUNDING NOTE   Subjective:   Na 134  Patient with no complaints  Objective:  Vital signs in last 24 hours:  Temp:  [98.8 F (37.1 C)-100.2 F (37.9 C)] 99.2 F (37.3 C) (04/27 0751) Pulse Rate:  [82-92] 82 (04/27 0751) Resp:  [17-20] 17 (04/27 0751) BP: (124-139)/(50-59) 139/57 (04/27 0751) SpO2:  [96 %-98 %] 98 % (04/27 0751)  Weight change:  Filed Weights   12/06/19 1432 12/07/19 1326  Weight: 65 kg 71.7 kg    Intake/Output: I/O last 3 completed shifts: In: 811.5 [P.O.:497; I.V.:314.5] Out: 1950 [Urine:1950]   Intake/Output this shift:  Total I/O In: 240 [P.O.:240] Out: -   Physical Exam: General: NAD,   Head: Normocephalic, atraumatic. Moist oral mucosal membranes  Eyes: Anicteric, PERRL  Neck: Supple, trachea midline  Lungs:  Clear to auscultation  Heart: Regular rate and rhythm  Abdomen:  Soft, nontender, +umbilical hernia  Extremities:  no peripheral edema.  Neurologic: Nonfocal, moving all four extremities  Skin: No lesions        Basic Metabolic Panel: Recent Labs  Lab 12/10/19 0603 12/10/19 1134 12/11/19 0130 12/11/19 0925 12/12/19 0208 12/12/19 0530 12/13/19 0215 12/13/19 0215 12/13/19 0547 12/13/19 0900 12/13/19 1105 12/13/19 1740 12/14/19 0540  NA 123*   < > 125*   < > 122*  123*   < > 129*   < > 128* 128* 131* 133* 134*  K 3.6  --  4.0  --  3.5  --  3.9  --   --   --   --   --  4.0  CL 94*  --  97*  --  92*  --  99  --   --   --   --   --  103  CO2 23  --  25  --  23  --  25  --   --   --   --   --  25  GLUCOSE 89  --  92  --  80  --  100*  --   --   --   --   --  93  BUN <5*  --  6*  --  <5*  --  8  --   --   --   --   --  12  CREATININE 0.35*  --  0.40*  --  0.38*  --  0.45*  --   --   --   --   --  0.44*  CALCIUM 8.3*  --  8.3*  --  8.3*  --  8.6*  --   --   --   --   --  8.8*   < > = values in this interval not displayed.    Liver Function Tests: No results for input(s): AST, ALT, ALKPHOS,  BILITOT, PROT, ALBUMIN in the last 168 hours. No results for input(s): LIPASE, AMYLASE in the last 168 hours. No results for input(s): AMMONIA in the last 168 hours.  CBC: Recent Labs  Lab 12/10/19 0603 12/11/19 0130 12/12/19 0208 12/13/19 0215 12/14/19 0540  WBC 4.9 4.9 4.2 4.8 5.6  HGB 7.7* 8.2* 8.5* 8.3* 8.2*  HCT 22.7* 24.7* 23.8* 24.7* 24.0*  MCV 88.7 90.1 85.6 89.5 88.2  PLT 237 225 246 241 237    Cardiac Enzymes: No results for input(s): CKTOTAL, CKMB, CKMBINDEX, TROPONINI in the last 168 hours.  BNP: Invalid input(s): POCBNP  CBG: Recent Labs  Lab 12/13/19 1124 12/13/19  1652 12/13/19 2058 12/14/19 0806 12/14/19 1131  GLUCAP 108* 117* 124* 80 121*    Microbiology: Results for orders placed or performed during the hospital encounter of 12/06/19  SARS CORONAVIRUS 2 (TAT 6-24 HRS) Nasopharyngeal Nasopharyngeal Swab     Status: None   Collection Time: 12/06/19  4:01 PM   Specimen: Nasopharyngeal Swab  Result Value Ref Range Status   SARS Coronavirus 2 NEGATIVE NEGATIVE Final    Comment: (NOTE) SARS-CoV-2 target nucleic acids are NOT DETECTED. The SARS-CoV-2 RNA is generally detectable in upper and lower respiratory specimens during the acute phase of infection. Negative results do not preclude SARS-CoV-2 infection, do not rule out co-infections with other pathogens, and should not be used as the sole basis for treatment or other patient management decisions. Negative results must be combined with clinical observations, patient history, and epidemiological information. The expected result is Negative. Fact Sheet for Patients: HairSlick.no Fact Sheet for Healthcare Providers: quierodirigir.com This test is not yet approved or cleared by the [place] FDA and  has been authorized for detection and/or diagnosis of SARS-CoV-2 by FDA under an Emergency Use Authorization (EUA). This EUA will remain  in  effect (meaning this test can be used) for the duration of the COVID-19 declaration under Section 56 4(b)(1) of the Act, 21 U.S.C. section 360bbb-3(b)(1), unless the authorization is terminated or revoked sooner. Performed at [facility], [address], [place], [place] [address]     Coagulation Studies: No results for input(s): LABPROT, INR in the last 72 hours.  Urinalysis: No results for input(s): COLORURINE, LABSPEC, PHURINE, GLUCOSEU, HGBUR, BILIRUBINUR, KETONESUR, PROTEINUR, UROBILINOGEN, NITRITE, LEUKOCYTESUR in the last 72 hours.  Invalid input(s): APPERANCEUR    Imaging: No results found.   Medications:    . amLODipine  5 mg Oral Daily  . enoxaparin (LOVENOX) injection  40 mg Subcutaneous Q24H  . feeding supplement (ENSURE ENLIVE)  237 mL Oral TID  . folic acid  1 mg Oral Daily  . multivitamin with minerals  1 tablet Oral Daily  . pantoprazole  40 mg Oral Daily  . sodium chloride  1 g Oral TID WC  . tamsulosin  0.4 mg Oral QPC supper  . thiamine  100 mg Oral Daily   Or  . thiamine  100 mg Intravenous Daily   acetaminophen **OR** acetaminophen, hydrALAZINE  Assessment/ Plan:  [name] is a 74 y.o. black male with alcohol abuce, seizure disorder, lumbago, with no reported home medications who was admitted to [facility] on 12/06/2019 for Abdominal distention [R14.0] Alcohol abuse [F10.10] Hyponatremia [E87.1] Hypoglycemia [E16.2]  1. Hyponatremia: corrected with hypertonic saline - Discontinued hypertonic saline - fluid restriction.  - salt tabs  2. Hypertension:  - amlodipine and tamsulosin   LOS: 8 Javier Robinson 4/27/20211:25 PM

## 2019-12-14 NOTE — Progress Notes (Signed)
PT Cancellation Note  Patient Details Name: Javier Robinson MRN: [id number] DOB: 21-Jul-1946   Cancelled Treatment:     PT attempt, pt refused. Max encouragement to participate but pt unwilling. Pt requested therapist return later this date/time. Prefers morning sessions.    Rushie Chestnut 12/14/2019, 4:32 PM

## 2019-12-14 NOTE — TOC Progression Note (Signed)
Transition of Care [facility]) - Progression Note    Patient Details  Name: Javier Robinson MRN: [id number] Date of Birth: January 19, 1946  Transition of Care [facility]) CM/SW Contact  Allayne Butcher, RN Phone Number: 12/14/2019, 2:06 PM  Clinical Narrative:    Insurance authorization started for skilled nursing rehab with [facility].  Patient and family preference is for patient to go to [facility].  Patient has 7 days of rehab left covered at 100%.  After 7 days patient will need to hand over his social security check to cover the copay and if requires long term care.  Patient's niece [name] notified of this, [facility] business office will call and discuss financials with [name].     Expected Discharge Plan: Skilled Nursing Facility Barriers to Discharge: Continued Medical Work up  Expected Discharge Plan and Services Expected Discharge Plan: Skilled Nursing Facility     Post Acute Care Choice: Skilled Nursing Facility Living arrangements for the past 2 months: Single Family Home                                       Social Determinants of Health (SDOH) Interventions    Readmission Risk Interventions No flowsheet data found.

## 2019-12-14 NOTE — Progress Notes (Signed)
PROGRESS NOTE    Javier Robinson  JME:[id number] DOB: Dec 06, 1945 DOA: 12/06/2019 PCP: [facility]      Assessment & Plan:   Principal Problem:   Hypoglycemia Active Problems:   Hyponatremia   Hypomagnesemia   Hypertensive urgency   Acute encephalopathy   Alcohol dependence (HCC)   Prolonged QT interval   Malnutrition of moderate degree  Acute on chronic hyponatremia: likely secondary to ongoing alcohol use. Trending up. D/c 3% NaCl & continue on NaCl tabs & fluid restriction as per nephro.  Nephro recs apprec. Will need to f/u w/ Dr. Wynelle Link in 1-2 weeks   Hypertensive urgency: not on any meds. Resolved. Hydralazine prn  Hypokalemia: WNL today. Will continue to monitor   Prolonged QT interval: electrolytes repleted. Continue on tele   Alcohol abuse: no signs of withdrawal.  Monitor on CIWA.  Alcohol cessation counseling. Continue thiamine, folate and multivitamin.  Failure to thrive: PT/OT recs SNF. Will benefit from SNF.  Abd distention: no ascites on ultrasound abdomen.  Shows heterogeneous echotexture of liver parenchyma suggestive of possible diffuse liver disease on [place]. Likely secondary to alcohol abuse     DVT prophylaxis: lovenox  Code Status: full  Family Communication: Disposition Plan:  Stable for d/c to SNF. CM is working on this   Status is: Inpatient  Remains inpatient appropriate because: medically stable for d/c. Awaiting pt to get a bed at SNF    Dispo: The patient is from: Home              Anticipated d/c is to: SNF              Anticipated d/c date is: 1 day              Patient currently: medically stable for d/c       Consultants:   nephro   Procedures:    Antimicrobials:   Subjective: Pt c/o malaise   Objective: Vitals:   12/12/19 2343 12/13/19 0800 12/13/19 1400 12/13/19 2357  BP: 124/62 135/61 (!) 132/50 (!) 124/59  Pulse: 85 92  92  Resp: 18 18 20 19   Temp: 98.6 F (37 C) 98.7 F (37.1  C) 98.8 F (37.1 C) 100.2 F (37.9 C)  TempSrc: Oral Axillary Oral Axillary  SpO2: 99% 100% 98% 96%  Weight:      Height:        Intake/Output Summary (Last 24 hours) at 12/14/2019 0723 Last data filed at 12/14/2019 0625 Gross per 24 hour  Intake 497 ml  Output 1350 ml  Net -853 ml   Filed Weights   12/06/19 1432 12/07/19 1326  Weight: 65 kg 71.7 kg    Examination:  General exam: Appears calm & comfortable. Multiple teeth missing  Respiratory system: diminished breath sounds b/l. No rales Cardiovascular system: S1 & S2 +. No rubs, gallops or clicks. B/l LE edema Gastrointestinal system: Abdomen is nondistended, soft and nontender. Normal bowel sounds heard. Central nervous system: Awake & alert. Moves all 4 extremities  Psychiatry: Judgement and insight appear normal. Flat mood and affect    Data Reviewed: I have personally reviewed following labs and imaging studies  CBC: Recent Labs  Lab 12/10/19 0603 12/11/19 0130 12/12/19 0208 12/13/19 0215 12/14/19 0540  WBC 4.9 4.9 4.2 4.8 5.6  HGB 7.7* 8.2* 8.5* 8.3* 8.2*  HCT 22.7* 24.7* 23.8* 24.7* 24.0*  MCV 88.7 90.1 85.6 89.5 88.2  PLT 237 225 246 241 237   Basic Metabolic Panel:  Recent Labs  Lab 12/10/19 0603 12/10/19 1134 12/11/19 0130 12/11/19 0925 12/12/19 0208 12/12/19 0530 12/13/19 0215 12/13/19 0215 12/13/19 0547 12/13/19 0900 12/13/19 1105 12/13/19 1740 12/14/19 0540  NA 123*   < > 125*   < > 122*  123*   < > 129*   < > 128* 128* 131* 133* 134*  K 3.6  --  4.0  --  3.5  --  3.9  --   --   --   --   --  4.0  CL 94*  --  97*  --  92*  --  99  --   --   --   --   --  103  CO2 23  --  25  --  23  --  25  --   --   --   --   --  25  GLUCOSE 89  --  92  --  80  --  100*  --   --   --   --   --  93  BUN <5*  --  6*  --  <5*  --  8  --   --   --   --   --  12  CREATININE 0.35*  --  0.40*  --  0.38*  --  0.45*  --   --   --   --   --  0.44*  CALCIUM 8.3*  --  8.3*  --  8.3*  --  8.6*  --   --   --   --    --  8.8*   < > = values in this interval not displayed.   GFR: Estimated Creatinine Clearance: 76 mL/min (A) (by C-G formula based on SCr of 0.44 mg/dL (L)). Liver Function Tests: No results for input(s): AST, ALT, ALKPHOS, BILITOT, PROT, ALBUMIN in the last 168 hours. No results for input(s): LIPASE, AMYLASE in the last 168 hours. No results for input(s): AMMONIA in the last 168 hours. Coagulation Profile: No results for input(s): INR, PROTIME in the last 168 hours. Cardiac Enzymes: No results for input(s): CKTOTAL, CKMB, CKMBINDEX, TROPONINI in the last 168 hours. BNP (last 3 results) No results for input(s): PROBNP in the last 8760 hours. HbA1C: No results for input(s): HGBA1C in the last 72 hours. CBG: Recent Labs  Lab 12/12/19 2111 12/13/19 0745 12/13/19 1124 12/13/19 1652 12/13/19 2058  GLUCAP 118* 84 108* 117* 124*   Lipid Profile: No results for input(s): CHOL, HDL, LDLCALC, TRIG, CHOLHDL, LDLDIRECT in the last 72 hours. Thyroid Function Tests: No results for input(s): TSH, T4TOTAL, FREET4, T3FREE, THYROIDAB in the last 72 hours. Anemia Panel: No results for input(s): VITAMINB12, FOLATE, FERRITIN, TIBC, IRON, RETICCTPCT in the last 72 hours. Sepsis Labs: No results for input(s): PROCALCITON, LATICACIDVEN in the last 168 hours.  Recent Results (from the past 240 hour(s))  SARS CORONAVIRUS 2 (TAT 6-24 HRS) Nasopharyngeal Nasopharyngeal Swab     Status: None   Collection Time: 12/06/19  4:01 PM   Specimen: Nasopharyngeal Swab  Result Value Ref Range Status   SARS Coronavirus 2 NEGATIVE NEGATIVE Final    Comment: (NOTE) SARS-CoV-2 target nucleic acids are NOT DETECTED. The SARS-CoV-2 RNA is generally detectable in upper and lower respiratory specimens during the acute phase of infection. Negative results do not preclude SARS-CoV-2 infection, do not rule out co-infections with other pathogens, and should not be used as the sole basis for treatment or other patient  management decisions.  Negative results must be combined with clinical observations, patient history, and epidemiological information. The expected result is Negative. Fact Sheet for Patients: SugarRoll.be Fact Sheet for Healthcare Providers: https://www.woods-mathews.com/ This test is not yet approved or cleared by the [place] FDA and  has been authorized for detection and/or diagnosis of SARS-CoV-2 by FDA under an Emergency Use Authorization (EUA). This EUA will remain  in effect (meaning this test can be used) for the duration of the COVID-19 declaration under Section 56 4(b)(1) of the Act, 21 U.S.C. section 360bbb-3(b)(1), unless the authorization is terminated or revoked sooner. Performed at [facility], [place] [address], [place], [place] [address]          Radiology Studies: No results found.      Scheduled Meds: . amLODipine  5 mg Oral Daily  . enoxaparin (LOVENOX) injection  40 mg Subcutaneous Q24H  . feeding supplement (ENSURE ENLIVE)  237 mL Oral TID  . folic acid  1 mg Oral Daily  . multivitamin with minerals  1 tablet Oral Daily  . pantoprazole  40 mg Oral Daily  . sodium chloride  1 g Oral TID WC  . tamsulosin  0.4 mg Oral QPC supper  . thiamine  100 mg Oral Daily   Or  . thiamine  100 mg Intravenous Daily   Continuous Infusions:    LOS: 8 days    Time spent: 31 mins     Wyvonnia Dusky, MD [facility] Pager [phone]  If 7PM-7AM, please contact night-coverage [email or web address] 12/14/2019, 7:23 AM

## 2019-12-15 ENCOUNTER — Inpatient Hospital Stay: Payer: Medicare Other

## 2019-12-15 DIAGNOSIS — Z9189 Other specified personal risk factors, not elsewhere classified: Secondary | ICD-10-CM

## 2019-12-15 DIAGNOSIS — F10288 Alcohol dependence with other alcohol-induced disorder: Secondary | ICD-10-CM

## 2019-12-15 DIAGNOSIS — G9341 Metabolic encephalopathy: Secondary | ICD-10-CM | POA: Diagnosis present

## 2019-12-15 LAB — BASIC METABOLIC PANEL
Anion gap: 7 (ref 5–15)
BUN: 12 mg/dL (ref 8–23)
CO2: 26 mmol/L (ref 22–32)
Calcium: 8.7 mg/dL — ABNORMAL LOW (ref 8.9–10.3)
Chloride: 98 mmol/L (ref 98–111)
Creatinine, Ser: 0.5 mg/dL — ABNORMAL LOW (ref 0.61–1.24)
GFR calc Af Amer: >60 mL/min (ref >60)
GFR calc non Af Amer: >60 mL/min (ref >60)
Glucose, Bld: 90 mg/dL (ref 70–99)
Potassium: 3.7 mmol/L (ref 3.5–5.1)
Sodium: 131 mmol/L — ABNORMAL LOW (ref 135–145)

## 2019-12-15 LAB — CBC
HCT: 24.2 % — ABNORMAL LOW (ref 39.0–52.0)
Hemoglobin: 8.1 g/dL — ABNORMAL LOW (ref 13.0–17.0)
MCH: 30.3 pg (ref 26.0–34.0)
MCHC: 33.5 g/dL (ref 30.0–36.0)
MCV: 90.6 fL (ref 80.0–100.0)
Platelets: 274 10*3/uL (ref 150–400)
RBC: 2.67 MIL/uL — ABNORMAL LOW (ref 4.22–5.81)
RDW: 17.2 % — ABNORMAL HIGH (ref 11.5–15.5)
WBC: 6.5 10*3/uL (ref 4.0–10.5)
nRBC: 0 % (ref 0.0–0.2)

## 2019-12-15 LAB — GLUCOSE, CAPILLARY
Glucose-Capillary: 80 mg/dL (ref 70–99)
Glucose-Capillary: 80 mg/dL (ref 70–99)
Glucose-Capillary: 96 mg/dL (ref 70–99)
Glucose-Capillary: 103 mg/dL — ABNORMAL HIGH (ref 70–99)

## 2019-12-15 MED ORDER — ENSURE ENLIVE PO LIQD: 237.0000 mL | Freq: Three times a day (TID) | ORAL | 12 refills | Status: DC

## 2019-12-15 MED ORDER — FOLIC ACID 1 MG PO TABS: 1.0000 mg | ORAL_TABLET | Freq: Every day | ORAL | 0 refills | Status: DC

## 2019-12-15 MED ORDER — SODIUM CHLORIDE 1 G PO TABS: 1.0000 g | ORAL_TABLET | Freq: Three times a day (TID) | ORAL | 0 refills | Status: DC

## 2019-12-15 MED ORDER — TAMSULOSIN HCL 0.4 MG PO CAPS: 0.4000 mg | ORAL_CAPSULE | Freq: Every day | ORAL | 0 refills | Status: AC

## 2019-12-15 MED ORDER — ADULT MULTIVITAMIN W/MINERALS CH: 1.0000 | ORAL_TABLET | Freq: Every day | ORAL | 0 refills | Status: DC

## 2019-12-15 MED ORDER — THIAMINE HCL 100 MG PO TABS: 100.0000 mg | ORAL_TABLET | Freq: Every day | ORAL | 0 refills | Status: DC

## 2019-12-15 MED ORDER — AMLODIPINE BESYLATE 5 MG PO TABS: 5.0000 mg | ORAL_TABLET | Freq: Every day | ORAL | 0 refills | Status: DC

## 2019-12-15 MED ORDER — NEPRO PO LIQD: 1.0000 | Freq: Three times a day (TID) | ORAL | 0 refills | Status: AC

## 2019-12-15 NOTE — Progress Notes (Signed)
Speech Language Pathology Treatment: Dysphagia  Patient Details Name: Javier Robinson MRN: [id number] DOB: 07/02/1946 Today's Date: 12/15/2019 Time: 1205-1300 SLP Time Calculation (min) (ACUTE ONLY): 55 min  Assessment / Plan / Recommendation Clinical Impression  Pt seen today for assessment of toleration of Dysphagia diet; education on aspiration precautions and swallowing strategies including use of f/u, DRY swallow in Between bites/sips to clear any potential pharyngeal residue remaining post initial swallows. Pt continues to requite verbal/tactile cues as well as visual cues to aid follow through w/ precautions and strategies d/t Cognitive decline -- suspect impacted by ETOH abuse. He is able to feed himself w/ tray setup and support.  Pt consumed trials of Nectar consistency liquids via Cup and tsps of Pureed foods at his Lunch meal. Noted min increased energy w/ self-feeding and need for monitoring w/ bolus size at the beginning of the meal; as pt fatigued, he slowed in his self-feeding and took smaller bites. This is important as pt will need more Supervision at the beginning of meals to follow through w/ aspiration precautions re: bolus size. Pt consumed po's following precautions and swallowing strategy of a f/u, DRY swallow in between bites/sips w/ No overt coughing or wet vocal quality noted when assessed; however, Mild+, delayed throat clearing noted x2 w/ Puree foods -- suspect when pt took a larger bite and did not immediately use the f/u, DRY swallow to clear residue. Continued verbal/tactile cues and visual cues of alternating foods, then foods w/ liquids, given throughout the entire meal. No decline in overall respiratory status noted at end of meal - pt consumed ~75% of his meal w/ SLP supporting and giving cues to lessen risk for aspiration.  Recommend continue current Dysphagia level 1 (puree) w/ NEctar consistency liquids via CUP; strict aspiration precautions; swallow strategy of f/u,  DRY swallow b/t bites/sips; Pills CRUSHED in Puree for safer swallowing. Monitoring at meals for follow through w/ precautions and strategies.  ST services will be available for ongoing education as needed while admitted. NSG and MD updated.     HPI HPI: Pt is a 74 y.o. male with past medical history including chronic alcohol abuse and dependence, Malnutrition of moderate degree, CHF, Metabolic acidosis, increased anion gap (IAG), chronic hyponatremia, who presents to the ED for hyperglycemia.  Last admission was for altered mental status, confusion.  History is limited due to patient's confusion.  Per EMS, patient was noted to be decreasingly alert throughout the day today and when EMS was called, he was found to have a blood glucose in the 20s.  He was given a dose of D10 initially, then required a second dose of D10 for ongoing hyperglycemia.  He reports a chronic cough that is unchanged, denies any fevers, chills, chest pain, shortness of breath, dysuria, abdominal pain, vomiting, or diarrhea. Head CT: "No acute; Mild to moderate atrophy, old left basal ganglia lacunar infarct and chronic ischemic changes".  CXR: "no active disease".  He has a caregiver who checks on him per last admission and uses a walker at home for mobility.       SLP Plan  Continue with current plan of care       Recommendations  Diet recommendations: Dysphagia 1 (puree);Nectar-thick liquid Liquids provided via: Cup;No straw Medication Administration: Crushed with puree(for safer swallowing) Supervision: Patient able to self feed;Intermittent supervision to cue for compensatory strategies(support) Compensations: Minimize environmental distractions;Slow rate;Small sips/bites;Lingual sweep for clearance of pocketing;Multiple dry swallows after each bite/sip;Follow solids with liquid Postural Changes and/or  Swallow Maneuvers: Out of bed for meals;Seated upright 90 degrees;Upright 30-60 min after meal                 General recommendations: (Dietician f/u) Oral Care Recommendations: Oral care BID;Oral care before and after PO;Staff/trained caregiver to provide oral care Follow up Recommendations: Skilled Nursing facility SLP Visit Diagnosis: Dysphagia, oropharyngeal phase (R13.12) Plan: Continue with current plan of care       GO                 Javier Som, MS, CCC-SLP' Javier Robinson 12/15/2019, 2:37 PM

## 2019-12-15 NOTE — Progress Notes (Signed)
Physical Therapy Treatment Patient Details Name: Javier Robinson MRN: [id number] DOB: 1946-08-15 Today's Date: 12/15/2019    History of Present Illness Javier Robinson is a 40yoM who [facility] on 4/[facility] for hypoglycemia. Pt also noted to have hypoNa+ 119. PMH: ETOH abuse, chronic hyponatremia, dysphagia, dementia, GERD, HTN, CHF, CVA.    PT Comments    Pt in bed, lightly sleeping.  Supine ex as below.  Unable to complete ex's on his own.  Needs physical assist and constant verbal and tactile cues to complete.  Pt noted to be inc of urine.  Condom cath had come off and was attached to his knee and linens saturated in urine.  Tech in to assist with care.  Rolling left/right with max a x 2.  He is able to hold rail once side lying to assist some in holding position but needs max a x 1 to prevent rolling back on bed.  He is unable to initiate reaching left or right in supine.  Pt generally stiff with movements. Full linen and care given.  Tech remained in room to assist with breakfast.   Follow Up Recommendations  SNF     Equipment Recommendations  None recommended by PT;Other (comment)    Recommendations for Other Services       Precautions / Restrictions Precautions Precautions: Fall Precaution Comments: Seizure, aspiration precautions Restrictions Weight Bearing Restrictions: No    Mobility  Bed Mobility Overal bed mobility: Needs Assistance Bed Mobility: Rolling Rolling: Total assist;+2 for physical assistance;Max assist         General bed mobility comments: deferred  Transfers                 General transfer comment: deferred  Ambulation/Gait                 Stairs             Wheelchair Mobility    Modified Rankin (Stroke Patients Only)       Balance                                            Cognition Arousal/Alertness: Awake/alert Behavior During Therapy: WFL for tasks assessed/performed;Flat affect Overall  Cognitive Status: History of cognitive impairments - at baseline                                 General Comments: keeps eyes closed most of session but stated he is awake      Exercises Other Exercises Other Exercises: BLE AAROM 2 x 10 for ankle pumps, heel slides, ab/add and SLR    General Comments        Pertinent Vitals/Pain Pain Assessment: Faces Faces Pain Scale: Hurts little more Pain Location: general stiffness BLE Pain Descriptors / Indicators: Sore;Discomfort;Grimacing Pain Intervention(s): Limited activity within patient's tolerance;Monitored during session;Repositioned    Home Living                      Prior Function            PT Goals (current goals can now be found in the care plan section) Progress towards PT goals: Progressing toward goals    Frequency    Min 2X/week      PT Plan Current plan remains appropriate  Co-evaluation              AM-PAC PT "6 Clicks" Mobility   Outcome Measure  Help needed turning from your back to your side while in a flat bed without using bedrails?: Total Help needed moving from lying on your back to sitting on the side of a flat bed without using bedrails?: Total Help needed moving to and from a bed to a chair (including a wheelchair)?: Total Help needed standing up from a chair using your arms (e.g., wheelchair or bedside chair)?: Total Help needed to walk in hospital room?: Total Help needed climbing 3-5 steps with a railing? : Total 6 Click Score: 6    End of Session   Activity Tolerance: Patient tolerated treatment well;Patient limited by fatigue Patient left: in bed;with call bell/phone within reach;with bed alarm set;with nursing/sitter in room Nurse Communication: Mobility status       Time: 4144-3601 PT Time Calculation (min) (ACUTE ONLY): 24 min  Charges:  $Therapeutic Exercise: 8-22 mins $Therapeutic Activity: 8-22 mins                     Danielle Dess,  PTA 12/15/19, 9:30 AM

## 2019-12-15 NOTE — TOC Progression Note (Signed)
Transition of Care [facility]) - Progression Note    Patient Details  Name: DALONTE HARDAGE MRN: [id number] Date of Birth: 04-24-1946  Transition of [facility]) CM/SW Contact  Allayne Butcher, RN Phone Number: 12/15/2019, 4:05 PM  Clinical Narrative:    Patient's niece, [name], [facility].  [facility] will have a bed available tomorrow.  Insurance authorization has been approved  Auth ID [id number], reference # [id number]- approved for 3 days start date today- next review 4/30.     Expected Discharge Plan: Skilled Nursing Facility Barriers to Discharge: Continued Medical Work up  Expected Discharge Plan and Services Expected Discharge Plan: Skilled Nursing Facility     Post Acute Care Choice: Skilled Nursing Facility Living arrangements for the past 2 months: Single Family Home Expected Discharge Date: 12/15/19                                     Social Determinants of Health (SDOH) Interventions    Readmission Risk Interventions Readmission Risk Prevention Plan 12/15/2019  Transportation Screening Complete  PCP or Specialist Appt within 3-5 Days Complete  HRI or Home Care Consult Complete  Social Work Consult for Recovery Care Planning/Counseling Complete  Palliative Care Screening Not Applicable  Medication Review [facility]) Complete  Some recent data might be hidden

## 2019-12-15 NOTE — TOC Progression Note (Signed)
Transition of Care [facility]) - Progression Note    Patient Details  Name: ARLIE RIKER MRN: [id number] Date of Birth: August 27, 1945  Transition of Care [facility]) CM/SW Contact  Allayne Butcher, RN Phone Number: 12/15/2019, 10:14 AM  Clinical Narrative:     [facility] requested additional PT notes.  Updated PT note from today faxed over to [facility].     Expected Discharge Plan: Skilled Nursing Facility Barriers to Discharge: Continued Medical Work up  Expected Discharge Plan and Services Expected Discharge Plan: Skilled Nursing Facility     Post Acute Care Choice: Skilled Nursing Facility Living arrangements for the past 2 months: Single Family Home                                       Social Determinants of Health (SDOH) Interventions    Readmission Risk Interventions No flowsheet data found.

## 2019-12-15 NOTE — Discharge Summary (Addendum)
Physician Discharge Summary  Javier Robinson ZOX:[id number] DOB: 1945/12/25 DOA: 12/06/2019  [facility]P: [facility]  Admit date: 12/06/2019 Discharge date: 12/16/2019   Discharge summary from 4/28 updated.  No new events.  Patient stable to be discharged to SNF today.  Admitted From: Home Disposition: Skilled nursing facility  Recommendations for Outpatient Follow-up:  Follow up with MD at SNF within 1 week.  Patient is at high risk for aspiration. Follow-up with nephrology in 2 weeks.    Equipment/Devices: As per therapy at the facility  Discharge Condition: Fair CODE STATUS: Full code Diet recommendation: Dysphagia level 1 w/ Nectar liquids; strict aspiration precautions and use of f/u, DRY swallow b/t bites and sips. Pills Crushed in Puree. Monitoring at all meals.  (Fluid restriction to 1500 cc daily)    Discharge Diagnoses:  Principal Problem: Hyponatremia, acute on chronic  Active Problems:   Hypoglycemia, without underlying diabetes mellitus   Hypertensive urgency   Alcohol dependence (HCC)   At high risk for aspiration   Hypomagnesemia   Prolonged QT interval   Malnutrition of moderate degree   Acute metabolic encephalopathy  Brief narrative/HPI 74 y.o.male,with history of heavy alcohol use with chronic hyponatremia, dysphagia,? Dementia, GERD, uncontrolled hypertension history of CHF was brought to the ED for hypoglycemia. Patient was confused on presentation and for EMS he was poorly alert throughout the day and when EMS arrived his CBG was in the 20s. Given D10 initially then given another round of D10 for ongoing hypoglycemia. Admitted for unexplained hypoglycemia and persistent hyponatremia.   Hospital course  Principal problem Acute on chronic hyponatremia Suspect due to ongoing alcohol use.  Did not improve with normotonic saline for few days following admission and required 3% hypertonic saline.  Also added sodium chloride  tablet with fluid restriction.  Patient off hypertonic saline and maintained on sodium chloride tablets.  Levels have improved (131 today). Follow-up with renal in 2 weeks.  Patient will be discharged on salt tablets.  Active problems Hypoglycemia, without underlying diabetes mellitus Suspect due to poor p.o. intake at home.  Required D10 drip for first few days on admission.  Now resolved and CBG stable.  Hypertensive urgency Started on low-dose amlodipine and currently stable.  Hypokalemia Replenished  Alcohol abuse, chronic No signs of withdrawal.  Counseled strongly on cessation.  Continue thiamine, folate and multivitamin.  Prolonged QTC Low electrolytes replenished.  Stable on telemetry.  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  Follow as outpatient.  Aspiration risk with pharyngeal phase dysphagia Seen by speech pathologist.  Patient exhibited both delayed pharyngeal swallowing and inconsistent pharyngeal residue with bolus trials.  He is at high risk for aspiration with pharyngeal residue post swallowing.  Recommended dysphagia level 1 with nectar thick liquid but given episodic choking and further aspiration risk diet has been switched to dysphagia level 1 with honey thick liquid.  Meds should be crushed in pure.  Failure to thrive in adult /moderate protein calorie malnutrition. Seen by PT and recommends SNF.  Added nutrition supplement.  (Nepro liquid with nectar consistency 3 times a day with meals)  BPH Issues with urinary retention on admission.  Added Flomax and currently voiding without difficulty.    Procedure: CT head, ultrasound abdomen  Consults: Nephrology Family communication: None Disposition: Skilled nursing facility  Discharge Instructions   Allergies as of 12/15/2019   No  Known Allergies      Medication List    TAKE these medications   amLODipine 5 MG tablet Commonly known as: NORVASC Take 1 tablet (5 mg total) by mouth daily. Start taking on: December 16, 2019   feeding supplement (Nepro liquid) Liqd with nectar consistency Take 237 mLs by mouth 3 (three) times daily.   folic acid 1 MG tablet Commonly known as: FOLVITE Take 1 tablet (1 mg total) by mouth daily. Start taking on: December 16, 2019   multivitamin with minerals Tabs tablet Take 1 tablet by mouth daily. Start taking on: December 16, 2019   pantoprazole 40 MG tablet Commonly known as: PROTONIX Take 1 tablet (40 mg total) by mouth daily.   sodium chloride 1 g tablet Take 1 tablet (1 g total) by mouth 3 (three) times daily with meals.   tamsulosin 0.4 MG Caps capsule Commonly known as: FLOMAX Take 1 capsule (0.4 mg total) by mouth daily after supper.   thiamine 100 MG tablet Take 1 tablet (100 mg total) by mouth daily. Start taking on: December 16, 2019      Contact information for after-discharge care    Destination    [facility] [place] [facility] .   Service: Skilled Nursing Contact information: [address] [place] [phone]             No Known Allergies    Procedures/Studies: CT HEAD WO CONTRAST  Result Date: 12/06/2019 CLINICAL DATA:  Weakness, confusion EXAM: CT HEAD WITHOUT CONTRAST TECHNIQUE: Contiguous axial images were obtained from the base of the skull through the vertex without intravenous contrast. COMPARISON:  11/11/2019 FINDINGS: Brain: No evidence of acute infarction, hemorrhage, hydrocephalus, extra-axial collection or mass lesion/mass effect. Incidental note of cavum septum pellucidum et vergae variant of the lateral ventricles. Nonacute lacunar infarction of the anterior limb of the left internal capsule and adjacent corona radiata. Periventricular and deep white matter hypodensity with moderate global volume loss. Vascular: No  hyperdense vessel or unexpected calcification. Skull: Normal. Negative for fracture or focal lesion. Sinuses/Orbits: No acute finding. Other: None. IMPRESSION: 1.  No acute intracranial pathology. 2. 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 Electronically Signed   By: Lauralyn PrimesAlex  Bibbey M.D.   On: 12/06/2019 16:40   US Abdomen Complete  Result Date: 12/07/2019 CLINICAL DATA:  Abdominal distension. EXAM: ABDOMEN ULTRASOUND COMPLETE COMPARISON:  None. FINDINGS: Gallbladder: No gallstones or wall thickening visualized. No sonographic Murphy sign noted by sonographer. Common bile duct: Diameter: 8 mm which is within normal limits. Liver: Heterogeneous echotexture of hepatic parenchyma is noted. No focal abnormality is noted. Portal vein is patent on color Doppler imaging with normal direction of blood flow towards the liver. IVC: No abnormality visualized. Pancreas: Not well visualized due to overlying bowel gas. Spleen: Size and appearance within normal limits. Right Kidney: Length: 10.4 cm. Echogenicity within normal limits. No mass or hydronephrosis visualized. Left Kidney: Length: 10.3 cm. Echogenicity within normal limits. No mass or hydronephrosis visualized. Abdominal aorta: No aneurysm visualized. Other findings: Mild urinary bladder distention is noted with calculated volume of 350 mL. IMPRESSION: Heterogeneous echotexture of hepatic parenchyma is noted suggesting possible diffuse hepatocellular disease. Mild urinary bladder distention is noted. Electronically Signed   By: Lupita RaiderJames  Green Jr M.D.   On: 12/07/2019 10:46   DG Chest Port 1 View  Result Date: 12/15/2019 CLINICAL DATA:  Suspected aspiration EXAM: PORTABLE CHEST 1 VIEW COMPARISON:  December 06, 2019  FINDINGS: There is no edema or airspace opacity. Heart is borderline prominent with mild pulmonary venous hypertension. No adenopathy. There is a sclerotic focus in the proximal right humerus, stable.  IMPRESSION: Question mild pulmonary vascular congestion.  Lungs clear. Sclerotic focus in the proximal right humerus, a finding likely representing either infarct or enchondroma. Electronically Signed   By: Bretta Bang III M.D.   On: 12/15/2019 10:57   DG Chest Portable 1 View  Result Date: 12/06/2019 CLINICAL DATA:  Weakness and confusion. Hypoglycemia. EXAM: PORTABLE CHEST 1 VIEW COMPARISON:  11/11/2019 FINDINGS: Mild cardiomegaly. Tortuous aorta. The lungs are clear. The vascularity is normal. No effusions. No acute bone finding. IMPRESSION: No active disease. Mild cardiomegaly. Electronically Signed   By: Paulina Fusi M.D.   On: 12/06/2019 15:15      Subjective: Noted to be choking intermittently following meals.  Chest x-ray done without any infiltrate.  Discharge Exam: Vitals:   12/15/19 0003 12/15/19 0745  BP: (!) 129/54 (!) 141/57  Pulse: 86   Resp: 15 16  Temp: 98.2 F (36.8 C) 99.4 F (37.4 C)  SpO2: 100% 98%   Vitals:   12/14/19 0751 12/14/19 1600 12/15/19 0003 12/15/19 0745  BP: (!) 139/57 (!) 133/55 (!) 129/54 (!) 141/57  Pulse: 82 75 86   Resp: 17 18 15 16   Temp: 99.2 F (37.3 C) 98.9 F (37.2 C) 98.2 F (36.8 C) 99.4 F (37.4 C)  TempSrc: Axillary Axillary Oral Axillary  SpO2: 98% 100% 100% 98%  Weight:      Height:        General: Elderly male not in distress, fatigue HEENT: Moist mucosa, supple neck Chest: Clear bilaterally CVs: Normal S1-S2 GI: Soft, tense abdomen, nondistended, Musculoskeletal: Warm, no edema CNS: Alert and oriented x2   The results of significant diagnostics from this hospitalization (including imaging, microbiology, ancillary and laboratory) are listed below for reference.     Microbiology: Recent Results (from the past 240 hour(s))  SARS CORONAVIRUS 2 (TAT 6-24 HRS) Nasopharyngeal Nasopharyngeal Swab     Status: None   Collection Time: 12/06/19  4:01 PM   Specimen: Nasopharyngeal Swab  Result Value Ref Range Status    SARS Coronavirus 2 NEGATIVE NEGATIVE Final    Comment: (NOTE) SARS-CoV-2 target nucleic acids are NOT DETECTED. The SARS-CoV-2 RNA is generally detectable in upper and lower respiratory specimens during the acute phase of infection. Negative results do not preclude SARS-CoV-2 infection, do not rule out co-infections with other pathogens, and should not be used as the sole basis for treatment or other patient management decisions. Negative results must be combined with clinical observations, patient history, and epidemiological information. The expected result is Negative. Fact Sheet for Patients: 12/08/19 Fact Sheet for Healthcare Providers: HairSlick.no This test is not yet approved or cleared by the [place] FDA and  has been authorized for detection and/or diagnosis of SARS-CoV-2 by FDA under an Emergency Use Authorization (EUA). This EUA will remain  in effect (meaning this test can be used) for the duration of the COVID-19 declaration under Section 56 4(b)(1) of the Act, 21 U.S.C. section 360bbb-3(b)(1), unless the authorization is terminated or revoked sooner. Performed at [facility], [address], [place], [place]   SARS CORONAVIRUS 2 (TAT 6-24 HRS) Nasopharyngeal Nasopharyngeal Swab     Status: None   Collection Time: 12/14/19 11:38 AM   Specimen: Nasopharyngeal Swab  Result Value Ref Range Status   SARS Coronavirus 2 NEGATIVE NEGATIVE Final  Comment: (NOTE) SARS-CoV-2 target nucleic acids are NOT DETECTED. The SARS-CoV-2 RNA is generally detectable in upper and lower respiratory specimens during the acute phase of infection. Negative results do not preclude SARS-CoV-2 infection, do not rule out co-infections with other pathogens, and should not be used as the sole basis for treatment or other patient management decisions. Negative results must be combined with clinical  observations, patient history, and epidemiological information. The expected result is Negative. Fact Sheet for Patients: HairSlick.no Fact Sheet for Healthcare Providers: quierodirigir.com This test is not yet approved or cleared by the [place] FDA and  has been authorized for detection and/or diagnosis of SARS-CoV-2 by FDA under an Emergency Use Authorization (EUA). This EUA will remain  in effect (meaning this test can be used) for the duration of the COVID-19 declaration under Section 56 4(b)(1) of the Act, 21 U.S.C. section 360bbb-3(b)(1), unless the authorization is terminated or revoked sooner. Performed at [facility], [address] [place], [place] [address]      Labs: BNP (last 3 results) Recent Labs    08/12/19 1239 11/11/19 1251  BNP 105.0* 59.0   Basic Metabolic Panel: Recent Labs  Lab 12/11/19 0130 12/11/19 0925 12/12/19 0208 12/12/19 0530 12/13/19 0215 12/13/19 0547 12/13/19 0900 12/13/19 1105 12/13/19 1740 12/14/19 0540 12/15/19 0511  NA 125*   < > 122*  123*   < > 129*   < > 128* 131* 133* 134* 131*  K 4.0  --  3.5  --  3.9  --   --   --   --  4.0 3.7  CL 97*  --  92*  --  99  --   --   --   --  103 98  CO2 25  --  23  --  25  --   --   --   --  25 26  GLUCOSE 92  --  80  --  100*  --   --   --   --  93 90  BUN 6*  --  <5*  --  8  --   --   --   --  12 12  CREATININE 0.40*  --  0.38*  --  0.45*  --   --   --   --  0.44* 0.50*  CALCIUM 8.3*  --  8.3*  --  8.6*  --   --   --   --  8.8* 8.7*   < > = values in this interval not displayed.   Liver Function Tests: No results for input(s): AST, ALT, ALKPHOS, BILITOT, PROT, ALBUMIN in the last 168 hours. No results for input(s): LIPASE, AMYLASE in the last 168 hours. No results for input(s): AMMONIA in the last 168 hours. CBC: Recent Labs  Lab 12/11/19 0130 12/12/19 0208 12/13/19 0215 12/14/19 0540 12/15/19 0511  WBC 4.9 4.2  4.8 5.6 6.5  HGB 8.2* 8.5* 8.3* 8.2* 8.1*  HCT 24.7* 23.8* 24.7* 24.0* 24.2*  MCV 90.1 85.6 89.5 88.2 90.6  PLT 225 246 241 237 274   Cardiac Enzymes: No results for input(s): CKTOTAL, CKMB, CKMBINDEX, TROPONINI in the last 168 hours. BNP: Invalid input(s): POCBNP CBG: Recent Labs  Lab 12/14/19 1131 12/14/19 1727 12/14/19 2058 12/15/19 0745 12/15/19 1127  GLUCAP 121* 121* 96 80 80   D-Dimer No results for input(s): DDIMER in the last 72 hours. Hgb A1c No results for input(s): HGBA1C in the last 72 hours. Lipid Profile No results  for input(s): CHOL, HDL, LDLCALC, TRIG, CHOLHDL, LDLDIRECT in the last 72 hours. Thyroid function studies No results for input(s): TSH, T4TOTAL, T3FREE, THYROIDAB in the last 72 hours.  Invalid input(s): FREET3 Anemia work up No results for input(s): VITAMINB12, FOLATE, FERRITIN, TIBC, IRON, RETICCTPCT in the last 72 hours. Urinalysis    Component Value Date/Time   COLORURINE YELLOW (A) 12/06/2019 1429   APPEARANCEUR CLEAR (A) 12/06/2019 1429   LABSPEC 1.011 12/06/2019 1429   PHURINE 6.0 12/06/2019 1429   GLUCOSEU 150 (A) 12/06/2019 1429   HGBUR NEGATIVE 12/06/2019 1429   BILIRUBINUR NEGATIVE 12/06/2019 1429   KETONESUR NEGATIVE 12/06/2019 1429   PROTEINUR NEGATIVE 12/06/2019 1429   UROBILINOGEN 0.2 02/18/2014 1720   NITRITE NEGATIVE 12/06/2019 1429   LEUKOCYTESUR NEGATIVE 12/06/2019 1429   Sepsis Labs Invalid input(s): PROCALCITONIN,  WBC,  LACTICIDVEN Microbiology Recent Results (from the past 240 hour(s))  SARS CORONAVIRUS 2 (TAT 6-24 HRS) Nasopharyngeal Nasopharyngeal Swab     Status: None   Collection Time: 12/06/19  4:01 PM   Specimen: Nasopharyngeal Swab  Result Value Ref Range Status   SARS Coronavirus 2 NEGATIVE NEGATIVE Final    Comment: (NOTE) SARS-CoV-2 target nucleic acids are NOT DETECTED. The SARS-CoV-2 RNA is generally detectable in upper and lower respiratory specimens during the acute phase of infection.  Negative results do not preclude SARS-CoV-2 infection, do not rule out co-infections with other pathogens, and should not be used as the sole basis for treatment or other patient management decisions. Negative results must be combined with clinical observations, patient history, and epidemiological information. The expected result is Negative. Fact Sheet for Patients: SugarRoll.be Fact Sheet for Healthcare Providers: https://www.woods-mathews.com/ This test is not yet approved or cleared by the [place] FDA and  has been authorized for detection and/or diagnosis of SARS-CoV-2 by FDA under an Emergency Use Authorization (EUA). This EUA will remain  in effect (meaning this test can be used) for the duration of the COVID-19 declaration under Section 56 4(b)(1) of the Act, 21 U.S.C. section 360bbb-3(b)(1), unless the authorization is terminated or revoked sooner. Performed at [facility], [place] [address] [place], [place] [address]   SARS CORONAVIRUS 2 (TAT 6-24 HRS) Nasopharyngeal Nasopharyngeal Swab     Status: None   Collection Time: 12/14/19 11:38 AM   Specimen: Nasopharyngeal Swab  Result Value Ref Range Status   SARS Coronavirus 2 NEGATIVE NEGATIVE Final    Comment: (NOTE) SARS-CoV-2 target nucleic acids are NOT DETECTED. The SARS-CoV-2 RNA is generally detectable in upper and lower respiratory specimens during the acute phase of infection. Negative results do not preclude SARS-CoV-2 infection, do not rule out co-infections with other pathogens, and should not be used as the sole basis for treatment or other patient management decisions. Negative results must be combined with clinical observations, patient history, and epidemiological information. The expected result is Negative. Fact Sheet for Patients: SugarRoll.be Fact Sheet for Healthcare  Providers: https://www.woods-mathews.com/ This test is not yet approved or cleared by the [place] FDA and  has been authorized for detection and/or diagnosis of SARS-CoV-2 by FDA under an Emergency Use Authorization (EUA). This EUA will remain  in effect (meaning this test can be used) for the duration of the COVID-19 declaration under Section 56 4(b)(1) of the Act, 21 U.S.C. section 360bbb-3(b)(1), unless the authorization is terminated or revoked sooner. Performed at [facility], [place] [address] [place], [place] [address]      Time coordinating discharge: 35 minutes  SIGNED:  Eddie North, MD  [facility] 12/15/2019, 2:08 PM Pager   If 7PM-7AM, please contact night-coverage [email or web address] Password [email or web address]

## 2019-12-15 NOTE — TOC Progression Note (Signed)
Transition of Care [facility]) - Progression Note    Patient Details  Name: OLAJUWON FOSDICK MRN: [id number] Date of Birth: 10/05/1945  Transition of Care [facility]) CM/SW Contact  Allayne Butcher, RN Phone Number: 12/15/2019, 1:18 PM  Clinical Narrative:    [facility] is unable to offer a bed at this time but the patient has bed offers from [facility] and [facility].  Patient's niece Elita Quick will talk with the patient's sister to see which facility she prefers.  Insurance authorization is still pending.    Expected Discharge Plan: Skilled Nursing Facility Barriers to Discharge: Continued Medical Work up  Expected Discharge Plan and Services Expected Discharge Plan: Skilled Nursing Facility     Post Acute Care Choice: Skilled Nursing Facility Living arrangements for the past 2 months: Single Family Home                                       Social Determinants of Health (SDOH) Interventions    Readmission Risk Interventions Readmission Risk Prevention Plan 12/15/2019  Transportation Screening Complete  PCP or Specialist Appt within 3-5 Days Complete  HRI or Home Care Consult Complete  Social Work Consult for Recovery Care Planning/Counseling Complete  Palliative Care Screening Not Applicable  Medication Review Oceanographer) Complete  Some recent data might be hidden

## 2019-12-16 LAB — BASIC METABOLIC PANEL
Anion gap: 6 (ref 5–15)
BUN: 12 mg/dL (ref 8–23)
CO2: 27 mmol/L (ref 22–32)
Calcium: 9 mg/dL (ref 8.9–10.3)
Chloride: 97 mmol/L — ABNORMAL LOW (ref 98–111)
Creatinine, Ser: 0.4 mg/dL — ABNORMAL LOW (ref 0.61–1.24)
GFR calc Af Amer: >60 mL/min (ref >60)
GFR calc non Af Amer: >60 mL/min (ref >60)
Glucose, Bld: 83 mg/dL (ref 70–99)
Potassium: 4 mmol/L (ref 3.5–5.1)
Sodium: 130 mmol/L — ABNORMAL LOW (ref 135–145)

## 2019-12-16 LAB — GLUCOSE, CAPILLARY
Glucose-Capillary: 79 mg/dL (ref 70–99)
Glucose-Capillary: 76 mg/dL (ref 70–99)

## 2019-12-16 NOTE — TOC Transition Note (Signed)
Transition of Care [facility]) - CM/SW Discharge Note   Patient Details  Name: Javier Robinson MRN: [id number] Date of Birth: 11/03/1945  Transition of Care [facility]) CM/SW Contact:  Allayne Butcher, RN Phone Number: 12/16/2019, 11:29 AM   Clinical Narrative:    [facility].  Patient is going to room 37A, bedside RN will call report to [phone].  This RNCM will arrange EMS transport.  Family updated on discharge.    Final next level of care: Skilled Nursing Facility Barriers to Discharge: Barriers Resolved   Patient Goals and CMS Choice Patient states their goals for this hospitalization and ongoing recovery are:: Patient is agreeable to SNF and would like to return to [facility] CMS Medicare.gov Compare Post Acute Care list provided to:: Patient Choice offered to / list presented to : Patient, Sibling  Discharge Placement              Patient chooses bed at: [facility] Patient to be transferred to facility by: [facility] Name of family member notified: Chauncey Cruel Patient and family notified of of transfer: 12/16/19  Discharge Plan and Services     Post Acute Care Choice: Skilled Nursing Facility                               Social Determinants of Health (SDOH) Interventions     Readmission Risk Interventions Readmission Risk Prevention Plan 12/15/2019  Transportation Screening Complete  PCP or Specialist Appt within 3-5 Days Complete  HRI or Home Care Consult Complete  Social Work Consult for Recovery Care Planning/Counseling Complete  Palliative Care Screening Not Applicable  Medication Review Oceanographer) Complete  Some recent data might be hidden

## 2019-12-16 NOTE — TOC Progression Note (Signed)
Transition of Care [facility]) - Progression Note    Patient Details  Name: CAEDMON LOUQUE MRN: [id number] Date of Birth: 11/15/1945  Transition of Care [facility]) CM/SW Contact  Allayne Butcher, RN Phone Number: 12/16/2019, 9:04 AM  Clinical Narrative:    Authorization ID updated for patient going to [facility]- [id number], start date 4/28 next review 4/30.     Expected Discharge Plan: Skilled Nursing Facility Barriers to Discharge: Continued Medical Work up  Expected Discharge Plan and Services Expected Discharge Plan: Skilled Nursing Facility     Post Acute Care Choice: Skilled Nursing Facility Living arrangements for the past 2 months: Single Family Home Expected Discharge Date: 12/15/19                                     Social Determinants of Health (SDOH) Interventions    Readmission Risk Interventions Readmission Risk Prevention Plan 12/15/2019  Transportation Screening Complete  PCP or Specialist Appt within 3-5 Days Complete  HRI or Home Care Consult Complete  Social Work Consult for Recovery Care Planning/Counseling Complete  Palliative Care Screening Not Applicable  Medication Review Oceanographer) Complete  Some recent data might be hidden

## 2019-12-16 NOTE — Progress Notes (Signed)
Report called to Sharman Crate, LPN at [facility]. Facility is ready to receive patient.

## 2019-12-16 NOTE — NC FL2 (Signed)
[place] LEVEL OF CARE SCREENING TOOL     IDENTIFICATION  Patient Name: Javier Robinson Birthdate: 1946/04/14 Sex: male Admission Date (Current Location): 12/06/2019  [place] and [place] Number:  Selena Lesser [id number] [facility] Facility and Address:  [facility], [address] [place], [place] [address]      Provider Number: [id number]  Attending Physician Name and Address:  Louellen Molder, MD  Relative Name and Phone Number:  [name] - niece [phone]    Current Level of Care: Hospital Recommended Level of Care: [place] Prior Approval Number:    Date Approved/Denied:   PASRR Number: [id number]  Discharge Plan: SNF    Current Diagnoses: Patient Active Problem List   Diagnosis Date Noted  . Acute metabolic encephalopathy 43/32/9518  . At high risk for aspiration 12/15/2019  . Pressure injury of skin 11/15/2019  . Protein-calorie malnutrition, severe 11/13/2019  . Malnutrition of moderate degree 08/10/2019  . Alcohol dependence ([place]) 08/09/2019  . Essential hypertension 08/09/2019  . Metabolic acidosis, increased anion gap (IAG) 08/09/2019  . Prolonged QT interval 08/09/2019  . Hypertensive urgency 07/03/2019  . Acute CHF (congestive heart failure) ([place]) 06/30/2019  . Hypoglycemia 06/30/2019  . SIRS (systemic inflammatory response syndrome) ([place]) 06/30/2019  . Hyponatremia 06/30/2019  . Hypomagnesemia 06/30/2019    Orientation RESPIRATION BLADDER Height & Weight     Self, Place  Normal External catheter, Incontinent Weight: 71.7 kg Height:  5\' 8"  (172.7 cm)  BEHAVIORAL SYMPTOMS/MOOD NEUROLOGICAL BOWEL NUTRITION STATUS      Incontinent Diet(see discharge summary)  AMBULATORY STATUS COMMUNICATION OF NEEDS Skin   Extensive Assist Verbally Normal                       Personal Care Assistance Level of Assistance  Bathing, Feeding, Total care Bathing Assistance: Maximum assistance Feeding assistance:  Maximum assistance Dressing Assistance: Maximum assistance     Functional Limitations Info    Sight Info: Adequate Hearing Info: Adequate Speech Info: Adequate    SPECIAL CARE FACTORS FREQUENCY  PT (By licensed PT), OT (By licensed OT)     PT Frequency: 5 times per week OT Frequency: 5 times per week            Contractures Contractures Info: Not present    Additional Factors Info  Code Status, Allergies Code Status Info: Full Allergies Info: NKA           Current Medications (12/16/2019):  This is the current hospital active medication list Current Facility-Administered Medications  Medication Dose Route Frequency Provider Last Rate Last Admin  . acetaminophen (TYLENOL) tablet 650 mg  650 mg Oral Q6H PRN Dhungel, Nishant, MD   650 mg at 12/08/19 0350   Or  . acetaminophen (TYLENOL) suppository 650 mg  650 mg Rectal Q6H PRN Dhungel, Nishant, MD      . amLODipine (NORVASC) tablet 5 mg  5 mg Oral Daily Dhungel, Nishant, MD   5 mg at 12/16/19 0939  . benzonatate (TESSALON) capsule 200 mg  200 mg Oral TID PRN Wyvonnia Dusky, MD      . enoxaparin (LOVENOX) injection 40 mg  40 mg Subcutaneous Q24H Dhungel, Nishant, MD   40 mg at 12/15/19 1228  . feeding supplement (ENSURE ENLIVE) (ENSURE ENLIVE) liquid 237 mL  237 mL Oral TID Wyvonnia Dusky, MD   237 mL at 12/16/19 0937  . folic acid (FOLVITE) tablet 1 mg  1 mg Oral Daily Dhungel, Nishant, MD  1 mg at 12/16/19 0937  . hydrALAZINE (APRESOLINE) injection 10 mg  10 mg Intravenous Q6H PRN Dhungel, Nishant, MD   10 mg at 12/08/19 0420  . ipratropium-albuterol (DUONEB) 0.5-2.5 (3) MG/3ML nebulizer solution 3 mL  3 mL Nebulization Q4H PRN Charise Killian, MD      . multivitamin with minerals tablet 1 tablet  1 tablet Oral Daily Dhungel, Nishant, MD   1 tablet at 12/16/19 0937  . pantoprazole (PROTONIX) EC tablet 40 mg  40 mg Oral Daily Dhungel, Nishant, MD   40 mg at 12/16/19 0937  . sodium chloride tablet 1 g  1 g  Oral TID WC Lateef, Munsoor, MD   1 g at 12/16/19 0937  . tamsulosin (FLOMAX) capsule 0.4 mg  0.4 mg Oral QPC supper Dhungel, Nishant, MD   0.4 mg at 12/15/19 1730  . thiamine tablet 100 mg  100 mg Oral Daily Dhungel, Nishant, MD   100 mg at 12/16/19 0156   Or  . thiamine (B-1) injection 100 mg  100 mg Intravenous Daily Dhungel, Nishant, MD   100 mg at 12/10/19 1132     Discharge Medications: Please see discharge summary for a list of discharge medications.  Relevant Imaging Results:  Relevant Lab Results:   Additional Information SS# [id number]  Allayne Butcher, RN

## 2019-12-16 NOTE — TOC Transition Note (Signed)
Transition of Care [facility]) - CM/SW Discharge Note   Patient Details  Name: Javier Robinson MRN: [id number] Date of Birth: 1945/11/27  Transition of Care [facility]) CM/SW Contact:  Allayne Butcher, RN Phone Number: 12/16/2019, 11:59 AM   Clinical Narrative:    Patient medically cleared for discharge to [facility] today.  Patient will go to room 37A.  Bedside RN called report to the facility.  This RNCM has arranged [facility] transport to come after 1230 so patient can eat lunch before he goes.  Family updated on discharge.    Final next level of care: Skilled Nursing Facility Barriers to Discharge: Barriers Resolved   Patient Goals and CMS Choice Patient states their goals for this hospitalization and ongoing recovery are:: Patient is agreeable to SNF and would like to return to [facility] CMS Medicare.gov Compare Post Acute Care list provided to:: Patient Choice offered to / list presented to : Patient, Sibling  Discharge Placement              Patient chooses bed at: [facility] Patient to be transferred to facility by: [facility] Name of family member notified: Chauncey Cruel Patient and family notified of of transfer: 12/16/19  Discharge Plan and Services     Post Acute Care Choice: Skilled Nursing Facility                               Social Determinants of Health (SDOH) Interventions     Readmission Risk Interventions Readmission Risk Prevention Plan 12/15/2019  Transportation Screening Complete  PCP or Specialist Appt within 3-5 Days Complete  HRI or Home Care Consult Complete  Social Work Consult for Recovery Care Planning/Counseling Complete  Palliative Care Screening Not Applicable  Medication Review [employer]) Complete  Some recent data might be hidden

## 2019-12-16 NOTE — Progress Notes (Signed)
  Patient seen and examined this morning.  After diet was changed to dysphagia level 1 with nectar liquids he has been able to swallow better and no further issues with choking or coughing documented.  Patient remains alert and oriented.  Vital stable.  Physical exam unchanged from yesterday.  Labs reviewed sodium of 130 this morning.   Assessment and plan Hyponatremia Stable with sodium tablets and fluid restriction.  Sodium of 130 today.  Continue sodium tablet upon discharge and outpatient renal follow-up.  Essential hypertension Stable on amlodipine.  Dysphagia Severe pharyngeal phase dysphagia.  Now on dysphagia level 1 with nectar thick liquid.  Stable for discharge to SNF.  COVID-19 PCR done on 4/28 -.

## 2020-03-24 ENCOUNTER — Other Ambulatory Visit: Payer: Self-pay

## 2020-03-24 ENCOUNTER — Emergency Department: Payer: Medicare Other

## 2020-03-24 ENCOUNTER — Inpatient Hospital Stay
Admission: EM | Admit: 2020-03-24 | Discharge: 2020-03-29 | DRG: 871 | Disposition: A | Discharge status: Skilled Nursing Facility | Payer: Medicare Other | Attending: Internal Medicine | Admitting: Internal Medicine

## 2020-03-24 ENCOUNTER — Encounter: Payer: Self-pay | Admitting: Emergency Medicine

## 2020-03-24 DIAGNOSIS — K219 Gastro-esophageal reflux disease without esophagitis: Secondary | ICD-10-CM | POA: Diagnosis present

## 2020-03-24 DIAGNOSIS — F17210 Nicotine dependence, cigarettes, uncomplicated: Secondary | ICD-10-CM | POA: Diagnosis present

## 2020-03-24 DIAGNOSIS — Z20822 Contact with and (suspected) exposure to covid-19: Secondary | ICD-10-CM | POA: Diagnosis present

## 2020-03-24 DIAGNOSIS — M1990 Unspecified osteoarthritis, unspecified site: Secondary | ICD-10-CM | POA: Diagnosis present

## 2020-03-24 DIAGNOSIS — E872 Acidosis: Secondary | ICD-10-CM | POA: Diagnosis present

## 2020-03-24 DIAGNOSIS — R6520 Severe sepsis without septic shock: Secondary | ICD-10-CM

## 2020-03-24 DIAGNOSIS — G9340 Encephalopathy, unspecified: Secondary | ICD-10-CM | POA: Diagnosis not present

## 2020-03-24 DIAGNOSIS — L89319 Pressure ulcer of right buttock, unspecified stage: Secondary | ICD-10-CM | POA: Diagnosis not present

## 2020-03-24 DIAGNOSIS — S31819A Unspecified open wound of right buttock, initial encounter: Secondary | ICD-10-CM | POA: Diagnosis not present

## 2020-03-24 DIAGNOSIS — Z96651 Presence of right artificial knee joint: Secondary | ICD-10-CM | POA: Diagnosis present

## 2020-03-24 DIAGNOSIS — L03317 Cellulitis of buttock: Secondary | ICD-10-CM | POA: Diagnosis present

## 2020-03-24 DIAGNOSIS — Z79899 Other long term (current) drug therapy: Secondary | ICD-10-CM

## 2020-03-24 DIAGNOSIS — Z66 Do not resuscitate: Secondary | ICD-10-CM | POA: Diagnosis present

## 2020-03-24 DIAGNOSIS — D509 Iron deficiency anemia, unspecified: Secondary | ICD-10-CM | POA: Diagnosis present

## 2020-03-24 DIAGNOSIS — L89153 Pressure ulcer of sacral region, stage 3: Secondary | ICD-10-CM | POA: Diagnosis present

## 2020-03-24 DIAGNOSIS — L8990 Pressure ulcer of unspecified site, unspecified stage: Secondary | ICD-10-CM | POA: Diagnosis present

## 2020-03-24 DIAGNOSIS — I10 Essential (primary) hypertension: Secondary | ICD-10-CM | POA: Diagnosis present

## 2020-03-24 DIAGNOSIS — A419 Sepsis, unspecified organism: Principal | ICD-10-CM | POA: Diagnosis present

## 2020-03-24 DIAGNOSIS — E876 Hypokalemia: Secondary | ICD-10-CM | POA: Diagnosis present

## 2020-03-24 DIAGNOSIS — F1010 Alcohol abuse, uncomplicated: Secondary | ICD-10-CM | POA: Diagnosis present

## 2020-03-24 DIAGNOSIS — G9341 Metabolic encephalopathy: Secondary | ICD-10-CM | POA: Diagnosis present

## 2020-03-24 LAB — CBC WITH DIFFERENTIAL/PLATELET
Abs Immature Granulocytes: 0.08 10*3/uL — ABNORMAL HIGH (ref 0.00–0.07)
Basophils Absolute: 0.1 10*3/uL (ref 0.0–0.1)
Basophils Relative: 0 %
Eosinophils Absolute: 0.1 10*3/uL (ref 0.0–0.5)
Eosinophils Relative: 0 %
HCT: 29.3 % — ABNORMAL LOW (ref 39.0–52.0)
Hemoglobin: 9.7 g/dL — ABNORMAL LOW (ref 13.0–17.0)
Immature Granulocytes: 1 %
Lymphocytes Relative: 12 %
Lymphs Abs: 1.7 10*3/uL (ref 0.7–4.0)
MCH: 27.8 pg (ref 26.0–34.0)
MCHC: 33.1 g/dL (ref 30.0–36.0)
MCV: 84 fL (ref 80.0–100.0)
Monocytes Absolute: 1 10*3/uL (ref 0.1–1.0)
Monocytes Relative: 7 %
Neutro Abs: 12.1 10*3/uL — ABNORMAL HIGH (ref 1.7–7.7)
Neutrophils Relative %: 80 %
Platelets: 391 10*3/uL (ref 150–400)
RBC: 3.49 MIL/uL — ABNORMAL LOW (ref 4.22–5.81)
RDW: 17.8 % — ABNORMAL HIGH (ref 11.5–15.5)
WBC: 15 10*3/uL — ABNORMAL HIGH (ref 4.0–10.5)
nRBC: 0 % (ref 0.0–0.2)

## 2020-03-24 LAB — URINALYSIS, COMPLETE (UACMP) WITH MICROSCOPIC
Bilirubin Urine: NEGATIVE
Glucose, UA: NEGATIVE mg/dL
Ketones, ur: 5 mg/dL — AB
Nitrite: NEGATIVE
Protein, ur: 30 mg/dL — AB
Specific Gravity, Urine: 1.025 (ref 1.005–1.030)
pH: 6 (ref 5.0–8.0)

## 2020-03-24 LAB — COMPREHENSIVE METABOLIC PANEL
ALT: 10 U/L (ref 0–44)
AST: 25 U/L (ref 15–41)
Albumin: 3 g/dL — ABNORMAL LOW (ref 3.5–5.0)
Alkaline Phosphatase: 64 U/L (ref 38–126)
Anion gap: 13 (ref 5–15)
BUN: 10 mg/dL (ref 8–23)
CO2: 24 mmol/L (ref 22–32)
Calcium: 8.4 mg/dL — ABNORMAL LOW (ref 8.9–10.3)
Chloride: 105 mmol/L (ref 98–111)
Creatinine, Ser: 0.7 mg/dL (ref 0.61–1.24)
GFR calc Af Amer: >60 mL/min (ref >60)
GFR calc non Af Amer: >60 mL/min (ref >60)
Glucose, Bld: 112 mg/dL — ABNORMAL HIGH (ref 70–99)
Potassium: 3.3 mmol/L — ABNORMAL LOW (ref 3.5–5.1)
Sodium: 142 mmol/L (ref 135–145)
Total Bilirubin: 1.4 mg/dL — ABNORMAL HIGH (ref 0.3–1.2)
Total Protein: 8.4 g/dL — ABNORMAL HIGH (ref 6.5–8.1)

## 2020-03-24 LAB — APTT: aPTT: 36 seconds (ref 24–36)

## 2020-03-24 LAB — TROPONIN I (HIGH SENSITIVITY)
Troponin I (High Sensitivity): 9 ng/L (ref <18)
Troponin I (High Sensitivity): 10 ng/L (ref <18)

## 2020-03-24 LAB — SARS CORONAVIRUS 2 BY RT PCR (HOSPITAL ORDER, PERFORMED IN ~~LOC~~ HOSPITAL LAB): SARS Coronavirus 2: NEGATIVE

## 2020-03-24 LAB — PROTIME-INR
INR: 1.1 (ref 0.8–1.2)
Prothrombin Time: 14.2 seconds (ref 11.4–15.2)

## 2020-03-24 LAB — LACTIC ACID, PLASMA
Lactic Acid, Venous: 2.3 mmol/L (ref 0.5–1.9)
Lactic Acid, Venous: 2 mmol/L (ref 0.5–1.9)

## 2020-03-24 MED ORDER — SODIUM CHLORIDE 0.9% FLUSH
3.0000 mL | Freq: Once | INTRAVENOUS | Status: AC
  Administered 2020-03-27: 3 mL via INTRAVENOUS

## 2020-03-24 MED ORDER — SODIUM CHLORIDE 0.9 % IV BOLUS
1000.0000 mL | Freq: Once | INTRAVENOUS | Status: AC
  Administered 2020-03-24: 1000 mL via INTRAVENOUS

## 2020-03-24 MED ORDER — METRONIDAZOLE IN NACL 5-0.79 MG/ML-% IV SOLN
500.0000 mg | Freq: Three times a day (TID) | INTRAVENOUS | Status: DC
  Administered 2020-03-24 – 2020-03-25 (×2): 500 mg via INTRAVENOUS
  Filled 2020-03-24 (×4): qty 100

## 2020-03-24 MED ORDER — ALBUTEROL SULFATE (2.5 MG/3ML) 0.083% IN NEBU: 2.5000 mg | INHALATION_SOLUTION | RESPIRATORY_TRACT | Status: DC | PRN

## 2020-03-24 MED ORDER — SODIUM CHLORIDE 0.9 % IV SOLN
2.0000 g | Freq: Once | INTRAVENOUS | Status: AC
  Administered 2020-03-24: 2 g via INTRAVENOUS
  Filled 2020-03-24: qty 2

## 2020-03-24 MED ORDER — ACETAMINOPHEN 650 MG RE SUPP: 650.0000 mg | Freq: Four times a day (QID) | RECTAL | Status: DC | PRN

## 2020-03-24 MED ORDER — HYDROCODONE-ACETAMINOPHEN 5-325 MG PO TABS: 1.0000 | ORAL_TABLET | ORAL | Status: DC | PRN

## 2020-03-24 MED ORDER — METRONIDAZOLE IN NACL 5-0.79 MG/ML-% IV SOLN
500.0000 mg | Freq: Once | INTRAVENOUS | Status: AC
  Administered 2020-03-24: 500 mg via INTRAVENOUS
  Filled 2020-03-24: qty 100

## 2020-03-24 MED ORDER — VANCOMYCIN HCL 750 MG/150ML IV SOLN
750.0000 mg | Freq: Two times a day (BID) | INTRAVENOUS | Status: DC
  Administered 2020-03-25 – 2020-03-27 (×5): 750 mg via INTRAVENOUS
  Filled 2020-03-24 (×6): qty 150

## 2020-03-24 MED ORDER — NICOTINE 14 MG/24HR TD PT24
14.0000 mg | MEDICATED_PATCH | Freq: Every day | TRANSDERMAL | Status: DC
  Administered 2020-03-25 – 2020-03-29 (×5): 14 mg via TRANSDERMAL
  Filled 2020-03-24 (×6): qty 1

## 2020-03-24 MED ORDER — ONDANSETRON HCL 4 MG/2ML IJ SOLN: 4.0000 mg | Freq: Four times a day (QID) | INTRAMUSCULAR | Status: DC | PRN

## 2020-03-24 MED ORDER — VANCOMYCIN HCL IN DEXTROSE 1-5 GM/200ML-% IV SOLN: 1000.0000 mg | Freq: Once | INTRAVENOUS | Status: DC

## 2020-03-24 MED ORDER — ENOXAPARIN SODIUM 40 MG/0.4ML ~~LOC~~ SOLN
40.0000 mg | SUBCUTANEOUS | Status: DC
  Administered 2020-03-25 – 2020-03-29 (×5): 40 mg via SUBCUTANEOUS
  Filled 2020-03-24 (×5): qty 0.4

## 2020-03-24 MED ORDER — SODIUM CHLORIDE 0.9 % IV SOLN
INTRAVENOUS | Status: DC | PRN
  Administered 2020-03-24 – 2020-03-26 (×4): 250 mL via INTRAVENOUS

## 2020-03-24 MED ORDER — ONDANSETRON HCL 4 MG PO TABS: 4.0000 mg | ORAL_TABLET | Freq: Four times a day (QID) | ORAL | Status: DC | PRN

## 2020-03-24 MED ORDER — SODIUM CHLORIDE 0.9% FLUSH
3.0000 mL | Freq: Two times a day (BID) | INTRAVENOUS | Status: DC
  Administered 2020-03-24 – 2020-03-29 (×9): 3 mL via INTRAVENOUS

## 2020-03-24 MED ORDER — VANCOMYCIN HCL 1250 MG/250ML IV SOLN
1250.0000 mg | Freq: Once | INTRAVENOUS | Status: AC
  Administered 2020-03-24: 1250 mg via INTRAVENOUS
  Filled 2020-03-24: qty 250

## 2020-03-24 MED ORDER — SODIUM CHLORIDE 0.9 % IV SOLN
2.0000 g | Freq: Three times a day (TID) | INTRAVENOUS | Status: DC
  Administered 2020-03-24 – 2020-03-27 (×7): 2 g via INTRAVENOUS
  Filled 2020-03-24 (×10): qty 2

## 2020-03-24 MED ORDER — ACETAMINOPHEN 325 MG PO TABS: 650.0000 mg | ORAL_TABLET | Freq: Four times a day (QID) | ORAL | Status: DC | PRN

## 2020-03-24 MED ORDER — LACTATED RINGERS IV SOLN: INTRAVENOUS | Status: AC

## 2020-03-24 MED ORDER — IOHEXOL 300 MG/ML  SOLN
100.0000 mL | Freq: Once | INTRAMUSCULAR | Status: AC | PRN
  Administered 2020-03-24: 75 mL via INTRAVENOUS

## 2020-03-24 MED ORDER — SENNOSIDES-DOCUSATE SODIUM 8.6-50 MG PO TABS: 1.0000 | ORAL_TABLET | Freq: Every evening | ORAL | Status: DC | PRN

## 2020-03-24 MED ORDER — LACTATED RINGERS IV BOLUS (SEPSIS)
1000.0000 mL | Freq: Once | INTRAVENOUS | Status: DC
  Administered 2020-03-24: 1000 mL via INTRAVENOUS

## 2020-03-24 NOTE — Consult Note (Signed)
PHARMACY -  BRIEF ANTIBIOTIC NOTE   Pharmacy has received consult(s) for vancomycin and cefepime from an ED provider.  The patient's profile has been reviewed for ht/wt/allergies/indication/available labs.    One time order(s) placed for:  1) vancomycin 1250 mg IV x 1  2) cefepime 2 grams IV x 1   Further antibiotics/pharmacy consults should be ordered by admitting physician if indicated.                       Thank you, Lowella Bandy 03/24/2020  1:17 PM
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
## 2020-03-24 NOTE — ED Notes (Signed)
This RN tried to contact pt niece, Rinaldo Cloud [phone]. Unable to reach her at this time.

## 2020-03-24 NOTE — ED Notes (Signed)
RN able to only obtain 1 set of blood cultures at this time. Pt difficult stick. MD made aware. Antibiotics will be started as to not delay care.

## 2020-03-24 NOTE — ED Triage Notes (Signed)
First RN Note: pt presents to ED via [facility] from home with c/o weakness. Per EMS caregiver reports weak and not eating since yesterday, hx of stroke, dementia, HTN, and irregular HR. Per EMS pt cold to touch however oral temp 98.7. Caregiver reports face "more crooked than normal", unable to state LKW, unknown when symptoms started.  CBG 135 HR 118 Irregular 154/90 20 R forearm
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
## 2020-03-24 NOTE — ED Notes (Signed)
This RN unable to obtain IV access for blood cultures. Tobi Bastos, RN at bedside to attempt

## 2020-03-24 NOTE — Consult Note (Signed)
Pharmacy Antibiotic Note  Javier Robinson is a 74 y.o. male admitted on 03/24/2020 with sepsis of unclear origin at this point.  Pharmacy has been consulted for vancomycin and cefepime dosing. His renal function is at his apparent baseline. In the ED he received 2 grams IV cefepime and 1250 mg IV vancomycin  Plan: 1) begin vancomycin 750 mg IV Q 12 hrs  Ke: 0.063 hr-1, T1/2 11.1h  Css (calculated): 30.9/15.5 mcg/mL  SCr in am to assess renal function  2) begin cefepime 2 grams IV every 8 hours  Height: 5\' 8"  (172.7 cm) Weight: 63.5 kg (140 lb) IBW/kg (Calculated) : 68.4  Temp (24hrs), Avg:98 F (36.7 C), Min:98 F (36.7 C), Max:98 F (36.7 C)  Recent Labs  Lab 03/24/20 1022 03/24/20 1213  WBC 15.0*  --   CREATININE 0.70  --   LATICACIDVEN 2.3* 2.0*    Estimated Creatinine Clearance: 70.6 mL/min (by C-G formula based on SCr of 0.7 mg/dL).    No Known Allergies  Antimicrobials this admission: vancomycin 8/6 >>  metronidazole 8/6 >>  cefepime 8/6 >>  Microbiology results: 8/6 BCx: pending 8/6 UCx: pending  8/6 SARS CoV-2: pending   Thank you for allowing pharmacy to be a part of this patient's care.  10/6 03/24/2020 3:14 PM

## 2020-03-25 LAB — COMPREHENSIVE METABOLIC PANEL
ALT: 9 U/L (ref 0–44)
AST: 23 U/L (ref 15–41)
Albumin: 2.3 g/dL — ABNORMAL LOW (ref 3.5–5.0)
Alkaline Phosphatase: 49 U/L (ref 38–126)
Anion gap: 9 (ref 5–15)
BUN: 9 mg/dL (ref 8–23)
CO2: 23 mmol/L (ref 22–32)
Calcium: 7.8 mg/dL — ABNORMAL LOW (ref 8.9–10.3)
Chloride: 110 mmol/L (ref 98–111)
Creatinine, Ser: 0.58 mg/dL — ABNORMAL LOW (ref 0.61–1.24)
GFR calc Af Amer: >60 mL/min (ref >60)
GFR calc non Af Amer: >60 mL/min (ref >60)
Glucose, Bld: 75 mg/dL (ref 70–99)
Potassium: 2.8 mmol/L — ABNORMAL LOW (ref 3.5–5.1)
Sodium: 142 mmol/L (ref 135–145)
Total Bilirubin: 1.2 mg/dL (ref 0.3–1.2)
Total Protein: 6.2 g/dL — ABNORMAL LOW (ref 6.5–8.1)

## 2020-03-25 LAB — CBC
HCT: 24.5 % — ABNORMAL LOW (ref 39.0–52.0)
Hemoglobin: 7.9 g/dL — ABNORMAL LOW (ref 13.0–17.0)
MCH: 27.3 pg (ref 26.0–34.0)
MCHC: 32.2 g/dL (ref 30.0–36.0)
MCV: 84.8 fL (ref 80.0–100.0)
Platelets: 316 10*3/uL (ref 150–400)
RBC: 2.89 MIL/uL — ABNORMAL LOW (ref 4.22–5.81)
RDW: 17.8 % — ABNORMAL HIGH (ref 11.5–15.5)
WBC: 14.5 10*3/uL — ABNORMAL HIGH (ref 4.0–10.5)
nRBC: 0 % (ref 0.0–0.2)

## 2020-03-25 LAB — FOLATE: Folate: 19.3 ng/mL (ref >5.9)

## 2020-03-25 LAB — IRON AND TIBC
Iron: 19 ug/dL — ABNORMAL LOW (ref 45–182)
Saturation Ratios: 13 % — ABNORMAL LOW (ref 17.9–39.5)
TIBC: 153 ug/dL — ABNORMAL LOW (ref 250–450)
UIBC: 134 ug/dL

## 2020-03-25 LAB — LACTIC ACID, PLASMA: Lactic Acid, Venous: 1.2 mmol/L (ref 0.5–1.9)

## 2020-03-25 LAB — VITAMIN B12: Vitamin B-12: 223 pg/mL (ref 180–914)

## 2020-03-25 LAB — PROCALCITONIN: Procalcitonin: 0.16 ng/mL

## 2020-03-25 LAB — FERRITIN: Ferritin: 164 ng/mL (ref 24–336)

## 2020-03-25 MED ORDER — POTASSIUM CHLORIDE 10 MEQ/100ML IV SOLN
10.0000 meq | INTRAVENOUS | Status: AC
  Administered 2020-03-25 (×4): 10 meq via INTRAVENOUS
  Filled 2020-03-25 (×3): qty 100
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
## 2020-03-25 NOTE — Progress Notes (Signed)
PROGRESS NOTE    Javier Robinson  MLY:[id number] DOB: 1946-02-17 DOA: 03/24/2020 PCP: [facility]    Assessment & Plan:   Principal Problem:   Sepsis [facility]) Active Problems:   Essential hypertension   Pressure injury of skin   Acute metabolic encephalopathy   Javier Robinson is a 74 y.o. AA male with medical history significant for hypertension, GERD, hx of alcohol abuse, tobacco abuse, presented to the ER due to concerns for acute metabolic encephalopathy, large buttock wound, generalized weakness.   Sepsis likely 2/2 large buttock ulceration/wound On admission, tachycardic, tachypneic, leukocytosis, mildly elevated lactic acid Afebrile CT abdomen/pelvis showed inferior right buttock ulceration with surrounding inflammation extending into right thigh and perineum, no abscess, no evidence of necrotizing fasciitis or osteomyelitis UA unremarkable for infection BC x2 pending --started on vanc/cefepime/flagyl PLAN: WOC consulted --continue IV vanc/cefepime --d/c flagyl --continue MIVF  Lactic acidosis --resolved with IVF  Acute metabolic encephalopathy/generalized weakness Likely 2/2 above Reported ?Facial droop by caregiver, able to move all extremities spontaneously CT head unremarkable PLAN: --start Dys 2 diet after pt becomes more alert --PT/OT  Normocytic anemia Possible iron deficiency anemia Hemoglobin around baseline, 9 Anemia panel pending Daily CBC  Hypertension BP within acceptable range --continue to hold home amlodipine  Tobacco abuse Nicotine patch ordered  Possible alcohol abuse Monitor closely, may need CIWA protocol  Hypokalemia --replete PRN   DVT prophylaxis: Lovenox SQ Code Status: DNR  Family Communication:  Status is: inpatient Dispo:   The patient is from: home Anticipated d/c is to: likely SNF rehab Anticipated d/c date is: >3 days Patient currently is not medically stable to d/c due to: on empiric IV  abx for sepsis and large buttock ulcer, still somnolent, needs PT eval.   Subjective and Interval History:  During rounds, pt was somnolent, but could wake up enough to follow commands and answer questions.  Denied pain in his buttock area.    Later, pt became more alert and asked to eat.   Objective: Vitals:   03/24/20 2210 03/25/20 0223 03/25/20 0608 03/25/20 1613  BP: 121/65 128/61 120/62 125/65  Pulse: (!) 104 99 96 92  Resp: 20 18 17 16   Temp: 97.7 F (36.5 C) 98 F (36.7 C) 98.3 F (36.8 C) 98 F (36.7 C)  TempSrc: Oral Oral Oral Oral  SpO2: 99% 100% 100%   Weight:      Height:        Intake/Output Summary (Last 24 hours) at 03/25/2020 1854 Last data filed at 03/25/2020 1620 Gross per 24 hour  Intake 2023.33 ml  Output 400 ml  Net 1623.33 ml   Filed Weights   03/24/20 1008  Weight: 63.5 kg    Examination:   Constitutional: NAD, somnolent, but arousable HEENT: conjunctivae and lids normal CV: RRR no M,R,G. Distal pulses +2.  No cyanosis.   RESP: CTA B/L, normal respiratory effort  GI: +BS, NTND Extremities: No effusions, edema, or tenderness in BLE SKIN: warm, dry    Data Reviewed: I have personally reviewed following labs and imaging studies  CBC: Recent Labs  Lab 03/24/20 1022 03/25/20 0500  WBC 15.0* 14.5*  NEUTROABS 12.1*  --   HGB 9.7* 7.9*  HCT 29.3* 24.5*  MCV 84.0 84.8  PLT 391 316   Basic Metabolic Panel: Recent Labs  Lab 03/24/20 1022 03/25/20 0500  NA 142 142  K 3.3* 2.8*  CL 105 110  CO2 24 23  GLUCOSE 112* 75  BUN 10  9  CREATININE 0.70 0.58*  CALCIUM 8.4* 7.8*   GFR: Estimated Creatinine Clearance: 70.6 mL/min (A) (by C-G formula based on SCr of 0.58 mg/dL (L)). Liver Function Tests: Recent Labs  Lab 03/24/20 1022 03/25/20 0500  AST 25 23  ALT 10 9  ALKPHOS 64 49  BILITOT 1.4* 1.2  PROT 8.4* 6.2*  ALBUMIN 3.0* 2.3*   No results for input(s): LIPASE, AMYLASE in the last 168 hours. No results for input(s): AMMONIA  in the last 168 hours. Coagulation Profile: Recent Labs  Lab 03/24/20 1308  INR 1.1   Cardiac Enzymes: No results for input(s): CKTOTAL, CKMB, CKMBINDEX, TROPONINI in the last 168 hours. BNP (last 3 results) No results for input(s): PROBNP in the last 8760 hours. HbA1C: No results for input(s): HGBA1C in the last 72 hours. CBG: No results for input(s): GLUCAP in the last 168 hours. Lipid Profile: No results for input(s): CHOL, HDL, LDLCALC, TRIG, CHOLHDL, LDLDIRECT in the last 72 hours. Thyroid Function Tests: No results for input(s): TSH, T4TOTAL, FREET4, T3FREE, THYROIDAB in the last 72 hours. Anemia Panel: Recent Labs    03/25/20 0455 03/25/20 0500  VITAMINB12 223  --   FOLATE  --  19.3  FERRITIN  --  164  TIBC  --  153*  IRON  --  19*   Sepsis Labs: Recent Labs  Lab 03/24/20 1022 03/24/20 1213 03/25/20 0455 03/25/20 0500  PROCALCITON  --   --   --  0.16  LATICACIDVEN 2.3* 2.0* 1.2  --     Recent Results (from the past 240 hour(s))  Blood Culture (routine x 2)     Status: None (Preliminary result)   Collection Time: 03/24/20  1:08 PM   Specimen: BLOOD  Result Value Ref Range Status   Specimen Description BLOOD  Final   Special Requests NONE  Final   Culture   Final    NO GROWTH < 24 HOURS Performed at [facility], [address], [place], [place] [address]    Report Status PENDING  Incomplete  SARS Coronavirus 2 by RT PCR (hospital order, performed in [facility] hospital lab) Nasopharyngeal Nasopharyngeal Swab     Status: None   Collection Time: 03/24/20  4:36 PM   Specimen: Nasopharyngeal Swab  Result Value Ref Range Status   SARS Coronavirus 2 NEGATIVE NEGATIVE Final    Comment: (NOTE) SARS-CoV-2 target nucleic acids are NOT DETECTED.  The SARS-CoV-2 RNA is generally detectable in upper and lower respiratory specimens during the acute phase of infection. The lowest concentration of SARS-CoV-2 viral copies this assay can detect is  250 copies / mL. A negative result does not preclude SARS-CoV-2 infection and should not be used as the sole basis for treatment or other patient management decisions.  A negative result may occur with improper specimen collection / handling, submission of specimen other than nasopharyngeal swab, presence of viral mutation(s) within the areas targeted by this assay, and inadequate number of viral copies (<250 copies / mL). A negative result must be combined with clinical observations, patient history, and epidemiological information.  Fact Sheet for Patients:   BoilerBrush.com.cy  Fact Sheet for Healthcare Providers: https://pope.com/  This test is not yet approved or  cleared by the [place] FDA and has been authorized for detection and/or diagnosis of SARS-CoV-2 by FDA under an Emergency Use Authorization (EUA).  This EUA will remain in effect (meaning this test can be used) for the duration of the COVID-19 declaration under Section 564(b)(1)  of the Act, 21 U.S.C. section 360bbb-3(b)(1), unless the authorization is terminated or revoked sooner.  Performed at [facility], [address] [place], [place] [address]   Blood Culture (routine x 2)     Status: None (Preliminary result)   Collection Time: 03/25/20 12:53 AM   Specimen: BLOOD  Result Value Ref Range Status   Specimen Description BLOOD BLOOD RIGHT HAND  Final   Special Requests   Final    BOTTLES DRAWN AEROBIC AND ANAEROBIC Blood Culture adequate volume   Culture   Final    NO GROWTH < 12 HOURS Performed at [facility], [address] [place], [place] [address]    Report Status PENDING  Incomplete      Radiology Studies: DG Chest 2 View  Result Date: 03/24/2020 CLINICAL DATA:  Altered mental status. EXAM: CHEST - 2 VIEW COMPARISON:  12/15/2019. FINDINGS: Mediastinum hilar structures normal. Stable cardiomegaly. No pulmonary venous  congestion. No focal infiltrate. No pleural effusion or pneumothorax. Degenerative changes scoliosis thoracic spine. Degenerative changes both shoulders. Stable sclerotic changes right proximal humerus most likely secondary to infarct or enchondroma. IMPRESSION: Stable cardiomegaly. No acute cardiopulmonary disease. Previously identified pulmonary venous congestion has cleared. Electronically Signed   By: Maisie Fus  Register   On: 03/24/2020 10:39   CT Head Wo Contrast  Result Date: 03/24/2020 CLINICAL DATA:  Weakness, altered mental status EXAM: CT HEAD WITHOUT CONTRAST TECHNIQUE: Contiguous axial images were obtained from the base of the skull through the vertex without intravenous contrast. COMPARISON:  12/06/2019 FINDINGS: Brain: No evidence of acute infarction, hemorrhage, hydrocephalus, extra-axial collection or mass lesion/mass effect. 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 Moderate low-density changes within the periventricular and subcortical white matter compatible with chronic microvascular ischemic change. Moderate diffuse cerebral volume loss. Vascular: Atherosclerotic calcifications involving the large vessels of the skull base. No unexpected hyperdense vessel. Skull: Normal. Negative for fracture or focal lesion. Sinuses/Orbits: No acute finding. Other: None. IMPRESSION: 1. No acute intracranial findings. 2. Chronic microvascular ischemic change and cerebral volume loss. Electronically Signed   By: Duanne Guess D.O.   On: 03/24/2020 13:55   CT ABDOMEN PELVIS W CONTRAST  Result Date: 03/24/2020 CLINICAL DATA:  Buttock wound. EXAM: CT ABDOMEN AND PELVIS WITH CONTRAST TECHNIQUE: Multidetector CT imaging of the abdomen and pelvis was performed using the standard protocol following bolus administration of intravenous contrast. CONTRAST:  70mL OMNIPAQUE IOHEXOL 300 MG/ML  SOLN COMPARISON:  Abdominal ultrasound 12/07/2019 FINDINGS: The study is mildly motion degraded. Lower chest:  Minimal atelectasis or scarring in the lung bases. No pleural effusion. Three-vessel coronary atherosclerosis. Hepatobiliary: No focal liver abnormality is seen. Unremarkable gallbladder. No biliary dilatation. Pancreas: Unremarkable. Spleen: Unremarkable. Adrenals/Urinary Tract: Unremarkable adrenal glands. No evidence of renal mass or hydronephrosis. Punctate nonobstructing calculus or vascular calcification in the right renal hilum. Unremarkable bladder. Stomach/Bowel: The stomach is unremarkable. There is a moderate amount of stool in the rectum with a small amount of scattered stool in the colon. There is no evidence of bowel obstruction. Evaluation for bowel inflammation is limited by motion, paucity of abdominal fat, and absence of oral contrast. Unremarkable appendix. Vascular/Lymphatic: Abdominal aortic atherosclerosis without aneurysm. Heavily calcified plaque at the celiac, superior mesenteric, and bilateral renal artery origins with potentially flow limiting stenoses. No enlarged lymph nodes. Reproductive: Mildly enlarged prostate. Other: No ascites or pneumoperitoneum. Right buttock soft tissue ulceration inferior to the ischium with surrounding soft tissue thickening and stranding extending into the included portion of the  right thigh and perineum. No fluid collection, dissecting subcutaneous emphysema, or regional osseous destruction identified. 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 IMPRESSION: 1. Inferior right buttock ulceration with surrounding inflammation extending into the included portion of the right thigh and perineum. No abscess. No evidence of necrotizing fasciitis or osteomyelitis. 2. Moderate rectal stool burden. 3. Aortic Atherosclerosis (ICD10-I70.0). Electronically Signed   By: Sebastian AcheAllen  Grady M.D.   On: 03/24/2020 14:00     Scheduled Meds: . enoxaparin (LOVENOX) injection  40 mg Subcutaneous Q24H  . nicotine  14 mg Transdermal Daily  . sodium chloride  flush  3 mL Intravenous Once  . sodium chloride flush  3 mL Intravenous Q12H   Continuous Infusions: . sodium chloride Stopped (03/25/20 0654)  . ceFEPime (MAXIPIME) IV 200 mL/hr at 03/25/20 0700  . vancomycin 750 mg (03/25/20 1642)     LOS: 1 day     Darlin Priestlyina Macrina Lehnert, MD [facility] If 7PM-7AM, please contact night-coverage 03/25/2020, 6:54 PM

## 2020-03-25 NOTE — Progress Notes (Addendum)
Physical Therapy Evaluation Patient Details Name: Javier Robinson MRN: [id number] DOB: 18-Feb-1946 Today's Date: 03/25/2020   History of Present Illness  Per MD note:Keidan E Dyas is a 74 y.o. male with medical history significant for hypertension, GERD, hx of alcohol abuse, tobacco abuse, presented to the ER due to concerns for acute metabolic encephalopathy, large buttock wound, generalized weakness. Currently due to encephalopathy, patient unable to provide any history.  I was able to get in touch with patient's niece [name], who was not able to give me much information as patient lives by himself, with the roommates and has a caregiver who goes to his house daily to give him a bath.  Of note, patient's niece was aware of his buttock wound, no medical care was given.  Caregiver noted patient to be more confused and thought he had facial droop therefore called EMS.  Patient has been spending most of his time in bed for the past month as per niece, was previously able to ambulate with a walker with assistance.  Further history obtained from chart review and EDP.   Clinical Impression  Patient agrees to PT evaluation; difficulty with following commands and inconsistently able to answer questions verbally. Patient needs max assist for all bed mobility including supine <> sit and has increased pain during mobility. He has poor strength RLE and was not able to move his LLE during MMT. He is inconsistent with following verbal commands during PT evaluation and unable to tolerate sitting EOB due to pain and weakness. He will need a SNF placement due to poor mobility and decreased strength.     Follow Up Recommendations SNF    Equipment Recommendations  Rolling walker with 5" wheels    Recommendations for Other Services       Precautions / Restrictions Precautions Precautions: Fall Precaution Comments: High Fall Restrictions Weight Bearing Restrictions: No      Mobility  Bed Mobility Overal bed  mobility: Needs Assistance Bed Mobility: Rolling Rolling: Max assist         General bed mobility comments: MAX A for boost up in bed and to reposition trunk.  Transfers Overall transfer level:  (unable due to poor abiity to sit or roll.)               General transfer comment: Deferred. Pt unsafe/unable.  Ambulation/Gait                Stairs            Wheelchair Mobility    Modified Rankin (Stroke Patients Only)       Balance Overall balance assessment: Needs assistance Sitting-balance support: Feet unsupported Sitting balance-Leahy Scale: Poor Sitting balance - Comments: Pt unable to maintain long sitting upright in bed w/o assist.                                     Pertinent Vitals/Pain Pain Assessment: Faces Faces Pain Scale: Hurts even more Pain Location: L hip Pain Descriptors / Indicators: Aching Pain Intervention(s): Limited activity within patient's tolerance    Home Living Family/patient expects to be discharged to:: Private residence Living Arrangements: Non-relatives/Friends (friend Alinda Money lives there, helps him generally.) Available Help at Discharge: Friend(s);Available 24 hours/day Type of Home: House Home Access: Stairs to enter Entrance Stairs-Rails: Left Entrance Stairs-Number of Steps: 3 Home Layout: One level Home Equipment: Cane - single point;Walker - 2 wheels;Shower seat;Wheelchair - manual  Prior Function Level of Independence: Needs assistance   Gait / Transfers Assistance Needed: Per chart/caregiver, patient requires assistance for functional transfers; uses WC, does not amb w/o assist.  ADL's / Homemaking Assistance Needed: Per chart, pt req. assistance for all ADLs: dressing, toileting, sponge bathing, medication management, community mobility, and household management.        Hand Dominance   Dominant Hand: Right    Extremity/Trunk Assessment   Upper Extremity Assessment Upper  Extremity Assessment: Generalized weakness    Lower Extremity Assessment Lower Extremity Assessment: Generalized weakness       Communication   Communication: Expressive difficulties  Cognition Arousal/Alertness: Lethargic Behavior During Therapy: WFL for tasks assessed/performed Overall Cognitive Status: No family/caregiver present to determine baseline cognitive functioning                                 General Comments: Pt oriented to self, and limited place only. He has difficulty following VCs consistently and remains very lethargic t/o session. Requires cues to open eyes.      General Comments      Exercises Other Exercises Other Exercises: Pt educated on role of OT in acute setting, falls prevention. Other Exercises: OT facilitates bed mobility attempts, bed-level grooming.   Assessment/Plan 2 x / week    PT Assessment    PT Problem List  decreased strength, decreased mobility, decreased ambulation, decreased balance        PT Treatment Interventions   therapeutic exercise, neuromuscular training, gait training    PT Goals (Current goals can be found in the Care Plan section)  Acute Rehab PT Goals Patient Stated Goal: no goals stated PT Goal Formulation: Patient unable to participate in goal setting Time For Goal Achievement: 04/08/20 Potential to Achieve Goals: Poor    Frequency   2 x / week   Barriers to discharge        Co-evaluation               AM-PAC PT "6 Clicks" Mobility  Outcome Measure Help needed turning from your back to your side while in a flat bed without using bedrails?: Total Help needed moving from lying on your back to sitting on the side of a flat bed without using bedrails?: Total Help needed moving to and from a bed to a chair (including a wheelchair)?: Total Help needed standing up from a chair using your arms (e.g., wheelchair or bedside chair)?: Total Help needed to walk in hospital room?: Total Help needed  climbing 3-5 steps with a railing? : Total 6 Click Score: 6    End of Session   Activity Tolerance: Patient limited by fatigue;Patient limited by lethargy;Patient limited by pain   Nurse Communication: Mobility status PT Visit Diagnosis: Other abnormalities of gait and mobility (R26.89);Muscle weakness (generalized) (M62.81);Difficulty in walking, not elsewhere classified (R26.2)    Time: 0955-1010 PT Time Calculation (min) (ACUTE ONLY): 15 min   Charges:   PT Evaluation $PT Eval Low Complexity: 1 Low PT Treatments $Therapeutic Activity: 8-22 mins          Ezekiel Ina, PT DPT 03/25/2020, 11:16 AM

## 2020-03-25 NOTE — Progress Notes (Signed)
Pt laying in bed, waiting on dinner. Pt 4/10 pain in leg, denied wanting tylenol at this moment, wants to wait until after dinner. Call bell within reach of pt and RN number on board.

## 2020-03-26 DIAGNOSIS — L89319 Pressure ulcer of right buttock, unspecified stage: Secondary | ICD-10-CM

## 2020-03-26 LAB — BASIC METABOLIC PANEL
Anion gap: 8 (ref 5–15)
BUN: 10 mg/dL (ref 8–23)
CO2: 23 mmol/L (ref 22–32)
Calcium: 7.8 mg/dL — ABNORMAL LOW (ref 8.9–10.3)
Chloride: 108 mmol/L (ref 98–111)
Creatinine, Ser: 0.73 mg/dL (ref 0.61–1.24)
GFR calc Af Amer: >60 mL/min (ref >60)
GFR calc non Af Amer: >60 mL/min (ref >60)
Glucose, Bld: 86 mg/dL (ref 70–99)
Potassium: 2.8 mmol/L — ABNORMAL LOW (ref 3.5–5.1)
Sodium: 139 mmol/L (ref 135–145)

## 2020-03-26 LAB — CBC
HCT: 26.4 % — ABNORMAL LOW (ref 39.0–52.0)
Hemoglobin: 8.3 g/dL — ABNORMAL LOW (ref 13.0–17.0)
MCH: 26.9 pg (ref 26.0–34.0)
MCHC: 31.4 g/dL (ref 30.0–36.0)
MCV: 85.7 fL (ref 80.0–100.0)
Platelets: 364 10*3/uL (ref 150–400)
RBC: 3.08 MIL/uL — ABNORMAL LOW (ref 4.22–5.81)
RDW: 17.6 % — ABNORMAL HIGH (ref 11.5–15.5)
WBC: 10.5 10*3/uL (ref 4.0–10.5)
nRBC: 0 % (ref 0.0–0.2)

## 2020-03-26 LAB — URINE CULTURE

## 2020-03-26 LAB — PROCALCITONIN: Procalcitonin: 0.14 ng/mL

## 2020-03-26 LAB — PHOSPHORUS: Phosphorus: 2.5 mg/dL (ref 2.5–4.6)

## 2020-03-26 LAB — GLUCOSE, CAPILLARY: Glucose-Capillary: 94 mg/dL (ref 70–99)

## 2020-03-26 LAB — MAGNESIUM: Magnesium: 1.9 mg/dL (ref 1.7–2.4)

## 2020-03-26 MED ORDER — POTASSIUM CHLORIDE 20 MEQ PO PACK
40.0000 meq | PACK | ORAL | Status: AC
  Administered 2020-03-26 (×2): 40 meq via ORAL
  Filled 2020-03-26 (×2): qty 2

## 2020-03-26 MED ORDER — FERROUS SULFATE 325 (65 FE) MG PO TABS
325.0000 mg | ORAL_TABLET | Freq: Three times a day (TID) | ORAL | Status: DC
  Administered 2020-03-27 – 2020-03-29 (×6): 325 mg via ORAL
  Filled 2020-03-26 (×6): qty 1

## 2020-03-26 NOTE — Progress Notes (Signed)
PROGRESS NOTE    DARRYEL Robinson  ZOX:[id number] DOB: 03-02-1946 DOA: 03/24/2020 [facility]CP: [facility]    Assessment & Plan:   Principal Problem:   Sepsis [facility]) Active Problems:   Essential hypertension   Pressure injury of skin   Acute metabolic encephalopathy   Javier Robinson is a 74 y.o. AA male with medical history significant for hypertension, GERD, hx of alcohol abuse, tobacco abuse, presented to the ER due to concerns for acute metabolic encephalopathy, large buttock wound, generalized weakness.   Sepsis likely 2/2 large buttock ulceration/wound On admission, tachycardic, tachypneic, leukocytosis, mildly elevated lactic acid Afebrile CT abdomen/pelvis showed inferior right buttock ulceration with surrounding inflammation extending into right thigh and perineum, no abscess, no evidence of necrotizing fasciitis or osteomyelitis UA unremarkable for infection BC x2 pending --started on vanc/cefepime/flagyl, flagyl d/c'ed.  Leukocytosis resolved. PLAN: --WOC consult --continue IV vanc/cefepime --d/c MIVF since pt is eating and drinking now  Lactic acidosis --resolved with IVF  Acute metabolic encephalopathy/generalized weakness Likely 2/2 above Reported ?Facial droop by caregiver, able to move all extremities spontaneously CT head unremarkable --pt tolerating diet PLAN: --PT/OT rec SNF rehab  Normocytic anemia, iron def Hemoglobin around baseline, 9 Anemia panel pos for iron def only PLAN: --start oral iron supplement today  Hypertension BP within acceptable range --continue to hold home amlodipine  Tobacco abuse Nicotine patch ordered  Possible alcohol abuse Monitor closely, may need CIWA protocol  Hypokalemia --mag and phos levels wnl --recheck and replete with oral potassium   DVT prophylaxis: Lovenox SQ Code Status: DNR  Family Communication:  Status is: inpatient Dispo:   The patient is from: home Anticipated d/c is to:  SNF rehab Anticipated d/c date is: 2-3 days Patient currently is not medically stable to d/c due to: on empiric IV abx for sepsis and large buttock ulcer, still somnolent   Subjective and Interval History:  Stayed mostly sleepy, but did wake up to eat all his breakfast.  Denied pain.   Objective: Vitals:   03/25/20 1938 03/26/20 0001 03/26/20 0346 03/26/20 0744  BP: 130/60 121/74 (!) 117/59 120/60  Pulse: (!) 102 90 93 81  Resp: 18 16 16 17   Temp: 97.8 F (36.6 C) 97.6 F (36.4 C) 97.7 F (36.5 C) 97.6 F (36.4 C)  TempSrc: Oral Oral Oral   SpO2: 100% 100% 100% 94%  Weight:      Height:        Intake/Output Summary (Last 24 hours) at 03/26/2020 1444 Last data filed at 03/26/2020 1309 Gross per 24 hour  Intake 609.28 ml  Output 1550 ml  Net -940.72 ml   Filed Weights   03/24/20 1008  Weight: 63.5 kg    Examination:   Constitutional: NAD, sleepy but arousable HEENT: conjunctivae and lids normal, EOMI CV: RRR no M,R,G. Distal pulses +2.  No cyanosis.   RESP: CTA B/L, normal respiratory effort  GI: +BS, NTND Extremities: No effusions, edema, or tenderness in BLE   Data Reviewed: I have personally reviewed following labs and imaging studies  CBC: Recent Labs  Lab 03/24/20 1022 03/25/20 0500 03/26/20 0437  WBC 15.0* 14.5* 10.5  NEUTROABS 12.1*  --   --   HGB 9.7* 7.9* 8.3*  HCT 29.3* 24.5* 26.4*  MCV 84.0 84.8 85.7  PLT 391 316 364   Basic Metabolic Panel: Recent Labs  Lab 03/24/20 1022 03/25/20 0500 03/26/20 0437  NA 142 142 139  K 3.3* 2.8* 2.8*  CL 105 110 108  CO2 24 23 23   GLUCOSE 112* 75 86  BUN 10 9 10   CREATININE 0.70 0.58* 0.73  CALCIUM 8.4* 7.8* 7.8*  MG  --   --  1.9  PHOS  --   --  2.5   GFR: Estimated Creatinine Clearance: 70.6 mL/min (by C-G formula based on SCr of 0.73 mg/dL). Liver Function Tests: Recent Labs  Lab 03/24/20 1022 03/25/20 0500  AST 25 23  ALT 10 9  ALKPHOS 64 49  BILITOT 1.4* 1.2  PROT 8.4* 6.2*  ALBUMIN  3.0* 2.3*   No results for input(s): LIPASE, AMYLASE in the last 168 hours. No results for input(s): AMMONIA in the last 168 hours. Coagulation Profile: Recent Labs  Lab 03/24/20 1308  INR 1.1   Cardiac Enzymes: No results for input(s): CKTOTAL, CKMB, CKMBINDEX, TROPONINI in the last 168 hours. BNP (last 3 results) No results for input(s): PROBNP in the last 8760 hours. HbA1C: No results for input(s): HGBA1C in the last 72 hours. CBG: Recent Labs  Lab 03/26/20 1147  GLUCAP 94   Lipid Profile: No results for input(s): CHOL, HDL, LDLCALC, TRIG, CHOLHDL, LDLDIRECT in the last 72 hours. Thyroid Function Tests: No results for input(s): TSH, T4TOTAL, FREET4, T3FREE, THYROIDAB in the last 72 hours. Anemia Panel: Recent Labs    03/25/20 0455 03/25/20 0500  VITAMINB12 223  --   FOLATE  --  19.3  FERRITIN  --  164  TIBC  --  153*  IRON  --  19*   Sepsis Labs: Recent Labs  Lab 03/24/20 1022 03/24/20 1213 03/25/20 0455 03/25/20 0500 03/26/20 0437  PROCALCITON  --   --   --  0.16 0.14  LATICACIDVEN 2.3* 2.0* 1.2  --   --     Recent Results (from the past 240 hour(s))  Blood Culture (routine x 2)     Status: None (Preliminary result)   Collection Time: 03/24/20  1:08 PM   Specimen: BLOOD  Result Value Ref Range Status   Specimen Description BLOOD  Final   Special Requests NONE  Final   Culture   Final    NO GROWTH 2 DAYS Performed at [facility], [address], [place], [address] [place]    Report Status PENDING  Incomplete  Urine culture     Status: Abnormal   Collection Time: 03/24/20  1:08 PM   Specimen: In/Out Cath Urine  Result Value Ref Range Status   Specimen Description   Final    IN/OUT CATH URINE Performed at [facility], [address] [place], [address] [place]    Special Requests   Final    NONE Performed at [facility], [address] [place], [address] [place]    Culture MULTIPLE SPECIES PRESENT, SUGGEST  RECOLLECTION (A)  Final   Report Status 03/26/2020 FINAL  Final  SARS Coronavirus 2 by RT PCR (hospital order, performed in [facility] hospital lab) Nasopharyngeal Nasopharyngeal Swab     Status: None   Collection Time: 03/24/20  4:36 PM   Specimen: Nasopharyngeal Swab  Result Value Ref Range Status   SARS Coronavirus 2 NEGATIVE NEGATIVE Final    Comment: (NOTE) SARS-CoV-2 target nucleic acids are NOT DETECTED.  The SARS-CoV-2 RNA is generally detectable in upper and lower respiratory specimens during the acute phase of infection. The lowest concentration of SARS-CoV-2 viral copies this assay can detect is 250 copies / mL. A negative result does not preclude SARS-CoV-2 infection and should not be used as  the sole basis for treatment or other patient management decisions.  A negative result may occur with improper specimen collection / handling, submission of specimen other than nasopharyngeal swab, presence of viral mutation(s) within the areas targeted by this assay, and inadequate number of viral copies (<250 copies / mL). A negative result must be combined with clinical observations, patient history, and epidemiological information.  Fact Sheet for Patients:   BoilerBrush.com.cy  Fact Sheet for Healthcare Providers: https://pope.com/  This test is not yet approved or  cleared by the [place] FDA and has been authorized for detection and/or diagnosis of SARS-CoV-2 by FDA under an Emergency Use Authorization (EUA).  This EUA will remain in effect (meaning this test can be used) for the duration of the COVID-19 declaration under Section 564(b)(1) of the Act, 21 U.S.C. section 360bbb-3(b)(1), unless the authorization is terminated or revoked sooner.  Performed at [facility], [address] [place], [place] [address]   Blood Culture (routine x 2)     Status: None (Preliminary result)   Collection Time: 03/25/20  12:53 AM   Specimen: BLOOD  Result Value Ref Range Status   Specimen Description BLOOD BLOOD RIGHT HAND  Final   Special Requests   Final    BOTTLES DRAWN AEROBIC AND ANAEROBIC Blood Culture adequate volume   Culture   Final    NO GROWTH 1 DAY Performed at [facility], [address], [place], [place] [address]    Report Status PENDING  Incomplete      Radiology Studies: No results found.   Scheduled Meds: . enoxaparin (LOVENOX) injection  40 mg Subcutaneous Q24H  . nicotine  14 mg Transdermal Daily  . sodium chloride flush  3 mL Intravenous Once  . sodium chloride flush  3 mL Intravenous Q12H   Continuous Infusions: . sodium chloride 10 mL/hr at 03/26/20 0555  . ceFEPime (MAXIPIME) IV 2 g (03/26/20 1305)  . vancomycin Stopped (03/26/20 0542)     LOS: 2 days     Darlin Priestly, MD [facility] If 7PM-7AM, please contact night-coverage 03/26/2020, 2:44 PM

## 2020-03-27 LAB — CBC
HCT: 22.5 % — ABNORMAL LOW (ref 39.0–52.0)
Hemoglobin: 7.6 g/dL — ABNORMAL LOW (ref 13.0–17.0)
MCH: 27.8 pg (ref 26.0–34.0)
MCHC: 33.8 g/dL (ref 30.0–36.0)
MCV: 82.4 fL (ref 80.0–100.0)
Platelets: 335 10*3/uL (ref 150–400)
RBC: 2.73 MIL/uL — ABNORMAL LOW (ref 4.22–5.81)
RDW: 17.7 % — ABNORMAL HIGH (ref 11.5–15.5)
WBC: 9.1 10*3/uL (ref 4.0–10.5)
nRBC: 0 % (ref 0.0–0.2)

## 2020-03-27 LAB — BASIC METABOLIC PANEL
Anion gap: 5 (ref 5–15)
BUN: 9 mg/dL (ref 8–23)
CO2: 23 mmol/L (ref 22–32)
Calcium: 7.8 mg/dL — ABNORMAL LOW (ref 8.9–10.3)
Chloride: 110 mmol/L (ref 98–111)
Creatinine, Ser: 0.6 mg/dL — ABNORMAL LOW (ref 0.61–1.24)
GFR calc Af Amer: >60 mL/min (ref >60)
GFR calc non Af Amer: >60 mL/min (ref >60)
Glucose, Bld: 88 mg/dL (ref 70–99)
Potassium: 3.6 mmol/L (ref 3.5–5.1)
Sodium: 138 mmol/L (ref 135–145)

## 2020-03-27 LAB — BLOOD GAS, ARTERIAL
Acid-Base Excess: 0.5 mmol/L (ref 0.0–2.0)
Bicarbonate: 23.8 mmol/L (ref 20.0–28.0)
FIO2: 0.21
O2 Saturation: 97.1 %
Patient temperature: 37
pCO2 arterial: 32 mmHg (ref 32.0–48.0)
pH, Arterial: 7.48 — ABNORMAL HIGH (ref 7.350–7.450)
pO2, Arterial: 85 mmHg (ref 83.0–108.0)

## 2020-03-27 LAB — AMMONIA: Ammonia: 23 umol/L (ref 9–35)

## 2020-03-27 LAB — PROCALCITONIN: Procalcitonin: <0.1 ng/mL

## 2020-03-27 LAB — MAGNESIUM: Magnesium: 1.8 mg/dL (ref 1.7–2.4)

## 2020-03-27 MED ORDER — AMOXICILLIN-POT CLAVULANATE 875-125 MG PO TABS
1.0000 | ORAL_TABLET | Freq: Two times a day (BID) | ORAL | Status: DC
  Administered 2020-03-27 – 2020-03-29 (×5): 1 via ORAL
  Filled 2020-03-27 (×5): qty 1

## 2020-03-27 MED ORDER — AMLODIPINE BESYLATE 5 MG PO TABS
5.0000 mg | ORAL_TABLET | Freq: Every day | ORAL | Status: DC
  Administered 2020-03-28 – 2020-03-29 (×2): 5 mg via ORAL
  Filled 2020-03-27 (×2): qty 1

## 2020-03-27 MED ORDER — SULFAMETHOXAZOLE-TRIMETHOPRIM 400-80 MG PO TABS
1.0000 | ORAL_TABLET | Freq: Two times a day (BID) | ORAL | Status: DC
  Administered 2020-03-27 – 2020-03-29 (×5): 1 via ORAL
  Filled 2020-03-27 (×6): qty 1

## 2020-03-27 NOTE — Consult Note (Signed)
WOC Nurse Consult Note: Reason for Consult:Stage 3 sacral wound, present on admission.  Wound type:Stage 3 pressure Pressure Injury POA: Yes Measurement:2.5 cm x 2 cm x 0.3 cm  Wound SLP:NPYY pink nongranulating Drainage (amount, consistency, odor) minimal serosanguinous  Musty odor  Dry skin Periwound:dry skin Dressing procedure/placement/frequency: Cleanse sacral wound with NS and pat dry. Apply small piece alginate to wound bed. Cover with sacral foam.  Change every three days and PRN soilage.  Will not follow at this time.  Please re-consult if needed.  Maple Hudson MSN, RN, FNP-BC CWON Wound, Ostomy, Continence Nurse Pager [phone]

## 2020-03-27 NOTE — Consult Note (Signed)
I have placed a request via Secure Chat to Dr. Lai requesting photos of the wound areas of concern to be placed in the EMR.    Marketia Stallsmith MSN,RN,CWOCN, CNS, CWON-AP [phone] 

## 2020-03-27 NOTE — Progress Notes (Signed)
Physical Therapy Treatment Patient Details Name: Javier Robinson MRN: [id number] DOB: [birth year] Today's Date: 03/27/2020    History of Present Illness Per MD note:Javier Robinson is a 74 y.o. male with medical history significant for hypertension, GERD, hx of alcohol abuse, tobacco abuse, presented to the ER due to concerns for acute metabolic encephalopathy, large buttock wound, generalized weakness. Currently due to encephalopathy, patient unable to provide any history.  I was able to get in touch with patient's niece Javier Robinson, who was not able to give me much information as patient lives by himself, with the roommates and has a caregiver who goes to his house daily to give him a bath.  Of note, patient's niece was aware of his buttock wound, no medical care was given.  Caregiver noted patient to be more confused and thought he had facial droop therefore called EMS.  Patient has been spending most of his time in bed for the past month as per niece, was previously able to ambulate with a walker with assistance.  Further history obtained from chart review and EDP.     PT Comments    Patient lethargic but cooperative during session. Patient required Max A for rolling in bed to left and right for repositioning and Max A for supine to and from short sitting. Verbal cues for task initiation and sequencing. Attempted sit to stand transfer however patient with limited active participation and weight acceptance with transfer efforts. Unable to stand at this time. Max A for incremental scooting x 2 bouts along edge of bed with cues for technique.  Patient participated with strengthening LE exercises in supine. SNF remains appropriate discharge plan at this time. Recommend to continue PT to address functional limitations to maximize independence and return to prior level of function.     Follow Up Recommendations  SNF     Equipment Recommendations  Rolling walker with 5" wheels    Recommendations for Other  Services       Precautions / Restrictions Precautions Precautions: Fall Restrictions Weight Bearing Restrictions: No    Mobility  Bed Mobility Overal bed mobility: Needs Assistance Bed Mobility: Rolling;Supine to Sit;Sit to Supine Rolling: Max assist   Supine to sit: Max assist Sit to supine: Max assist   General bed mobility comments: verbal cues for technique, task initiaion, and sequencing. increased time required to complete tasks.   Transfers Overall transfer level: Needs assistance   Transfers: Lateral/Scoot Transfers          Lateral/Scoot Transfers: Max assist General transfer comment: attempted sit to stand transfers, however patient with minimal active efforts with transfer efforts despite cues for technique. Max A for incremental scooting x 2 bouts towards left side.   Ambulation/Gait                 Stairs             Wheelchair Mobility    Modified Rankin (Stroke Patients Only)       Balance Overall balance assessment: Needs assistance Sitting-balance support: Feet unsupported Sitting balance-Leahy Scale: Poor Sitting balance - Comments: patient with intermittent right trunk lean. close stand by assistance for safety and occasional Min A for midline                                     Cognition Arousal/Alertness: Lethargic Behavior During Therapy: WFL for tasks assessed/performed Overall Cognitive Status: No family/caregiver present  to determine baseline cognitive functioning                                 General Comments: able to follow single step commands with increased time       Exercises General Exercises - Lower Extremity Heel Slides: AAROM;Strengthening;Both;10 reps;Supine Hip ABduction/ADduction: AAROM;Strengthening;Both;10 reps;Supine Straight Leg Raises: AAROM;Strengthening;Both;10 reps;Supine Other Exercises Other Exercises: verbal cues for technique     General Comments         Pertinent Vitals/Pain Pain Location: buttocks  Pain Intervention(s): Repositioned    Home Living                      Prior Function            PT Goals (current goals can now be found in the care plan section) Acute Rehab PT Goals Patient Stated Goal: to get stronger  PT Goal Formulation: Patient unable to participate in goal setting Time For Goal Achievement: 04/08/20 Potential to Achieve Goals: Fair Progress towards PT goals: Progressing toward goals    Frequency    Min 2X/week      PT Plan Current plan remains appropriate    Co-evaluation              AM-PAC PT "6 Clicks" Mobility   Outcome Measure  Help needed turning from your back to your side while in a flat bed without using bedrails?: A Lot Help needed moving from lying on your back to sitting on the side of a flat bed without using bedrails?: A Lot Help needed moving to and from a bed to a chair (including a wheelchair)?: Total Help needed standing up from a chair using your arms (e.g., wheelchair or bedside chair)?: Total Help needed to walk in hospital room?: Total Help needed climbing 3-5 steps with a railing? : Total 6 Click Score: 8    End of Session   Activity Tolerance: Patient limited by fatigue Patient left: in chair;with call bell/phone within reach;with chair alarm set Nurse Communication: Mobility status (via white board updated ) PT Visit Diagnosis: Other abnormalities of gait and mobility (R26.89);Muscle weakness (generalized) (M62.81);Difficulty in walking, not elsewhere classified (R26.2)     Time: 9211-9417 PT Time Calculation (min) (ACUTE ONLY): 23 min  Charges:  $Therapeutic Exercise: 8-22 mins $Therapeutic Activity: 8-22 mins                     Donna Bernard, PT, MPT   Ina Homes 03/27/2020, 2:05 PM

## 2020-03-27 NOTE — NC FL2 (Signed)
[place] MEDICAID FL2 LEVEL OF CARE SCREENING TOOL     IDENTIFICATION  Patient Name: Javier Robinson Birthdate: [birth year] Sex: male Admission Date (Current Location): 03/24/2020  [place] and [place] Number:  [address] [id number] [facility] Facility and Address:  [facility], [address] [place], [place] [address]      Provider Number: [id number]  Attending Physician Name and Address:  Darlin Priestly, MD  Relative Name and Phone Number:  Rexene Alberts [phone]    Current Level of Care: Hospital Recommended Level of Care: Skilled Nursing Facility Prior Approval Number:    Date Approved/Denied:   PASRR Number: [id number]  Discharge Plan: SNF    Current Diagnoses: Patient Active Problem List   Diagnosis Date Noted  . Sepsis (HCC) 03/24/2020  . Acute metabolic encephalopathy 12/15/2019  . At high risk for aspiration 12/15/2019  . Pressure injury of skin 11/15/2019  . Protein-calorie malnutrition, severe 11/13/2019  . Malnutrition of moderate degree 08/10/2019  . Alcohol dependence (HCC) 08/09/2019  . Essential hypertension 08/09/2019  . Metabolic acidosis, increased anion gap (IAG) 08/09/2019  . Prolonged QT interval 08/09/2019  . Hypertensive urgency 07/03/2019  . Acute CHF (congestive heart failure) (HCC) 06/30/2019  . Hypoglycemia 06/30/2019  . SIRS (systemic inflammatory response syndrome) (HCC) 06/30/2019  . Hyponatremia 06/30/2019  . Hypomagnesemia 06/30/2019    Orientation RESPIRATION BLADDER Height & Weight     Self, Place  Normal External catheter Weight: 140 lb (63.5 kg) Height:  5\' 8"  (172.7 cm)  BEHAVIORAL SYMPTOMS/MOOD NEUROLOGICAL BOWEL NUTRITION STATUS      Incontinent Diet (Dys 2 thin liquids)  AMBULATORY STATUS COMMUNICATION OF NEEDS Skin   Extensive Assist Verbally PU Stage and Appropriate Care     PU Stage 3 Dressing: Daily                 Personal Care Assistance Level of Assistance  Bathing, Dressing Bathing  Assistance: Limited assistance   Dressing Assistance: Limited assistance     Functional Limitations Info             SPECIAL CARE FACTORS FREQUENCY  PT (By licensed PT), OT (By licensed OT)     PT Frequency: 5x week OT Frequency: 5x week            Contractures Contractures Info: Not present    Additional Factors Info  Code Status, Allergies Code Status Info: DNR Allergies Info: NKDA           Current Medications (03/27/2020):  This is the current hospital active medication list Current Facility-Administered Medications  Medication Dose Route Frequency Provider Last Rate Last Admin  . 0.9 %  sodium chloride infusion   Intravenous PRN 05/27/2020, MD   Stopped at 03/26/20 1724  . acetaminophen (TYLENOL) tablet 650 mg  650 mg Oral Q6H PRN 05/26/20, MD       Or  . acetaminophen (TYLENOL) suppository 650 mg  650 mg Rectal Q6H PRN Briant Cedar, MD      . albuterol (PROVENTIL) (2.5 MG/3ML) 0.083% nebulizer solution 2.5 mg  2.5 mg Nebulization Q2H PRN Briant Cedar, MD      . amoxicillin-clavulanate (AUGMENTIN) 875-125 MG per tablet 1 tablet  1 tablet Oral Q12H Briant Cedar, MD      . enoxaparin (LOVENOX) injection 40 mg  40 mg Subcutaneous Q24H Darlin Priestly, MD   40 mg at 03/27/20 1132  . ferrous sulfate tablet 325 mg  325 mg Oral TID WC 05/27/20,  MD   325 mg at 03/27/20 1132  . nicotine (NICODERM CQ - dosed in mg/24 hours) patch 14 mg  14 mg Transdermal Daily Briant Cedar, MD   14 mg at 03/27/20 1142  . ondansetron (ZOFRAN) tablet 4 mg  4 mg Oral Q6H PRN Briant Cedar, MD       Or  . ondansetron [facility]) injection 4 mg  4 mg Intravenous Q6H PRN Briant Cedar, MD      . senna-docusate (Senokot-S) tablet 1 tablet  1 tablet Oral QHS PRN Briant Cedar, MD      . sodium chloride flush (NS) 0.9 % injection 3 mL  3 mL Intravenous Q12H Briant Cedar, MD   3 mL at 03/27/20 1135  .  sulfamethoxazole-trimethoprim (BACTRIM) 400-80 MG per tablet 1 tablet  1 tablet Oral Q12H Darlin Priestly, MD         Discharge Medications: Please see discharge summary for a list of discharge medications.  Relevant Imaging Results:  Relevant Lab Results:   Additional Information SSN: [id number]  Janetta Vandoren, Lemar Livings, LCSW

## 2020-03-27 NOTE — Progress Notes (Signed)
PROGRESS NOTE    DUGLAS HEIER  NWG:[id number] DOB: 07-23-1946 DOA: 03/24/2020 [facility]: [facility]    Assessment & Plan:   Principal Problem:   Sepsis [facility]) Active Problems:   Essential hypertension   Pressure injury of skin   Acute metabolic encephalopathy   Javier Robinson is a 74 y.o. AA male with medical history significant for hypertension, GERD, hx of alcohol abuse, tobacco abuse, presented to the ER due to concerns for acute metabolic encephalopathy, large buttock wound, generalized weakness.   Sepsis likely 2/2 large buttock ulceration/wound On admission, tachycardic, tachypneic, leukocytosis, mildly elevated lactic acid Afebrile CT abdomen/pelvis showed inferior right buttock ulceration with surrounding inflammation extending into right thigh and perineum, no abscess, no evidence of necrotizing fasciitis or osteomyelitis UA unremarkable for infection BC x2 pending --started on vanc/cefepime/flagyl, flagyl d/c'ed.  Leukocytosis resolved. PLAN: --WOC consult today, follow dressing rec --De-escalate abx to augmentin and Bactrim today  Lactic acidosis --resolved with IVF  Acute metabolic encephalopathy/generalized weakness Likely 2/2 above Reported ?Facial droop by caregiver, able to move all extremities spontaneously CT head unremarkable --pt tolerating diet PLAN: --ABG and ammonia levels today  Normocytic anemia, iron def Hemoglobin around baseline, 9 Anemia panel pos for iron def only PLAN: --continue oral iron  Hypertension --resume home amlodipine today  Tobacco abuse Nicotine patch ordered  Possible alcohol abuse Monitor closely, may need CIWA protocol  Hypokalemia --mag and phos levels wnl --replete PRN   DVT prophylaxis: Lovenox SQ Code Status: DNR  Family Communication:  Status is: inpatient Dispo:   The patient is from: home Anticipated d/c is to: SNF rehab Anticipated d/c date is: 2-3 days Patient currently  is not medically stable to d/c due to: on empiric abx for sepsis and large buttock ulcer, still somnolent   Subjective and Interval History:  Pt is still sleeping all the time, but will wake up to eat meals.     Objective: Vitals:   03/26/20 0744 03/26/20 1556 03/27/20 0815 03/27/20 1158  BP: 120/60 (!) 102/57 118/64 (!) 150/66  Pulse: 81 (!) 105 81 84  Resp: 17 18 16 18   Temp: 97.6 F (36.4 C) 98.9 F (37.2 C) 98.2 F (36.8 C) 98.4 F (36.9 C)  TempSrc:  Oral Oral Oral  SpO2: 94% 100% 100% 100%  Weight:      Height:        Intake/Output Summary (Last 24 hours) at 03/27/2020 1656 Last data filed at 03/27/2020 1600 Gross per 24 hour  Intake 1781.29 ml  Output 800 ml  Net 981.29 ml   Filed Weights   03/24/20 1008  Weight: 63.5 kg    Examination:   Constitutional: NAD, somnolent HEENT: conjunctivae and lids normal CV: RRR no M,R,G. Distal pulses +2.  No cyanosis.   RESP: CTA B/L, normal respiratory effort  GI: +BS, NTND Extremities: No effusions, edema in BLE SKIN: warm, dry    Data Reviewed: I have personally reviewed following labs and imaging studies  CBC: Recent Labs  Lab 03/24/20 1022 03/25/20 0500 03/26/20 0437 03/27/20 0432  WBC 15.0* 14.5* 10.5 9.1  NEUTROABS 12.1*  --   --   --   HGB 9.7* 7.9* 8.3* 7.6*  HCT 29.3* 24.5* 26.4* 22.5*  MCV 84.0 84.8 85.7 82.4  PLT 391 316 364 335   Basic Metabolic Panel: Recent Labs  Lab 03/24/20 1022 03/25/20 0500 03/26/20 0437 03/27/20 0432  NA 142 142 139 138  K 3.3* 2.8* 2.8* 3.6  CL  105 110 108 110  CO2 24 23 23 23   GLUCOSE 112* 75 86 88  BUN 10 9 10 9   CREATININE 0.70 0.58* 0.73 0.60*  CALCIUM 8.4* 7.8* 7.8* 7.8*  MG  --   --  1.9 1.8  PHOS  --   --  2.5  --    GFR: Estimated Creatinine Clearance: 70.6 mL/min (A) (by C-G formula based on SCr of 0.6 mg/dL (L)). Liver Function Tests: Recent Labs  Lab 03/24/20 1022 03/25/20 0500  AST 25 23  ALT 10 9  ALKPHOS 64 49  BILITOT 1.4* 1.2  PROT  8.4* 6.2*  ALBUMIN 3.0* 2.3*   No results for input(s): LIPASE, AMYLASE in the last 168 hours. No results for input(s): AMMONIA in the last 168 hours. Coagulation Profile: Recent Labs  Lab 03/24/20 1308  INR 1.1   Cardiac Enzymes: No results for input(s): CKTOTAL, CKMB, CKMBINDEX, TROPONINI in the last 168 hours. BNP (last 3 results) No results for input(s): PROBNP in the last 8760 hours. HbA1C: No results for input(s): HGBA1C in the last 72 hours. CBG: Recent Labs  Lab 03/26/20 1147  GLUCAP 94   Lipid Profile: No results for input(s): CHOL, HDL, LDLCALC, TRIG, CHOLHDL, LDLDIRECT in the last 72 hours. Thyroid Function Tests: No results for input(s): TSH, T4TOTAL, FREET4, T3FREE, THYROIDAB in the last 72 hours. Anemia Panel: Recent Labs    03/25/20 0455 03/25/20 0500  VITAMINB12 223  --   FOLATE  --  19.3  FERRITIN  --  164  TIBC  --  153*  IRON  --  19*   Sepsis Labs: Recent Labs  Lab 03/24/20 1022 03/24/20 1213 03/25/20 0455 03/25/20 0500 03/26/20 0437 03/27/20 0432  PROCALCITON  --   --   --  0.16 0.14 <0.10  LATICACIDVEN 2.3* 2.0* 1.2  --   --   --     Recent Results (from the past 240 hour(s))  Blood Culture (routine x 2)     Status: None (Preliminary result)   Collection Time: 03/24/20  1:08 PM   Specimen: BLOOD  Result Value Ref Range Status   Specimen Description BLOOD  Final   Special Requests NONE  Final   Culture   Final    NO GROWTH 3 DAYS Performed at [facility], [address] [place], [address] [place]    Report Status PENDING  Incomplete  Urine culture     Status: Abnormal   Collection Time: 03/24/20  1:08 PM   Specimen: In/Out Cath Urine  Result Value Ref Range Status   Specimen Description   Final    IN/OUT CATH URINE Performed at [facility], [address] [place], [address] [place]    Special Requests   Final    NONE Performed at [facility], [address] [place], [address] [place]     Culture MULTIPLE SPECIES PRESENT, SUGGEST RECOLLECTION (A)  Final   Report Status 03/26/2020 FINAL  Final  SARS Coronavirus 2 by RT PCR (hospital order, performed in [facility] hospital lab) Nasopharyngeal Nasopharyngeal Swab     Status: None   Collection Time: 03/24/20  4:36 PM   Specimen: Nasopharyngeal Swab  Result Value Ref Range Status   SARS Coronavirus 2 NEGATIVE NEGATIVE Final    Comment: (NOTE) SARS-CoV-2 target nucleic acids are NOT DETECTED.  The SARS-CoV-2 RNA is generally detectable in upper and lower respiratory specimens during the acute phase of infection. The lowest concentration of SARS-CoV-2 viral copies this  assay can detect is 250 copies / mL. A negative result does not preclude SARS-CoV-2 infection and should not be used as the sole basis for treatment or other patient management decisions.  A negative result may occur with improper specimen collection / handling, submission of specimen other than nasopharyngeal swab, presence of viral mutation(s) within the areas targeted by this assay, and inadequate number of viral copies (<250 copies / mL). A negative result must be combined with clinical observations, patient history, and epidemiological information.  Fact Sheet for Patients:   BoilerBrush.com.cy  Fact Sheet for Healthcare Providers: https://pope.com/  This test is not yet approved or  cleared by the [place] FDA and has been authorized for detection and/or diagnosis of SARS-CoV-2 by FDA under an Emergency Use Authorization (EUA).  This EUA will remain in effect (meaning this test can be used) for the duration of the COVID-19 declaration under Section 564(b)(1) of the Act, 21 U.S.C. section 360bbb-3(b)(1), unless the authorization is terminated or revoked sooner.  Performed at [facility], [address] [place], [place] [address]   Blood Culture (routine x 2)     Status: None  (Preliminary result)   Collection Time: 03/25/20 12:53 AM   Specimen: BLOOD  Result Value Ref Range Status   Specimen Description BLOOD BLOOD RIGHT HAND  Final   Special Requests   Final    BOTTLES DRAWN AEROBIC AND ANAEROBIC Blood Culture adequate volume   Culture   Final    NO GROWTH 2 DAYS Performed at [facility], [address], [place], [place] [address]    Report Status PENDING  Incomplete      Radiology Studies: No results found.   Scheduled Meds:  amoxicillin-clavulanate  1 tablet Oral Q12H   enoxaparin (LOVENOX) injection  40 mg Subcutaneous Q24H   ferrous sulfate  325 mg Oral TID WC   nicotine  14 mg Transdermal Daily   sodium chloride flush  3 mL Intravenous Q12H   sulfamethoxazole-trimethoprim  1 tablet Oral Q12H   Continuous Infusions:  sodium chloride Stopped (03/26/20 1724)     LOS: 3 days     Darlin Priestly, MD [facility] If 7PM-7AM, please contact night-coverage 03/27/2020, 4:56 PM

## 2020-03-27 NOTE — TOC Initial Note (Signed)
Transition of Care [facility]) - Initial/Assessment Note    Patient Details  Name: Javier Robinson MRN: [id number] Date of Birth: 1946/05/14  Transition [facility] Care [facility]) CM/SW Contact:    Lucy Chris, LCSW Phone Number: 03/27/2020, 1:12 PM  Clinical Narrative:  Spoke with [name] via telephone to discuss discharge plan. She is aware recommendation is to go to a SNF and feels this will become long term due to pt can not take care of himself anymore. He has been to [facility] and [facility] and she would like for worker to look into [facility]. Will do Fl2 and bed search. PT & OT to see today and then will try to get in with rehab and then transition to long term care. He does have medicaid coverage. He defers to niece and sister. He has an aide who helps with his bathing and his roommate does cooking and cleaning. He wears depends but has wounds that need nursing care to be able to heal. Niece thinks he has had his vaccines. Will look into pt going to [facility] from the hospital.               Expected Discharge Plan: Skilled Nursing Facility Barriers to Discharge: Continued Medical Work up   Patient Goals and CMS Choice Patient states their goals for this hospitalization and ongoing recovery are:: I don;t know you will need to talk with my niece-[name]. CMS Medicare.gov Compare Post Acute Care list provided to:: Patient Represenative (must comment) ([name]) Choice offered to / list presented to : Patient  Expected Discharge Plan and Services Expected Discharge Plan: Skilled Nursing Facility In-house Referral: Clinical Social Work   Post Acute Care Choice: Skilled Nursing Facility Living arrangements for the past 2 months: Single Family Home                                      Prior Living Arrangements/Services Living arrangements for the past 2 months: Single Family Home Lives with:: Roommate Patient language and need for interpreter reviewed:: No Do you feel safe going back to the place where you  live?: Yes      Need for Family Participation in Patient Care: Yes (Comment) Care giver support system in place?: No (comment) Current home services: DME (has rw and wheelchair) Criminal Activity/Legal Involvement Pertinent to Current Situation/Hospitalization: No - Comment as needed  Activities of Daily Living      Permission Sought/Granted Permission sought to share information with : Facility Medical sales representative, Family Supports Permission granted to share information with : Yes, Verbal Permission Granted  Share Information with NAME: [name]  Permission granted to share info w AGENCY: SNF's  Permission granted to share info w Relationship: niece  Permission granted to share info w Contact Information: Admission  Emotional Assessment Appearance:: Appears stated age Attitude/Demeanor/Rapport: Gracious Affect (typically observed): Accepting, Calm Orientation: : Oriented to Self, Oriented to Place Alcohol / Substance Use: Alcohol Use Psych Involvement: No (comment)  Admission diagnosis:  Cellulitis of buttock [L03.317] Sepsis (HCC) [A41.9] Sepsis, due to unspecified organism, unspecified whether acute organ dysfunction present [facility]) [A41.9] Patient Active Problem List   Diagnosis Date Noted  . Sepsis (HCC) 03/24/2020  . Acute metabolic encephalopathy 12/15/2019  . At high risk for aspiration 12/15/2019  . Pressure injury of skin 11/15/2019  . Protein-calorie malnutrition, severe 11/13/2019  . Malnutrition of moderate degree 08/10/2019  . Alcohol dependence (HCC) 08/09/2019  . Essential  hypertension 08/09/2019  . Metabolic acidosis, increased anion gap (IAG) 08/09/2019  . Prolonged QT interval 08/09/2019  . Hypertensive urgency 07/03/2019  . Acute CHF (congestive heart failure) (HCC) 06/30/2019  . Hypoglycemia 06/30/2019  . SIRS (systemic inflammatory response syndrome) (HCC) 06/30/2019  . Hyponatremia 06/30/2019  . Hypomagnesemia 06/30/2019   [facility]P:  [facility]:   MEDICAL [address] [place], [place] - [address] [place] [address] Phone: [phone] Fax: [phone]     Social Determinants of Health (SDOH) Interventions    Readmission Risk Interventions Readmission Risk Prevention Plan 12/15/2019  Transportation Screening Complete  PCP or Specialist Appt within 3-5 Days Complete  HRI or Home Care Consult Complete  Social Work Consult for Recovery Care Planning/Counseling Complete  Palliative Care Screening Not Applicable  Medication Review Oceanographer) Complete  Some recent data might be hidden

## 2020-03-27 NOTE — TOC Progression Note (Signed)
Transition of Care [facility]) - Progression Note    Patient Details  Name: Javier Robinson MRN: [id number] Date of Birth: 03-12-1946  Transition of Care [facility]) CM/SW Contact  Tarryn Bogdan, Lemar Livings, LCSW Phone Number: 03/27/2020, 10:35 AM  Clinical Narrative:   Attempted to reach out and speak with niece-[name] regarding discharge needs. Have also spoken with pt who is not sure what the plan is and wants this worker to talk with niece. Niece voicemail is full will continue to try to reach her.         Expected Discharge Plan and Services                                                 Social Determinants of Health (SDOH) Interventions    Readmission Risk Interventions Readmission Risk Prevention Plan 12/15/2019  Transportation Screening Complete  PCP or Specialist Appt within 3-5 Days Complete  HRI or Home Care Consult Complete  Social Work Consult for Recovery Care Planning/Counseling Complete  Palliative Care Screening Not Applicable  Medication Review Oceanographer) Complete  Some recent data might be hidden

## 2020-03-27 NOTE — Care Management Important Message (Signed)
Important Message  Patient Details  Name: Javier Robinson MRN: [id number] Date of Birth: [birth year]   Medicare Important Message Given:  Yes     Bernadette Hoit 03/27/2020, 1:19 PM

## 2020-03-28 LAB — CBC
HCT: 21.9 % — ABNORMAL LOW (ref 39.0–52.0)
Hemoglobin: 7.4 g/dL — ABNORMAL LOW (ref 13.0–17.0)
MCH: 27.3 pg (ref 26.0–34.0)
MCHC: 33.8 g/dL (ref 30.0–36.0)
MCV: 80.8 fL (ref 80.0–100.0)
Platelets: 358 10*3/uL (ref 150–400)
RBC: 2.71 MIL/uL — ABNORMAL LOW (ref 4.22–5.81)
RDW: 17.6 % — ABNORMAL HIGH (ref 11.5–15.5)
WBC: 7.9 10*3/uL (ref 4.0–10.5)
nRBC: 0 % (ref 0.0–0.2)

## 2020-03-28 LAB — BASIC METABOLIC PANEL
Anion gap: 9 (ref 5–15)
BUN: 8 mg/dL (ref 8–23)
CO2: 21 mmol/L — ABNORMAL LOW (ref 22–32)
Calcium: 8 mg/dL — ABNORMAL LOW (ref 8.9–10.3)
Chloride: 102 mmol/L (ref 98–111)
Creatinine, Ser: 0.53 mg/dL — ABNORMAL LOW (ref 0.61–1.24)
GFR calc Af Amer: >60 mL/min (ref >60)
GFR calc non Af Amer: >60 mL/min (ref >60)
Glucose, Bld: 75 mg/dL (ref 70–99)
Potassium: 3.5 mmol/L (ref 3.5–5.1)
Sodium: 132 mmol/L — ABNORMAL LOW (ref 135–145)

## 2020-03-28 LAB — MAGNESIUM: Magnesium: 1.9 mg/dL (ref 1.7–2.4)

## 2020-03-28 LAB — SARS CORONAVIRUS 2 BY RT PCR (HOSPITAL ORDER, PERFORMED IN ~~LOC~~ HOSPITAL LAB): SARS Coronavirus 2: NEGATIVE

## 2020-03-28 NOTE — Plan of Care (Signed)
  Problem: Education: Goal: Knowledge of General Education information will improve Description: Including pain rating scale, medication(s)/side effects and non-pharmacologic comfort measures Outcome: Not Progressing   Problem: Clinical Measurements: Goal: Will remain free from infection Outcome: Progressing   Problem: Activity: Goal: Risk for activity intolerance will decrease Outcome: Not Progressing   Problem: Elimination: Goal: Will not experience complications related to bowel motility Outcome: Progressing Goal: Will not experience complications related to urinary retention Outcome: Progressing   Problem: Pain Managment: Goal: General experience of comfort will improve Outcome: Progressing   Problem: Skin Integrity: Goal: Risk for impaired skin integrity will decrease Outcome: Progressing   Problem: Safety: Goal: Ability to remain free from injury will improve Outcome: Progressing

## 2020-03-28 NOTE — TOC Progression Note (Addendum)
Transition of Care [facility]) - Progression Note    Patient Details  Name: ADI DORO MRN: [id number] Date of Birth: December 14, 1945  [facility] of [facility]) CM/SW Contact  Bradd Merlos, Lemar Livings, LCSW Phone Number: 03/28/2020, 11:57 AM  Clinical Narrative:   Spoke with [name] who has chosen [facility] instead of [facility] due to pt can smoke outside. Have contacted [employer] to change facility for auth. Have faxed in clinicals. Await auth. Will ask MD to order COVID test for transfer tomorrow. Ref number [id number]. New ref number [id number]    Expected Discharge Plan: Skilled Nursing Facility Barriers to Discharge: Continued Medical Work up  Expected Discharge Plan and Services Expected Discharge Plan: Skilled Nursing Facility In-house Referral: Clinical Social Work   Post Acute Care Choice: Skilled Nursing Facility Living arrangements for the past 2 months: Single Family Home                                       Social Determinants of Health (SDOH) Interventions    Readmission Risk Interventions Readmission Risk Prevention Plan 12/15/2019  Transportation Screening Complete  PCP or Specialist Appt within 3-5 Days Complete  HRI or Home Care Consult Complete  Social Work Consult for Recovery Care Planning/Counseling Complete  Palliative Care Screening Not Applicable  Medication Review Oceanographer) Complete  Some recent data might be hidden

## 2020-03-28 NOTE — Progress Notes (Signed)
PROGRESS NOTE    Javier Fischerhomas E Heuring  UYQ:[id number] DOB: 1946/01/22 DOA: 03/24/2020 PCP: [facility]    Assessment & Plan:   Principal Problem:   Sepsis [facility](HCC) Active Problems:   Essential hypertension   Pressure injury of skin   Acute metabolic encephalopathy   Javier Robinson is a 74 y.o. AA male with medical history significant for hypertension, GERD, hx of alcohol abuse, tobacco abuse, presented to the ER due to concerns for acute metabolic encephalopathy, large buttock wound, generalized weakness.   Sepsis likely 2/2 large buttock ulceration/wound On admission, tachycardic, tachypneic, leukocytosis, mildly elevated lactic acid Afebrile CT abdomen/pelvis showed inferior right buttock ulceration with surrounding inflammation extending into right thigh and perineum, no abscess, no evidence of necrotizing fasciitis or osteomyelitis UA unremarkable for infection BC x2 pending --started on vanc/cefepime/flagyl, flagyl d/c'ed.  Leukocytosis resolved.  De-escalate abx to augmentin and Bactrim 8/9. --wound care consulted PLAN: --dressing change per wound care specialist rec --continue augmentin and bactrim  Lactic acidosis --resolved with IVF  Acute metabolic encephalopathy 2/2 infection Reported ?Facial droop by caregiver, able to move all extremities spontaneously CT head unremarkable --pt tolerating diet --ABG and ammonia levels wnl --Mental status improved with treatment of infection PLAN: --continue treating infection as above  Normocytic anemia, iron def Hemoglobin around baseline, 9 Anemia panel pos for iron def only PLAN: --continue oral iron  Hypertension --continue home amlodipine  Tobacco abuse --continue nicotine patch  Possible alcohol abuse Monitor closely, may need CIWA protocol  Hypokalemia --mag and phos levels wnl --replete PRN   DVT prophylaxis: Lovenox SQ Code Status: DNR  Family Communication:  Status is:  inpatient Dispo:   The patient is from: home Anticipated d/c is to: SNF rehab Anticipated d/c date is: whenever bed available  Patient currently is medically stable to d/c.   Subjective and Interval History:  Pt was a lot more alert and awake today.  Denied pain.  Did not remember the last several days.  Good oral intake.    Objective: Vitals:   03/27/20 1158 03/27/20 2315 03/28/20 0753 03/28/20 1619  BP: (!) 150/66 123/69 129/65 121/70  Pulse: 84 90 78 85  Resp: 18 14 18 18   Temp: 98.4 F (36.9 C) 98.3 F (36.8 C) 97.6 F (36.4 C) 98.3 F (36.8 C)  TempSrc: Oral Oral Oral Oral  SpO2: 100% 100% 100% 100%  Weight:      Height:        Intake/Output Summary (Last 24 hours) at 03/28/2020 1637 Last data filed at 03/28/2020 1400 Gross per 24 hour  Intake 800 ml  Output 450 ml  Net 350 ml   Filed Weights   03/24/20 1008  Weight: 63.5 kg    Examination:   Constitutional: NAD, alert, appeared coherent HEENT: conjunctivae and lids normal, EOMI CV: RRR no M,R,G. Distal pulses +2.  No cyanosis.   RESP: CTA B/L, normal respiratory effort  GI: +BS, NTND Extremities: No effusions, edema, or tenderness in BLE SKIN: warm, dry and intact Neuro: II - XII grossly intact.      Data Reviewed: I have personally reviewed following labs and imaging studies  CBC: Recent Labs  Lab 03/24/20 1022 03/25/20 0500 03/26/20 0437 03/27/20 0432 03/28/20 0548  WBC 15.0* 14.5* 10.5 9.1 7.9  NEUTROABS 12.1*  --   --   --   --   HGB 9.7* 7.9* 8.3* 7.6* 7.4*  HCT 29.3* 24.5* 26.4* 22.5* 21.9*  MCV 84.0 84.8 85.7 82.4 80.8  PLT 391 316 364 335 358   Basic Metabolic Panel: Recent Labs  Lab 03/24/20 1022 03/25/20 0500 03/26/20 0437 03/27/20 0432 03/28/20 0548  NA 142 142 139 138 132*  K 3.3* 2.8* 2.8* 3.6 3.5  CL 105 110 108 110 102  CO2 24 23 23 23  21*  GLUCOSE 112* 75 86 88 75  BUN 10 9 10 9 8   CREATININE 0.70 0.58* 0.73 0.60* 0.53*  CALCIUM 8.4* 7.8* 7.8* 7.8* 8.0*  MG   --   --  1.9 1.8 1.9  PHOS  --   --  2.5  --   --    GFR: Estimated Creatinine Clearance: 70.6 mL/min (A) (by C-G formula based on SCr of 0.53 mg/dL (L)). Liver Function Tests: Recent Labs  Lab 03/24/20 1022 03/25/20 0500  AST 25 23  ALT 10 9  ALKPHOS 64 49  BILITOT 1.4* 1.2  PROT 8.4* 6.2*  ALBUMIN 3.0* 2.3*   No results for input(s): LIPASE, AMYLASE in the last 168 hours. Recent Labs  Lab 03/27/20 1825  AMMONIA 23   Coagulation Profile: Recent Labs  Lab 03/24/20 1308  INR 1.1   Cardiac Enzymes: No results for input(s): CKTOTAL, CKMB, CKMBINDEX, TROPONINI in the last 168 hours. BNP (last 3 results) No results for input(s): PROBNP in the last 8760 hours. HbA1C: No results for input(s): HGBA1C in the last 72 hours. CBG: Recent Labs  Lab 03/26/20 1147  GLUCAP 94   Lipid Profile: No results for input(s): CHOL, HDL, LDLCALC, TRIG, CHOLHDL, LDLDIRECT in the last 72 hours. Thyroid Function Tests: No results for input(s): TSH, T4TOTAL, FREET4, T3FREE, THYROIDAB in the last 72 hours. Anemia Panel: No results for input(s): VITAMINB12, FOLATE, FERRITIN, TIBC, IRON, RETICCTPCT in the last 72 hours. Sepsis Labs: Recent Labs  Lab 03/24/20 1022 03/24/20 1213 03/25/20 0455 03/25/20 0500 03/26/20 0437 03/27/20 0432  PROCALCITON  --   --   --  0.16 0.14 <0.10  LATICACIDVEN 2.3* 2.0* 1.2  --   --   --     Recent Results (from the past 240 hour(s))  Blood Culture (routine x 2)     Status: None (Preliminary result)   Collection Time: 03/24/20  1:08 PM   Specimen: BLOOD  Result Value Ref Range Status   Specimen Description BLOOD  Final   Special Requests NONE  Final   Culture   Final    NO GROWTH 4 DAYS Performed at [facility], [address], [place], [address] [place]    Report Status PENDING  Incomplete  Urine culture     Status: Abnormal   Collection Time: 03/24/20  1:08 PM   Specimen: In/Out Cath Urine  Result Value Ref Range Status   Specimen  Description   Final    IN/OUT CATH URINE Performed at [facility], [address], [place], [address] [place]    Special Requests   Final    NONE Performed at [facility], [address] [place], [address] [place]    Culture MULTIPLE SPECIES PRESENT, SUGGEST RECOLLECTION (A)  Final   Report Status 03/26/2020 FINAL  Final  SARS Coronavirus 2 by RT PCR (hospital order, performed in [facility] hospital lab) Nasopharyngeal Nasopharyngeal Swab     Status: None   Collection Time: 03/24/20  4:36 PM   Specimen: Nasopharyngeal Swab  Result Value Ref Range Status   SARS Coronavirus 2 NEGATIVE NEGATIVE Final    Comment: (NOTE) SARS-CoV-2 target nucleic acids are NOT DETECTED.  The SARS-CoV-2 RNA is generally detectable in upper and lower respiratory specimens during the acute phase of infection. The lowest concentration of SARS-CoV-2 viral copies this assay can detect is 250 copies / mL. A negative result does not preclude SARS-CoV-2 infection and should not be used as the sole basis for treatment or other patient management decisions.  A negative result may occur with improper specimen collection / handling, submission of specimen other than nasopharyngeal swab, presence of viral mutation(s) within the areas targeted by this assay, and inadequate number of viral copies (<250 copies / mL). A negative result must be combined with clinical observations, patient history, and epidemiological information.  Fact Sheet for Patients:   BoilerBrush.com.cy  Fact Sheet for Healthcare Providers: https://pope.com/  This test is not yet approved or  cleared by the [place] FDA and has been authorized for detection and/or diagnosis of SARS-CoV-2 by FDA under an Emergency Use Authorization (EUA).  This EUA will remain in effect (meaning this test can be used) for the duration of the COVID-19 declaration under Section 564(b)(1)  of the Act, 21 U.S.C. section 360bbb-3(b)(1), unless the authorization is terminated or revoked sooner.  Performed at [facility], [address] [place], [place] [address]   Blood Culture (routine x 2)     Status: None (Preliminary result)   Collection Time: 03/25/20 12:53 AM   Specimen: BLOOD  Result Value Ref Range Status   Specimen Description BLOOD BLOOD RIGHT HAND  Final   Special Requests   Final    BOTTLES DRAWN AEROBIC AND ANAEROBIC Blood Culture adequate volume   Culture   Final    NO GROWTH 3 DAYS Performed at [facility], [address] [place], [place] [address]    Report Status PENDING  Incomplete  SARS Coronavirus 2 by RT PCR (hospital order, performed in [facility] hospital lab) Nasopharyngeal Nasopharyngeal Swab     Status: None   Collection Time: 03/28/20 12:40 PM   Specimen: Nasopharyngeal Swab  Result Value Ref Range Status   SARS Coronavirus 2 NEGATIVE NEGATIVE Final    Comment: (NOTE) SARS-CoV-2 target nucleic acids are NOT DETECTED.  The SARS-CoV-2 RNA is generally detectable in upper and lower respiratory specimens during the acute phase of infection. The lowest concentration of SARS-CoV-2 viral copies this assay can detect is 250 copies / mL. A negative result does not preclude SARS-CoV-2 infection and should not be used as the sole basis for treatment or other patient management decisions.  A negative result may occur with improper specimen collection / handling, submission of specimen other than nasopharyngeal swab, presence of viral mutation(s) within the areas targeted by this assay, and inadequate number of viral copies (<250 copies / mL). A negative result must be combined with clinical observations, patient history, and epidemiological information.  Fact Sheet for Patients:   BoilerBrush.com.cy  Fact Sheet for Healthcare Providers: https://pope.com/  This test is not  yet approved or  cleared by the [place] FDA and has been authorized for detection and/or diagnosis of SARS-CoV-2 by FDA under an Emergency Use Authorization (EUA).  This EUA will remain in effect (meaning this test can be used) for the duration of the COVID-19 declaration under Section 564(b)(1) of the Act, 21 U.S.C. section 360bbb-3(b)(1), unless the authorization is terminated or revoked sooner.  Performed at [facility], [address], [place], [place] [address]       Radiology Studies: No results found.   Scheduled Meds: . amLODipine  5  mg Oral Daily  . amoxicillin-clavulanate  1 tablet Oral Q12H  . enoxaparin (LOVENOX) injection  40 mg Subcutaneous Q24H  . ferrous sulfate  325 mg Oral TID WC  . nicotine  14 mg Transdermal Daily  . sodium chloride flush  3 mL Intravenous Q12H  . sulfamethoxazole-trimethoprim  1 tablet Oral Q12H   Continuous Infusions: . sodium chloride Stopped (03/26/20 1724)     LOS: 4 days     Darlin Priestly, MD [facility] If 7PM-7AM, please contact night-coverage 03/28/2020, 4:37 PM

## 2020-03-28 NOTE — Plan of Care (Signed)
  Problem: Skin Integrity: Goal: Risk for impaired skin integrity will decrease Outcome: Progressing   

## 2020-03-29 LAB — BASIC METABOLIC PANEL
Anion gap: 8 (ref 5–15)
BUN: 7 mg/dL — ABNORMAL LOW (ref 8–23)
CO2: 24 mmol/L (ref 22–32)
Calcium: 8.1 mg/dL — ABNORMAL LOW (ref 8.9–10.3)
Chloride: 98 mmol/L (ref 98–111)
Creatinine, Ser: 0.59 mg/dL — ABNORMAL LOW (ref 0.61–1.24)
GFR calc Af Amer: >60 mL/min (ref >60)
GFR calc non Af Amer: >60 mL/min (ref >60)
Glucose, Bld: 65 mg/dL — ABNORMAL LOW (ref 70–99)
Potassium: 3.7 mmol/L (ref 3.5–5.1)
Sodium: 130 mmol/L — ABNORMAL LOW (ref 135–145)

## 2020-03-29 LAB — CBC
HCT: 22.9 % — ABNORMAL LOW (ref 39.0–52.0)
Hemoglobin: 7.5 g/dL — ABNORMAL LOW (ref 13.0–17.0)
MCH: 27.1 pg (ref 26.0–34.0)
MCHC: 32.8 g/dL (ref 30.0–36.0)
MCV: 82.7 fL (ref 80.0–100.0)
Platelets: 360 10*3/uL (ref 150–400)
RBC: 2.77 MIL/uL — ABNORMAL LOW (ref 4.22–5.81)
RDW: 17.3 % — ABNORMAL HIGH (ref 11.5–15.5)
WBC: 6.5 10*3/uL (ref 4.0–10.5)
nRBC: 0 % (ref 0.0–0.2)

## 2020-03-29 LAB — CULTURE, BLOOD (ROUTINE X 2): Culture: NO GROWTH

## 2020-03-29 LAB — MAGNESIUM: Magnesium: 1.9 mg/dL (ref 1.7–2.4)

## 2020-03-29 MED ORDER — NICOTINE 14 MG/24HR TD PT24: 14.0000 mg | MEDICATED_PATCH | Freq: Every day | TRANSDERMAL | 0 refills | Status: AC

## 2020-03-29 MED ORDER — FERROUS SULFATE 325 (65 FE) MG PO TABS: 325.0000 mg | ORAL_TABLET | Freq: Three times a day (TID) | ORAL | 3 refills | Status: DC

## 2020-03-29 MED ORDER — SULFAMETHOXAZOLE-TRIMETHOPRIM 400-80 MG PO TABS: 1.0000 | ORAL_TABLET | Freq: Two times a day (BID) | ORAL | 0 refills | Status: AC

## 2020-03-29 MED ORDER — AMOXICILLIN-POT CLAVULANATE 875-125 MG PO TABS: 1.0000 | ORAL_TABLET | Freq: Two times a day (BID) | ORAL | 0 refills | Status: AC

## 2020-03-29 NOTE — Care Management Important Message (Signed)
Important Message  Patient Details  Name: Javier Robinson MRN: [id number] Date of Birth: [birth year]   Medicare Important Message Given:  Yes  Chauncey Cruel gave her permission for verbal approval on 8 11 21.  Fabio Pierce     Harshita Bernales, Stephan Minister 03/29/2020, 10:47 AM

## 2020-03-29 NOTE — Progress Notes (Signed)
Occupational Therapy Treatment Patient Details Name:  Javier Robinson MRN: [id number] DOB: 10-17-1945 Today's Date: 03/29/2020    History of present illness Per MD note:Javier Robinson is a 74 y.o. male with medical history significant for hypertension, GERD, hx of alcohol abuse, tobacco abuse, presented to the ER due to concerns for acute metabolic encephalopathy, large buttock wound, generalized weakness. Currently due to encephalopathy, patient unable to provide any history.  I was able to get in touch with patient's niece Javier Robinson, who was not able to give me much information as patient lives by himself, with the roommates and has a caregiver who goes to his house daily to give him a bath.  Of note, patient's niece was aware of his buttock wound, no medical care was given.  Caregiver noted patient to be more confused and thought he had facial droop therefore called EMS.  Patient has been spending most of his time in bed for the past month as per niece, was previously able to ambulate with a walker with assistance.  Further history obtained from chart review and EDP.    OT comments  Pt seen for OT/PT co-treatment on this date. Upon arrival to room pt awake semi-reclined in bed with PT in to begin treatment session. Pt generally agreeable to attempting functional mobility and denies pain this date. He states he would like to move if he has assistance. OT/PT provide assistance for pt to come to sitting EOB. He requires variable assist to maintain seated balance t/o session and fatigues quickly, on one instance laying back onto bed stating he is tired. After therapeutic rest break, pt agreeable to attempting sup>sit t/f again, and is able to maintain seated balance with CGA for safety and performs seated ADL as described below (See ADL section for additional detail). OT/PT facilitate functional transfer attempts. Pt ultimately requires MAX A to come to standing with heavy BUE use on RW, pt is unable to maintain  upright position w/o assist this date. Pt once again, fatigues quickly and requires assist to return to semi-supine in bed. During transfers, pt noted to have soiled bedding/sacral bandages. RN notified of pt need for new bandages and clean up due to active BM during therapy session. Pt progressing toward OT goals, and continues to benefit from skilled OT services to maximize return to PLOF and minimize risk of future falls, injury, caregiver burden, and readmission. Will continue to follow POC. Discharge recommendation remains appropriate.    Follow Up Recommendations  SNF    Equipment Recommendations  3 in 1 bedside commode    Recommendations for Other Services      Precautions / Restrictions Precautions Precautions: Fall Precaution Comments: High Fall Restrictions Weight Bearing Restrictions: No       Mobility Bed Mobility Overal bed mobility: Needs Assistance Bed Mobility: Rolling;Supine to Sit;Sit to Supine Rolling: +2 for physical assistance;Min assist   Supine to sit: +2 for physical assistance;Mod assist;Min assist Sit to supine: +2 for physical assistance;Mod assist;Min assist   General bed mobility comments: verbal cues for technique, task initiaion, and sequencing. increased time required to complete tasks.   Transfers Overall transfer level: Needs assistance Equipment used: Rolling walker (2 wheeled) Transfers: Sit to/from Stand Sit to Stand: Max assist;+2 physical assistance         General transfer comment: able to get squat standing with mod+2 assistance and elevated surface. Unable to stand from bed in low position with max +2 assist.    Balance Overall balance assessment: Needs assistance  Sitting-balance support: Feet supported;Single extremity supported Sitting balance-Leahy Scale: Fair Sitting balance - Comments: able to sit without assistance briefly, but was guarded at knees to ensure he did not slide off air bed.   Standing balance support:  Bilateral upper extremity supported;During functional activity Standing balance-Leahy Scale: Poor Standing balance comment: reliant on RW and external assist                           ADL either performed or assessed with clinical judgement   ADL Overall ADL's : Needs assistance/impaired     Grooming: Sitting;Bed level;Wash/dry face;Oral care Grooming Details (indicate cue type and reason): Pt fatigues quickly with seated ADL management. He requres MAX A to come to sitting at EOB with min guard to MIN A t/o for seated balance. He is able to wash his face when given a washcloth by therapist. He demonstrates decreased interest in ADL participation and requires moderate encouragement t/o. Pt returns to bed level to perform oral care given set-up assist and moderate cueing for sequencing from therapist.                             Functional mobility during ADLs: +2 for safety/equipment;+2 for physical assistance;Maximal assistance General ADL Comments: Pt continues to require MAX A for bed mobility. MOD/MAX A +2 for functional transfers. Pt limited by cognition, generalized weakness, and poor activity tolerance. Set-up assist for bed level grooming (facewashing) at time of OT evaluation. MOD A to assist with donning facemask.     Vision       Perception     Praxis      Cognition Arousal/Alertness: Lethargic Behavior During Therapy: Flat affect Overall Cognitive Status: No family/caregiver present to determine baseline cognitive functioning                                 General Comments: able to follow single step commands with increased time         Exercises Other Exercises Other Exercises: Heel slides, AP x 5 reps each with cues Other Exercises: Seated/bed level UB grooming, bed mobility/transfers. See ADL section for additional detail.   Shoulder Instructions       General Comments Bandages at buttocks noted to be soiled from [facility], RN  notified.    Pertinent Vitals/ Pain       Pain Assessment: No/denies pain  Home Living Family/patient expects to be discharged to:: Private residence Living Arrangements: Non-relatives/Friends                                      Prior Functioning/Environment              Frequency  Min 2X/week        Progress Toward Goals  OT Goals(current goals can now be found in the care plan section)  Progress towards OT goals: Progressing toward goals  Acute Rehab OT Goals Patient Stated Goal: to get stronger  OT Goal Formulation: With patient Time For Goal Achievement: 04/08/20 Potential to Achieve Goals: Good  Plan Discharge plan remains appropriate;Frequency remains appropriate    Co-evaluation    PT/OT/SLP Co-Evaluation/Treatment: Yes Reason for Co-Treatment: To address functional/ADL transfers;For patient/therapist safety PT goals addressed during session: Mobility/safety with mobility;Balance OT goals addressed  during session: ADL's and self-care      AM-PAC OT "6 Clicks" Daily Activity     Outcome Measure   Help from another person eating meals?: A Little Help from another person taking care of personal grooming?: A Little Help from another person toileting, which includes using toliet, bedpan, or urinal?: Total Help from another person bathing (including washing, rinsing, drying)?: A Lot Help from another person to put on and taking off regular upper body clothing?: A Lot Help from another person to put on and taking off regular lower body clothing?: A Lot 6 Click Score: 13    End of Session Equipment Utilized During Treatment: Gait belt;Rolling walker  OT Visit Diagnosis: Other abnormalities of gait and mobility (R26.89);Muscle weakness (generalized) (M62.81);Other symptoms and signs involving cognitive function   Activity Tolerance Patient limited by lethargy   Patient Left in bed;with bed alarm set;with call bell/phone within reach    Nurse Communication Mobility status        Time: 0737-1062 OT Time Calculation (min): 24 min  Charges: OT General Charges $OT Visit: 1 Visit OT Treatments $Self Care/Home Management : 8-22 mins  Rockney Ghee, M.S., OTR/L Ascom: [phone] 03/29/20, 12:33 PM

## 2020-03-29 NOTE — Progress Notes (Signed)
Pt d/c to [facility] this afternoon via EMS.  NAD noted at d/c all VS WNL.  Wounds to skin were cleansed and dressed prior to pt leave hospital.  All belongings taken at time of d/c.  IV removed from R wrist and L AC without issue.  Report called to nurse Reece Leader at [facility].

## 2020-03-29 NOTE — Progress Notes (Signed)
Physical Therapy Treatment Patient Details Name: Javier Robinson MRN: [id number] DOB: [birth year] Today's Date: 03/29/2020    History of Present Illness Per MD note:Javier Robinson is a 74 y.o. male with medical history significant for hypertension, GERD, hx of alcohol abuse, tobacco abuse, presented to the ER due to concerns for acute metabolic encephalopathy, large buttock wound, generalized weakness. Currently due to encephalopathy, patient unable to provide any history.  I was able to get in touch with patient's niece Javier Robinson, who was not able to give me much information as patient lives by himself, with the roommates and has a caregiver who goes to his house daily to give him a bath.  Of note, patient's niece was aware of his buttock wound, no medical care was given.  Caregiver noted patient to be more confused and thought he had facial droop therefore called EMS.  Patient has been spending most of his time in bed for the past month as per niece, was previously able to ambulate with a walker with assistance.  Further history obtained from chart review and EDP.     PT Comments    Patient received in bed, agreeable to PT session. Requires cues for mobility initiation. Able to bring LEs off bed, min assist needed to raise trunk to seated position. Cues to hold to bed rail for support. He is able to sit unsupported once assisted to safe position on edge of bed. He performed sit to stand from elevated bed with rw and mod +2 assist. Attempted standing with max +2 from lower bed position, but patient unable. He will continue to benefit from skilled PT while here to improve strength and functional independence.    Follow Up Recommendations  SNF     Equipment Recommendations  Rolling walker with 5" wheels    Recommendations for Other Services       Precautions / Restrictions Precautions Precautions: Fall Precaution Comments: High Fall Restrictions Weight Bearing Restrictions: No    Mobility  Bed  Mobility Overal bed mobility: Needs Assistance Bed Mobility: Rolling;Supine to Sit;Sit to Supine Rolling: Min assist;+2 for physical assistance   Supine to sit: Min assist;+2 for physical assistance Sit to supine: Min assist;+2 for physical assistance   General bed mobility comments: verbal cues for technique, task initiaion, and sequencing. increased time required to complete tasks.   Transfers Overall transfer level: Needs assistance Equipment used: Rolling walker (2 wheeled) Transfers: Sit to/from Stand Sit to Stand: Max assist         General transfer comment: able to get squat standing with mod+2 assistance and elevated surface. Unable to stand from bed in low position with max +2 assist.  Ambulation/Gait             General Gait Details: unable   Stairs             Wheelchair Mobility    Modified Rankin (Stroke Patients Only)       Balance Overall balance assessment: Needs assistance Sitting-balance support: Feet supported;Single extremity supported Sitting balance-Leahy Scale: Fair Sitting balance - Comments: able to sit without assistance, but was guarded at knees to ensure he did not slide off bed.   Standing balance support: Bilateral upper extremity supported;During functional activity Standing balance-Leahy Scale: Poor Standing balance comment: reliant on RW and external assist                            Cognition Arousal/Alertness: Lethargic Behavior During  Therapy: Flat affect Overall Cognitive Status: No family/caregiver present to determine baseline cognitive functioning                                 General Comments: able to follow single step commands with increased time       Exercises Other Exercises Other Exercises: Heel slides, AP x 5 reps each with cues    General Comments        Pertinent Vitals/Pain Pain Assessment: No/denies pain    Home Living                      Prior  Function            PT Goals (current goals can now be found in the care plan section) Acute Rehab PT Goals Patient Stated Goal: to get stronger  PT Goal Formulation: Patient unable to participate in goal setting Time For Goal Achievement: 04/08/20 Progress towards PT goals: Progressing toward goals    Frequency    Min 2X/week      PT Plan Current plan remains appropriate    Co-evaluation PT/OT/SLP Co-Evaluation/Treatment: Yes Reason for Co-Treatment: For patient/therapist safety;To address functional/ADL transfers PT goals addressed during session: Mobility/safety with mobility;Balance        AM-PAC PT "6 Clicks" Mobility   Outcome Measure  Help needed turning from your back to your side while in a flat bed without using bedrails?: A Lot Help needed moving from lying on your back to sitting on the side of a flat bed without using bedrails?: A Lot Help needed moving to and from a bed to a chair (including a wheelchair)?: A Lot Help needed standing up from a chair using your arms (e.g., wheelchair or bedside chair)?: A Lot Help needed to walk in hospital room?: Total Help needed climbing 3-5 steps with a railing? : Total 6 Click Score: 10    End of Session Equipment Utilized During Treatment: Gait belt Activity Tolerance: Patient limited by fatigue Patient left: in bed;with call bell/phone within reach Nurse Communication: Mobility status PT Visit Diagnosis: Other abnormalities of gait and mobility (R26.89);Muscle weakness (generalized) (M62.81);Difficulty in walking, not elsewhere classified (R26.2)     Time: 4742-5956 PT Time Calculation (min) (ACUTE ONLY): 22 min  Charges:  $Therapeutic Activity: 8-22 mins                     Javier Robinson, PT, GCS 03/29/20,10:55 AM

## 2020-03-29 NOTE — Discharge Summary (Signed)
Physician Discharge Summary  Javier Fischerhomas E Tendler BJY:[id number] DOB: 1946-03-10 DOA: 03/24/2020  PCP: [facility]  Admit date: 03/24/2020 Discharge date: 03/29/2020  Admitted From: Home Disposition:  SNF- [facility]  Recommendations for Outpatient Follow-up:  1. Follow up with PCP in 1-2 weeks 2.   Home Health:No Equipment/Devices:None Discharge Condition:Stable CODE STATUS:DNR Diet recommendation: Heart Healthy / Carb Modified Brief/Interim Summary: HPI: Javier Robinson is a 74 y.o. male with medical history significant for hypertension, GERD, hx of alcohol abuse, tobacco abuse, presented to the ER due to concerns for acute metabolic encephalopathy, large buttock wound, generalized weakness. Currently due to encephalopathy, patient unable to provide any history.  I was able to get in touch with patient's niece [name], who was not able to give me much information as patient lives by himself, with the roommates and has a caregiver who goes to his house daily to give him a bath.  Of note, patient's niece was aware of his buttock wound, no medical care was given.  Caregiver noted patient to be more confused and thought he had facial droop therefore called EMS.  Patient has been spending most of his time in bed for the past month as per niece, was previously able to ambulate with a walker with assistance.  Further history obtained from chart review and EDP.   8/11: Patient was treated with intravenous antibiotics with resolution of septic focus.  Cultures were monitored throughout hospital course and remain negative.  Sepsis resolved at time of discharge.  Mental status also improved.  Per the patient's niece patient is a risk for discharge to a monitor setting for now.  Approved for skilled nursing facility placement.  Will discharge to skilled nursing facility in stable condition.  Will complete additional 5 days of dual antibiotic therapy with Augmentin and Bactrim.  Remainder  for medications unchanged.  Discharge Diagnoses:  Principal Problem:   Sepsis (HCC) Active Problems:   Essential hypertension   Pressure injury of skin   Acute metabolic encephalopathy  Sepsis likely 2/2 large buttock ulceration/wound, resolved On admission, tachycardic, tachypneic, leukocytosis,mildly elevated lactic acid Afebrile CT abdomen/pelvis showed inferior right buttock ulcerationwith surrounding inflammation extending into right thigh and perineum, no abscess, no evidence of necrotizing fasciitis or osteomyelitis UA unremarkable for infection BC x2 no growth to date --started on vanc/cefepime/flagyl, flagyl d/c'ed.  Leukocytosis resolved.  De-escalate abx to augmentin and Bactrim 8/9. --wound care consulted.  Recommendations and discharge instructions Discharge plan: --dressing change per wound care specialist rec --continue augmentin and bactrim.  Additional 5 days post discharge prescribed  Lactic acidosis --resolved with IVF  Acute metabolic encephalopathy 2/2 infection Reported?Facial droop by caregiver,able to move all extremities spontaneously CT head unremarkable --pt tolerating diet --ABG and ammonia levels wnl --Mental status improved with treatment of infection PLAN: --continue treating infection as above -Mental status at baseline at time of discharge  Normocytic anemia, iron def Hemoglobin around baseline, 9 Anemia panel pos for iron def only PLAN: --continue oral iron.  Prescribed on discharge  Hypertension --continue home amlodipine.  Continue on discharge  Tobacco abuse --continue nicotine patch.  Counseled on smoking cessation.  Nicotine patches continued on discharge  Possible alcohol abuse No evidence of clinical withdrawals during hospitalization  Hypokalemia, resolved --mag and phos levels wnl --replete PRN  Discharge Instructions  Discharge Instructions    Diet - low sodium heart healthy   Complete by: As directed     Discharge wound care:   Complete by: As directed  Reason for Consult:Stage 3 sacral wound, present on admission.  Wound type:Stage 3 pressure Pressure Injury POA: Yes Measurement:2.5 cm x 2 cm x 0.3 cm  Wound QJJ:HERD pink nongranulating Drainage (amount, consistency, odor) minimal serosanguinous  Musty odor  Dry skin Periwound:dry skin Dressing procedure/placement/frequency: Cleanse sacral wound with NS and pat dry. Apply small piece alginate to wound bed. Cover with sacral foam.  Change every three days and PRN soilage.   Increase activity slowly   Complete by: As directed      Allergies as of 03/29/2020   No Known Allergies     Medication List    STOP taking these medications   pantoprazole 40 MG tablet Commonly known as: PROTONIX     TAKE these medications   amLODipine 5 MG tablet Commonly known as: NORVASC Take 1 tablet (5 mg total) by mouth daily.   amoxicillin-clavulanate 875-125 MG tablet Commonly known as: AUGMENTIN Take 1 tablet by mouth every 12 (twelve) hours for 5 days.   ferrous sulfate 325 (65 FE) MG tablet Take 1 tablet (325 mg total) by mouth 3 (three) times daily with meals.   folic acid 1 MG tablet Commonly known as: FOLVITE Take 1 tablet (1 mg total) by mouth daily.   multivitamin with minerals Tabs tablet Take 1 tablet by mouth daily.   Nepro Liqd Take 1 Bottle by mouth 3 (three) times daily with meals.   nicotine 14 mg/24hr patch Commonly known as: NICODERM CQ - dosed in mg/24 hours Place 1 patch (14 mg total) onto the skin daily. Start taking on: March 30, 2020   sodium chloride 1 g tablet Take 1 tablet (1 g total) by mouth 3 (three) times daily with meals.   sulfamethoxazole-trimethoprim 400-80 MG tablet Commonly known as: BACTRIM Take 1 tablet by mouth every 12 (twelve) hours for 5 days.   tamsulosin 0.4 MG Caps capsule Commonly known as: FLOMAX Take 1 capsule (0.4 mg total) by mouth daily after supper.   thiamine 100 MG  tablet Take 1 tablet (100 mg total) by mouth daily.            Discharge Care Instructions  (From admission, onward)         Start     Ordered   03/29/20 0000  Discharge wound care:       Comments: Reason for Consult:Stage 3 sacral wound, present on admission.  Wound type:Stage 3 pressure Pressure Injury POA: Yes Measurement:2.5 cm x 2 cm x 0.3 cm  Wound EYC:XKGY pink nongranulating Drainage (amount, consistency, odor) minimal serosanguinous  Musty odor  Dry skin Periwound:dry skin Dressing procedure/placement/frequency: Cleanse sacral wound with NS and pat dry. Apply small piece alginate to wound bed. Cover with sacral foam.  Change every three days and PRN soilage.   03/29/20 1106          Contact information for follow-up providers    [facility] Schedule an appointment as soon as possible for a visit in 1 week(s).   Specialty: General Practice Contact information: [address] [place] [address] [phone]            Contact information for after-discharge care    Destination    [facility] Preferred SNF .   Service: Skilled Nursing Contact information: [address] [place] [address] [phone]                 No Known Allergies  Consultations:  Wound and ostomy  Procedures/Studies: DG Chest 2 View  Result Date: 03/24/2020 CLINICAL DATA:  Altered mental status. EXAM: CHEST - 2 VIEW COMPARISON:  12/15/2019. FINDINGS: Mediastinum hilar structures normal. Stable cardiomegaly. No pulmonary venous congestion. No focal infiltrate. No pleural effusion or pneumothorax. Degenerative changes scoliosis thoracic spine. Degenerative changes both shoulders. Stable sclerotic changes right proximal humerus most likely secondary to infarct or enchondroma. IMPRESSION: Stable cardiomegaly. No acute cardiopulmonary disease. Previously identified pulmonary venous congestion has cleared.  Electronically Signed   By: Maisie Fus  Register   On: 03/24/2020 10:39   CT Head Wo Contrast  Result Date: 03/24/2020 CLINICAL DATA:  Weakness, altered mental status EXAM: CT HEAD WITHOUT CONTRAST TECHNIQUE: Contiguous axial images were obtained from the base of the skull through the vertex without intravenous contrast. COMPARISON:  12/06/2019 FINDINGS: Brain: No evidence of acute infarction, hemorrhage, hydrocephalus, extra-axial collection or mass lesion/mass effect. 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 Moderate low-density changes within the periventricular and subcortical white matter compatible with chronic microvascular ischemic change. Moderate diffuse cerebral volume loss. Vascular: Atherosclerotic calcifications involving the large vessels of the skull base. No unexpected hyperdense vessel. Skull: Normal. Negative for fracture or focal lesion. Sinuses/Orbits: No acute finding. Other: None. IMPRESSION: 1. No acute intracranial findings. 2. Chronic microvascular ischemic change and cerebral volume loss. Electronically Signed   By: Duanne Guess D.O.   On: 03/24/2020 13:55   CT ABDOMEN PELVIS W CONTRAST  Result Date: 03/24/2020 CLINICAL DATA:  Buttock wound. EXAM: CT ABDOMEN AND PELVIS WITH CONTRAST TECHNIQUE: Multidetector CT imaging of the abdomen and pelvis was performed using the standard protocol following bolus administration of intravenous contrast. CONTRAST:  75mL OMNIPAQUE IOHEXOL 300 MG/ML  SOLN COMPARISON:  Abdominal ultrasound 12/07/2019 FINDINGS: The study is mildly motion degraded. Lower chest: Minimal atelectasis or scarring in the lung bases. No pleural effusion. Three-vessel coronary atherosclerosis. Hepatobiliary: No focal liver abnormality is seen. Unremarkable gallbladder. No biliary dilatation. Pancreas: Unremarkable. Spleen: Unremarkable. Adrenals/Urinary Tract: Unremarkable adrenal glands. No evidence of renal mass or hydronephrosis. Punctate nonobstructing calculus  or vascular calcification in the right renal hilum. Unremarkable bladder. Stomach/Bowel: The stomach is unremarkable. There is a moderate amount of stool in the rectum with a small amount of scattered stool in the colon. There is no evidence of bowel obstruction. Evaluation for bowel inflammation is limited by motion, paucity of abdominal fat, and absence of oral contrast. Unremarkable appendix. Vascular/Lymphatic: Abdominal aortic atherosclerosis without aneurysm. Heavily calcified plaque at the celiac, superior mesenteric, and bilateral renal artery origins with potentially flow limiting stenoses. No enlarged lymph nodes. Reproductive: Mildly enlarged prostate. Other: No ascites or pneumoperitoneum. Right buttock soft tissue ulceration inferior to the ischium with surrounding soft tissue thickening and stranding extending into the included portion of the right thigh and perineum. No fluid collection, dissecting subcutaneous emphysema, or regional osseous destruction identified. 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 IMPRESSION: 1. Inferior right buttock ulceration with surrounding inflammation extending into the included portion of the right thigh and perineum. No abscess. No evidence of necrotizing fasciitis or osteomyelitis. 2. Moderate rectal stool burden. 3. Aortic Atherosclerosis (ICD10-I70.0). Electronically Signed   By: Sebastian Ache M.D.   On: 03/24/2020 14:00    (Echo, Carotid, EGD, Colonoscopy, ERCP)    Subjective:   Discharge Exam: Vitals:   03/28/20 2343 03/29/20 0725  BP: (!) 124/55 116/62  Pulse: 78 80  Resp: 14 20  Temp: 98.1 F (36.7 C) 98.9 F (37.2 C)  SpO2: 98% 98%   Vitals:  03/28/20 0753 03/28/20 1619 03/28/20 2343 03/29/20 0725  BP: 129/65 121/70 (!) 124/55 116/62  Pulse: 78 85 78 80  Resp: 18 18 14 20   Temp: 97.6 F (36.4 C) 98.3 F (36.8 C) 98.1 F (36.7 C) 98.9 F (37.2 C)  TempSrc: Oral Oral Oral   SpO2: 100% 100% 98% 98%  Weight:       Height:        General: Pt is alert, awake, not in acute distress Cardiovascular: RRR, S1/S2 +, no rubs, no gallops Respiratory: CTA bilaterally, no wheezing, no rhonchi Abdominal: Soft, NT, ND, bowel sounds + Extremities: no edema, no cyanosis, large buttock wound stage III    The results of significant diagnostics from this hospitalization (including imaging, microbiology, ancillary and laboratory) are listed below for reference.     Microbiology: Recent Results (from the past 240 hour(s))  Blood Culture (routine x 2)     Status: None   Collection Time: 03/24/20  1:08 PM   Specimen: BLOOD  Result Value Ref Range Status   Specimen Description BLOOD  Final   Special Requests NONE  Final   Culture   Final    NO GROWTH 5 DAYS Performed at [facility], [address], [place], [place] [address]    Report Status 03/29/2020 FINAL  Final  Urine culture     Status: Abnormal   Collection Time: 03/24/20  1:08 PM   Specimen: In/Out Cath Urine  Result Value Ref Range Status   Specimen Description   Final    IN/OUT CATH URINE Performed at [facility], [address] [place], [place] [address]    Special Requests   Final    NONE Performed at [facility], [address], [place], [place] [address]    Culture MULTIPLE SPECIES PRESENT, SUGGEST RECOLLECTION (A)  Final   Report Status 03/26/2020 FINAL  Final  SARS Coronavirus 2 by RT PCR (hospital order, performed in [facility] hospital lab) Nasopharyngeal Nasopharyngeal Swab     Status: None   Collection Time: 03/24/20  4:36 PM   Specimen: Nasopharyngeal Swab  Result Value Ref Range Status   SARS Coronavirus 2 NEGATIVE NEGATIVE Final    Comment: (NOTE) SARS-CoV-2 target nucleic acids are NOT DETECTED.  The SARS-CoV-2 RNA is generally detectable in upper and lower respiratory specimens during the acute phase of infection. The lowest concentration of SARS-CoV-2 viral copies this assay can detect  is 250 copies / mL. A negative result does not preclude SARS-CoV-2 infection and should not be used as the sole basis for treatment or other patient management decisions.  A negative result may occur with improper specimen collection / handling, submission of specimen other than nasopharyngeal swab, presence of viral mutation(s) within the areas targeted by this assay, and inadequate number of viral copies (<250 copies / mL). A negative result must be combined with clinical observations, patient history, and epidemiological information.  Fact Sheet for Patients:   BoilerBrush.com.cy  Fact Sheet for Healthcare Providers: https://pope.com/  This test is not yet approved or  cleared by the [place] FDA and has been authorized for detection and/or diagnosis of SARS-CoV-2 by FDA under an Emergency Use Authorization (EUA).  This EUA will remain in effect (meaning this test can be used) for the duration of the COVID-19 declaration under Section 564(b)(1) of the Act, 21 U.S.C. section 360bbb-3(b)(1), unless the authorization is terminated or revoked sooner.  Performed at [facility], [address], [place], [place] [address]  Blood Culture (routine x 2)     Status: None (Preliminary result)   Collection Time: 03/25/20 12:53 AM   Specimen: BLOOD  Result Value Ref Range Status   Specimen Description BLOOD BLOOD RIGHT HAND  Final   Special Requests   Final    BOTTLES DRAWN AEROBIC AND ANAEROBIC Blood Culture adequate volume   Culture   Final    NO GROWTH 4 DAYS Performed at [facility], [address], [place], [place] [address]    Report Status PENDING  Incomplete  SARS Coronavirus 2 by RT PCR (hospital order, performed in [facility] hospital lab) Nasopharyngeal Nasopharyngeal Swab     Status: None   Collection Time: 03/28/20 12:40 PM   Specimen: Nasopharyngeal Swab  Result Value Ref Range Status   SARS  Coronavirus 2 NEGATIVE NEGATIVE Final    Comment: (NOTE) SARS-CoV-2 target nucleic acids are NOT DETECTED.  The SARS-CoV-2 RNA is generally detectable in upper and lower respiratory specimens during the acute phase of infection. The lowest concentration of SARS-CoV-2 viral copies this assay can detect is 250 copies / mL. A negative result does not preclude SARS-CoV-2 infection and should not be used as the sole basis for treatment or other patient management decisions.  A negative result may occur with improper specimen collection / handling, submission of specimen other than nasopharyngeal swab, presence of viral mutation(s) within the areas targeted by this assay, and inadequate number of viral copies (<250 copies / mL). A negative result must be combined with clinical observations, patient history, and epidemiological information.  Fact Sheet for Patients:   BoilerBrush.com.cy  Fact Sheet for Healthcare Providers: https://pope.com/  This test is not yet approved or  cleared by the [place] FDA and has been authorized for detection and/or diagnosis of SARS-CoV-2 by FDA under an Emergency Use Authorization (EUA).  This EUA will remain in effect (meaning this test can be used) for the duration of the COVID-19 declaration under Section 564(b)(1) of the Act, 21 U.S.C. section 360bbb-3(b)(1), unless the authorization is terminated or revoked sooner.  Performed at [facility], [address] [place], [place] [address]      Labs: BNP (last 3 results) Recent Labs    08/12/19 1239 11/11/19 1251  BNP 105.0* 59.0   Basic Metabolic Panel: Recent Labs  Lab 03/25/20 0500 03/26/20 0437 03/27/20 0432 03/28/20 0548 03/29/20 0357  NA 142 139 138 132* 130*  K 2.8* 2.8* 3.6 3.5 3.7  CL 110 108 110 102 98  CO2 23 23 23  21* 24  GLUCOSE 75 86 88 75 65*  BUN 9 10 9 8  7*  CREATININE 0.58* 0.73 0.60* 0.53* 0.59*   CALCIUM 7.8* 7.8* 7.8* 8.0* 8.1*  MG  --  1.9 1.8 1.9 1.9  PHOS  --  2.5  --   --   --    Liver Function Tests: Recent Labs  Lab 03/24/20 1022 03/25/20 0500  AST 25 23  ALT 10 9  ALKPHOS 64 49  BILITOT 1.4* 1.2  PROT 8.4* 6.2*  ALBUMIN 3.0* 2.3*   No results for input(s): LIPASE, AMYLASE in the last 168 hours. Recent Labs  Lab 03/27/20 1825  AMMONIA 23   CBC: Recent Labs  Lab 03/24/20 1022 03/24/20 1022 03/25/20 0500 03/26/20 0437 03/27/20 0432 03/28/20 0548 03/29/20 0357  WBC 15.0*   < > 14.5* 10.5 9.1 7.9 6.5  NEUTROABS 12.1*  --   --   --   --   --   --  HGB 9.7*   < > 7.9* 8.3* 7.6* 7.4* 7.5*  HCT 29.3*   < > 24.5* 26.4* 22.5* 21.9* 22.9*  MCV 84.0   < > 84.8 85.7 82.4 80.8 82.7  PLT 391   < > 316 364 335 358 360   < > = values in this interval not displayed.   Cardiac Enzymes: No results for input(s): CKTOTAL, CKMB, CKMBINDEX, TROPONINI in the last 168 hours. BNP: Invalid input(s): POCBNP CBG: Recent Labs  Lab 03/26/20 1147  GLUCAP 94   D-Dimer No results for input(s): DDIMER in the last 72 hours. Hgb A1c No results for input(s): HGBA1C in the last 72 hours. Lipid Profile No results for input(s): CHOL, HDL, LDLCALC, TRIG, CHOLHDL, LDLDIRECT in the last 72 hours. Thyroid function studies No results for input(s): TSH, T4TOTAL, T3FREE, THYROIDAB in the last 72 hours.  Invalid input(s): FREET3 Anemia work up No results for input(s): VITAMINB12, FOLATE, FERRITIN, TIBC, IRON, RETICCTPCT in the last 72 hours. Urinalysis    Component Value Date/Time   COLORURINE AMBER (A) 03/24/2020 1308   APPEARANCEUR CLOUDY (A) 03/24/2020 1308   LABSPEC 1.025 03/24/2020 1308   PHURINE 6.0 03/24/2020 1308   GLUCOSEU NEGATIVE 03/24/2020 1308   HGBUR SMALL (A) 03/24/2020 1308   BILIRUBINUR NEGATIVE 03/24/2020 1308   KETONESUR 5 (A) 03/24/2020 1308   PROTEINUR 30 (A) 03/24/2020 1308   UROBILINOGEN 0.2 02/18/2014 1720   NITRITE NEGATIVE 03/24/2020 1308    LEUKOCYTESUR TRACE (A) 03/24/2020 1308   Sepsis Labs Invalid input(s): PROCALCITONIN,  WBC,  LACTICIDVEN Microbiology Recent Results (from the past 240 hour(s))  Blood Culture (routine x 2)     Status: None   Collection Time: 03/24/20  1:08 PM   Specimen: BLOOD  Result Value Ref Range Status   Specimen Description BLOOD  Final   Special Requests NONE  Final   Culture   Final    NO GROWTH 5 DAYS Performed at [facility], [address], [place], [place] [address]    Report Status 03/29/2020 FINAL  Final  Urine culture     Status: Abnormal   Collection Time: 03/24/20  1:08 PM   Specimen: In/Out Cath Urine  Result Value Ref Range Status   Specimen Description   Final    IN/OUT CATH URINE Performed at [facility], [address] [place], [place] [address]    Special Requests   Final    NONE Performed at [facility], [address] [place], [place] [address]    Culture MULTIPLE SPECIES PRESENT, SUGGEST RECOLLECTION (A)  Final   Report Status 03/26/2020 FINAL  Final  SARS Coronavirus 2 by RT PCR (hospital order, performed in [facility] hospital lab) Nasopharyngeal Nasopharyngeal Swab     Status: None   Collection Time: 03/24/20  4:36 PM   Specimen: Nasopharyngeal Swab  Result Value Ref Range Status   SARS Coronavirus 2 NEGATIVE NEGATIVE Final    Comment: (NOTE) SARS-CoV-2 target nucleic acids are NOT DETECTED.  The SARS-CoV-2 RNA is generally detectable in upper and lower respiratory specimens during the acute phase of infection. The lowest concentration of SARS-CoV-2 viral copies this assay can detect is 250 copies / mL. A negative result does not preclude SARS-CoV-2 infection and should not be used as the sole basis for treatment or other patient management decisions.  A negative result may occur with improper specimen collection / handling, submission of specimen other than nasopharyngeal swab, presence of viral mutation(s) within  the areas targeted  by this assay, and inadequate number of viral copies (<250 copies / mL). A negative result must be combined with clinical observations, patient history, and epidemiological information.  Fact Sheet for Patients:   BoilerBrush.com.cy  Fact Sheet for Healthcare Providers: https://pope.com/  This test is not yet approved or  cleared by the [place] FDA and has been authorized for detection and/or diagnosis of SARS-CoV-2 by FDA under an Emergency Use Authorization (EUA).  This EUA will remain in effect (meaning this test can be used) for the duration of the COVID-19 declaration under Section 564(b)(1) of the Act, 21 U.S.C. section 360bbb-3(b)(1), unless the authorization is terminated or revoked sooner.  Performed at [facility], [address] [place], [place] [address]   Blood Culture (routine x 2)     Status: None (Preliminary result)   Collection Time: 03/25/20 12:53 AM   Specimen: BLOOD  Result Value Ref Range Status   Specimen Description BLOOD BLOOD RIGHT HAND  Final   Special Requests   Final    BOTTLES DRAWN AEROBIC AND ANAEROBIC Blood Culture adequate volume   Culture   Final    NO GROWTH 4 DAYS Performed at [facility], [address], [place], [place] [address]    Report Status PENDING  Incomplete  SARS Coronavirus 2 by RT PCR (hospital order, performed in [facility] hospital lab) Nasopharyngeal Nasopharyngeal Swab     Status: None   Collection Time: 03/28/20 12:40 PM   Specimen: Nasopharyngeal Swab  Result Value Ref Range Status   SARS Coronavirus 2 NEGATIVE NEGATIVE Final    Comment: (NOTE) SARS-CoV-2 target nucleic acids are NOT DETECTED.  The SARS-CoV-2 RNA is generally detectable in upper and lower respiratory specimens during the acute phase of infection. The lowest concentration of SARS-CoV-2 viral copies this assay can detect is 250 copies / mL. A negative result  does not preclude SARS-CoV-2 infection and should not be used as the sole basis for treatment or other patient management decisions.  A negative result may occur with improper specimen collection / handling, submission of specimen other than nasopharyngeal swab, presence of viral mutation(s) within the areas targeted by this assay, and inadequate number of viral copies (<250 copies / mL). A negative result must be combined with clinical observations, patient history, and epidemiological information.  Fact Sheet for Patients:   BoilerBrush.com.cy  Fact Sheet for Healthcare Providers: https://pope.com/  This test is not yet approved or  cleared by the [place] FDA and has been authorized for detection and/or diagnosis of SARS-CoV-2 by FDA under an Emergency Use Authorization (EUA).  This EUA will remain in effect (meaning this test can be used) for the duration of the COVID-19 declaration under Section 564(b)(1) of the Act, 21 U.S.C. section 360bbb-3(b)(1), unless the authorization is terminated or revoked sooner.  Performed at [facility], [address], [place], [place] [address]      Time coordinating discharge: Over 30 minutes  SIGNED:   Tresa Moore, MD  [facility] 03/29/2020, 11:06 AM Pager   If 7PM-7AM, please contact night-coverage

## 2020-03-29 NOTE — TOC Transition Note (Addendum)
Transition of Care [facility]) - CM/SW Discharge Note   Patient Details  Name: Javier Robinson MRN: [id number] Date of Birth: 1946-07-27  Transition of [facility] CM/SW Contact:  Lucy Chris, LCSW Phone Number: 03/29/2020, 11:09 AM   Clinical Narrative:  Pt is medically stable for transfer to [facility] and he has insurance approval. Ref [id number] 8/11-8/13 CM following Genelle Gather. Bedside RN to call report to [phone]. DC packet placed in chart along with DNR form. Contacted [name] to inform of plan to transfer this afternoon. Going to room 28A. Stacy Thurm-CM to follow. [phone]   Final next level of care: Skilled Nursing Facility Barriers to Discharge: Barriers Resolved   Patient Goals and CMS Choice Patient states their goals for this hospitalization and ongoing recovery are:: I don;t know you will need to talk with my niece-[name]. CMS Medicare.gov Compare Post Acute Care list provided to:: Patient Represenative (must comment) ([name]) Choice offered to / list presented to : Patient  Discharge Placement   Existing PASRR number confirmed : 03/27/20          Patient chooses bed at: [facility] Patient to be transferred to facility by: EMS Name of family member notified: [name] Patient and family notified of of transfer: 03/29/20  Discharge Plan and Services In-house Referral: Clinical Social Work   Post Acute Care Choice: Skilled Nursing Facility                               Social Determinants of Health (SDOH) Interventions     Readmission Risk Interventions Readmission Risk Prevention Plan 12/15/2019  Transportation Screening Complete  PCP or Specialist Appt within 3-5 Days Complete  HRI or Home Care Consult Complete  Social Work Consult for Recovery Care Planning/Counseling Complete  Palliative Care Screening Not Applicable  Medication Review Oceanographer) Complete  Some recent data might be hidden

## 2020-03-30 LAB — CULTURE, BLOOD (ROUTINE X 2)
Culture: NO GROWTH
Special Requests: ADEQUATE

## 2020-04-06 ENCOUNTER — Emergency Department: Payer: Medicare Other

## 2020-04-06 ENCOUNTER — Other Ambulatory Visit: Payer: Self-pay

## 2020-04-06 ENCOUNTER — Encounter: Payer: Self-pay | Admitting: Emergency Medicine

## 2020-04-06 ENCOUNTER — Inpatient Hospital Stay
Admission: EM | Admit: 2020-04-06 | Discharge: 2020-04-11 | DRG: 640 | Disposition: A | Discharge status: Skilled Nursing Facility | Payer: Medicare Other | Attending: Family Medicine | Admitting: Family Medicine

## 2020-04-06 DIAGNOSIS — F10288 Alcohol dependence with other alcohol-induced disorder: Secondary | ICD-10-CM

## 2020-04-06 DIAGNOSIS — L89300 Pressure ulcer of unspecified buttock, unstageable: Secondary | ICD-10-CM | POA: Diagnosis present

## 2020-04-06 DIAGNOSIS — E860 Dehydration: Secondary | ICD-10-CM | POA: Diagnosis present

## 2020-04-06 DIAGNOSIS — I10 Essential (primary) hypertension: Secondary | ICD-10-CM | POA: Diagnosis not present

## 2020-04-06 DIAGNOSIS — E162 Hypoglycemia, unspecified: Secondary | ICD-10-CM | POA: Diagnosis present

## 2020-04-06 DIAGNOSIS — E872 Acidosis, unspecified: Secondary | ICD-10-CM

## 2020-04-06 DIAGNOSIS — R1310 Dysphagia, unspecified: Secondary | ICD-10-CM | POA: Diagnosis present

## 2020-04-06 DIAGNOSIS — Z6821 Body mass index (BMI) 21.0-21.9, adult: Secondary | ICD-10-CM | POA: Diagnosis not present

## 2020-04-06 DIAGNOSIS — E878 Other disorders of electrolyte and fluid balance, not elsewhere classified: Secondary | ICD-10-CM | POA: Diagnosis present

## 2020-04-06 DIAGNOSIS — Z66 Do not resuscitate: Secondary | ICD-10-CM

## 2020-04-06 DIAGNOSIS — G9341 Metabolic encephalopathy: Secondary | ICD-10-CM | POA: Diagnosis present

## 2020-04-06 DIAGNOSIS — I509 Heart failure, unspecified: Secondary | ICD-10-CM | POA: Diagnosis present

## 2020-04-06 DIAGNOSIS — I110 Hypertensive heart disease with heart failure: Secondary | ICD-10-CM | POA: Diagnosis present

## 2020-04-06 DIAGNOSIS — R627 Adult failure to thrive: Secondary | ICD-10-CM

## 2020-04-06 DIAGNOSIS — Z20822 Contact with and (suspected) exposure to covid-19: Secondary | ICD-10-CM | POA: Diagnosis present

## 2020-04-06 DIAGNOSIS — E861 Hypovolemia: Secondary | ICD-10-CM | POA: Diagnosis present

## 2020-04-06 DIAGNOSIS — E871 Hypo-osmolality and hyponatremia: Principal | ICD-10-CM | POA: Diagnosis present

## 2020-04-06 DIAGNOSIS — D509 Iron deficiency anemia, unspecified: Secondary | ICD-10-CM | POA: Diagnosis present

## 2020-04-06 DIAGNOSIS — E876 Hypokalemia: Secondary | ICD-10-CM | POA: Diagnosis not present

## 2020-04-06 DIAGNOSIS — F1020 Alcohol dependence, uncomplicated: Secondary | ICD-10-CM | POA: Diagnosis present

## 2020-04-06 DIAGNOSIS — Z4659 Encounter for fitting and adjustment of other gastrointestinal appliance and device: Secondary | ICD-10-CM

## 2020-04-06 DIAGNOSIS — Z8673 Personal history of transient ischemic attack (TIA), and cerebral infarction without residual deficits: Secondary | ICD-10-CM

## 2020-04-06 DIAGNOSIS — Z515 Encounter for palliative care: Secondary | ICD-10-CM

## 2020-04-06 DIAGNOSIS — G8314 Monoplegia of lower limb affecting left nondominant side: Secondary | ICD-10-CM | POA: Diagnosis present

## 2020-04-06 DIAGNOSIS — F17210 Nicotine dependence, cigarettes, uncomplicated: Secondary | ICD-10-CM | POA: Diagnosis present

## 2020-04-06 DIAGNOSIS — K769 Liver disease, unspecified: Secondary | ICD-10-CM | POA: Diagnosis present

## 2020-04-06 DIAGNOSIS — E43 Unspecified severe protein-calorie malnutrition: Secondary | ICD-10-CM | POA: Diagnosis present

## 2020-04-06 DIAGNOSIS — E8729 Other acidosis: Secondary | ICD-10-CM | POA: Diagnosis present

## 2020-04-06 DIAGNOSIS — I959 Hypotension, unspecified: Secondary | ICD-10-CM | POA: Diagnosis not present

## 2020-04-06 LAB — BASIC METABOLIC PANEL
Anion gap: 10 (ref 5–15)
Anion gap: 7 (ref 5–15)
Anion gap: 7 (ref 5–15)
BUN: 6 mg/dL — ABNORMAL LOW (ref 8–23)
BUN: 6 mg/dL — ABNORMAL LOW (ref 8–23)
BUN: 5 mg/dL — ABNORMAL LOW (ref 8–23)
CO2: 20 mmol/L — ABNORMAL LOW (ref 22–32)
CO2: 19 mmol/L — ABNORMAL LOW (ref 22–32)
CO2: 20 mmol/L — ABNORMAL LOW (ref 22–32)
Calcium: 8.5 mg/dL — ABNORMAL LOW (ref 8.9–10.3)
Calcium: 7.8 mg/dL — ABNORMAL LOW (ref 8.9–10.3)
Calcium: 8 mg/dL — ABNORMAL LOW (ref 8.9–10.3)
Chloride: 79 mmol/L — ABNORMAL LOW (ref 98–111)
Chloride: 82 mmol/L — ABNORMAL LOW (ref 98–111)
Chloride: 81 mmol/L — ABNORMAL LOW (ref 98–111)
Creatinine, Ser: 0.4 mg/dL — ABNORMAL LOW (ref 0.61–1.24)
Creatinine, Ser: 0.33 mg/dL — ABNORMAL LOW (ref 0.61–1.24)
Creatinine, Ser: 0.38 mg/dL — ABNORMAL LOW (ref 0.61–1.24)
GFR calc Af Amer: >60 mL/min (ref >60)
GFR calc Af Amer: >60 mL/min (ref >60)
GFR calc Af Amer: >60 mL/min (ref >60)
GFR calc non Af Amer: >60 mL/min (ref >60)
GFR calc non Af Amer: >60 mL/min (ref >60)
GFR calc non Af Amer: >60 mL/min (ref >60)
Glucose, Bld: 74 mg/dL (ref 70–99)
Glucose, Bld: 120 mg/dL — ABNORMAL HIGH (ref 70–99)
Glucose, Bld: 101 mg/dL — ABNORMAL HIGH (ref 70–99)
Potassium: 4.6 mmol/L (ref 3.5–5.1)
Potassium: 4.4 mmol/L (ref 3.5–5.1)
Potassium: 4.3 mmol/L (ref 3.5–5.1)
Sodium: 109 mmol/L — CL (ref 135–145)
Sodium: 108 mmol/L — CL (ref 135–145)
Sodium: 108 mmol/L — CL (ref 135–145)

## 2020-04-06 LAB — CBC WITH DIFFERENTIAL/PLATELET
Abs Immature Granulocytes: 0.04 10*3/uL (ref 0.00–0.07)
Basophils Absolute: 0 10*3/uL (ref 0.0–0.1)
Basophils Relative: 0 %
Eosinophils Absolute: 0 10*3/uL (ref 0.0–0.5)
Eosinophils Relative: 0 %
HCT: 28.7 % — ABNORMAL LOW (ref 39.0–52.0)
Hemoglobin: 9.7 g/dL — ABNORMAL LOW (ref 13.0–17.0)
Immature Granulocytes: 1 %
Lymphocytes Relative: 12 %
Lymphs Abs: 1 10*3/uL (ref 0.7–4.0)
MCH: 26.6 pg (ref 26.0–34.0)
MCHC: 33.8 g/dL (ref 30.0–36.0)
MCV: 78.6 fL — ABNORMAL LOW (ref 80.0–100.0)
Monocytes Absolute: 0.5 10*3/uL (ref 0.1–1.0)
Monocytes Relative: 6 %
Neutro Abs: 6.5 10*3/uL (ref 1.7–7.7)
Neutrophils Relative %: 81 %
Platelets: 358 10*3/uL (ref 150–400)
RBC: 3.65 MIL/uL — ABNORMAL LOW (ref 4.22–5.81)
RDW: 16.7 % — ABNORMAL HIGH (ref 11.5–15.5)
Smear Review: NORMAL
WBC: 8.1 10*3/uL (ref 4.0–10.5)
nRBC: 0 % (ref 0.0–0.2)

## 2020-04-06 LAB — COMPREHENSIVE METABOLIC PANEL
ALT: 13 U/L (ref 0–44)
AST: 27 U/L (ref 15–41)
Albumin: 3 g/dL — ABNORMAL LOW (ref 3.5–5.0)
Alkaline Phosphatase: 75 U/L (ref 38–126)
Anion gap: 14 (ref 5–15)
BUN: 6 mg/dL — ABNORMAL LOW (ref 8–23)
CO2: 17 mmol/L — ABNORMAL LOW (ref 22–32)
Calcium: 8.6 mg/dL — ABNORMAL LOW (ref 8.9–10.3)
Chloride: 79 mmol/L — ABNORMAL LOW (ref 98–111)
Creatinine, Ser: 0.34 mg/dL — ABNORMAL LOW (ref 0.61–1.24)
GFR calc Af Amer: >60 mL/min (ref >60)
GFR calc non Af Amer: >60 mL/min (ref >60)
Glucose, Bld: 49 mg/dL — ABNORMAL LOW (ref 70–99)
Potassium: 4.1 mmol/L (ref 3.5–5.1)
Sodium: 110 mmol/L — CL (ref 135–145)
Total Bilirubin: 0.7 mg/dL (ref 0.3–1.2)
Total Protein: 7.9 g/dL (ref 6.5–8.1)

## 2020-04-06 LAB — URINALYSIS, COMPLETE (UACMP) WITH MICROSCOPIC
Bacteria, UA: NONE SEEN
Bilirubin Urine: NEGATIVE
Glucose, UA: NEGATIVE mg/dL
Hgb urine dipstick: NEGATIVE
Ketones, ur: NEGATIVE mg/dL
Leukocytes,Ua: NEGATIVE
Nitrite: NEGATIVE
Protein, ur: NEGATIVE mg/dL
Specific Gravity, Urine: 1.019 (ref 1.005–1.030)
pH: 7 (ref 5.0–8.0)

## 2020-04-06 LAB — NA AND K (SODIUM & POTASSIUM), RAND UR
Potassium Urine: 65 mmol/L
Sodium, Ur: 130 mmol/L

## 2020-04-06 LAB — GLUCOSE, CAPILLARY
Glucose-Capillary: 85 mg/dL (ref 70–99)
Glucose-Capillary: 66 mg/dL — ABNORMAL LOW (ref 70–99)
Glucose-Capillary: 124 mg/dL — ABNORMAL HIGH (ref 70–99)

## 2020-04-06 LAB — SARS CORONAVIRUS 2 BY RT PCR (HOSPITAL ORDER, PERFORMED IN ~~LOC~~ HOSPITAL LAB): SARS Coronavirus 2: NEGATIVE

## 2020-04-06 LAB — CREATININE, URINE, RANDOM: Creatinine, Urine: 128 mg/dL

## 2020-04-06 MED ORDER — ADULT MULTIVITAMIN W/MINERALS CH: 1.0000 | ORAL_TABLET | Freq: Every day | ORAL | Status: DC

## 2020-04-06 MED ORDER — SODIUM CHLORIDE 0.9 % IV SOLN: Freq: Once | INTRAVENOUS | Status: AC

## 2020-04-06 MED ORDER — ENOXAPARIN SODIUM 40 MG/0.4ML ~~LOC~~ SOLN
40.0000 mg | SUBCUTANEOUS | Status: DC
  Administered 2020-04-07 – 2020-04-10 (×4): 40 mg via SUBCUTANEOUS
  Filled 2020-04-06 (×4): qty 0.4

## 2020-04-06 MED ORDER — THIAMINE HCL 100 MG PO TABS: 100.0000 mg | ORAL_TABLET | Freq: Every day | ORAL | Status: DC

## 2020-04-06 MED ORDER — NEPRO PO LIQD
1.0000 | Freq: Three times a day (TID) | ORAL | Status: DC
  Filled 2020-04-06 (×7): qty 237

## 2020-04-06 MED ORDER — DEXTROSE 50 % IV SOLN: 1.0000 | Freq: Once | INTRAVENOUS | Status: AC

## 2020-04-06 MED ORDER — FERROUS SULFATE 325 (65 FE) MG PO TABS
325.0000 mg | ORAL_TABLET | Freq: Three times a day (TID) | ORAL | Status: DC
  Filled 2020-04-06 (×7): qty 1

## 2020-04-06 MED ORDER — DEXTROSE 50 % IV SOLN
INTRAVENOUS | Status: AC
  Administered 2020-04-06: 50 mL via INTRAVENOUS
  Filled 2020-04-06: qty 50

## 2020-04-06 MED ORDER — FOLIC ACID 1 MG PO TABS: 1.0000 mg | ORAL_TABLET | Freq: Every day | ORAL | Status: DC

## 2020-04-06 MED ORDER — TAMSULOSIN HCL 0.4 MG PO CAPS
0.4000 mg | ORAL_CAPSULE | Freq: Every day | ORAL | Status: DC
  Administered 2020-04-09: 0.4 mg via ORAL
  Filled 2020-04-06: qty 1

## 2020-04-06 MED ORDER — DEXTROSE 50 % IV SOLN: 1.0000 | Freq: Once | INTRAVENOUS | Status: DC

## 2020-04-06 MED ORDER — DEXTROSE 50 % IV SOLN
1.0000 | Freq: Once | INTRAVENOUS | Status: AC
  Administered 2020-04-06: 50 mL via INTRAVENOUS
  Filled 2020-04-06: qty 50
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
## 2020-04-06 NOTE — ED Triage Notes (Signed)
Pt presents to ED via [facility] with c/o dysphagia, pt evaluated today by SLP at [facility] and wanted to make patient NPO, however was unable to make patient NPO without sending patient out for further evaluation. Per EMS pt with hx of stroke, dementia, pt has been on pureed and crushed pills in apple sauce prior to today, unable to state if patient ate breakfast today.   Pt noted to be non-verbal on arrival to ED.

## 2020-04-07 LAB — BASIC METABOLIC PANEL
Anion gap: 8 (ref 5–15)
Anion gap: 8 (ref 5–15)
Anion gap: 8 (ref 5–15)
Anion gap: 7 (ref 5–15)
Anion gap: 7 (ref 5–15)
Anion gap: 9 (ref 5–15)
Anion gap: 9 (ref 5–15)
Anion gap: 10 (ref 5–15)
Anion gap: 8 (ref 5–15)
Anion gap: 9 (ref 5–15)
Anion gap: 10 (ref 5–15)
BUN: <5 mg/dL — ABNORMAL LOW (ref 8–23)
BUN: 5 mg/dL — ABNORMAL LOW (ref 8–23)
BUN: <5 mg/dL — ABNORMAL LOW (ref 8–23)
BUN: <5 mg/dL — ABNORMAL LOW (ref 8–23)
BUN: <5 mg/dL — ABNORMAL LOW (ref 8–23)
BUN: 5 mg/dL — ABNORMAL LOW (ref 8–23)
BUN: <5 mg/dL — ABNORMAL LOW (ref 8–23)
BUN: <5 mg/dL — ABNORMAL LOW (ref 8–23)
BUN: <5 mg/dL — ABNORMAL LOW (ref 8–23)
BUN: <5 mg/dL — ABNORMAL LOW (ref 8–23)
BUN: <5 mg/dL — ABNORMAL LOW (ref 8–23)
CO2: 19 mmol/L — ABNORMAL LOW (ref 22–32)
CO2: 20 mmol/L — ABNORMAL LOW (ref 22–32)
CO2: 19 mmol/L — ABNORMAL LOW (ref 22–32)
CO2: 19 mmol/L — ABNORMAL LOW (ref 22–32)
CO2: 19 mmol/L — ABNORMAL LOW (ref 22–32)
CO2: 18 mmol/L — ABNORMAL LOW (ref 22–32)
CO2: 16 mmol/L — ABNORMAL LOW (ref 22–32)
CO2: 17 mmol/L — ABNORMAL LOW (ref 22–32)
CO2: 17 mmol/L — ABNORMAL LOW (ref 22–32)
CO2: 17 mmol/L — ABNORMAL LOW (ref 22–32)
CO2: 18 mmol/L — ABNORMAL LOW (ref 22–32)
Calcium: 7.9 mg/dL — ABNORMAL LOW (ref 8.9–10.3)
Calcium: 7.9 mg/dL — ABNORMAL LOW (ref 8.9–10.3)
Calcium: 7.9 mg/dL — ABNORMAL LOW (ref 8.9–10.3)
Calcium: 7.8 mg/dL — ABNORMAL LOW (ref 8.9–10.3)
Calcium: 7.6 mg/dL — ABNORMAL LOW (ref 8.9–10.3)
Calcium: 8 mg/dL — ABNORMAL LOW (ref 8.9–10.3)
Calcium: 7.4 mg/dL — ABNORMAL LOW (ref 8.9–10.3)
Calcium: 8 mg/dL — ABNORMAL LOW (ref 8.9–10.3)
Calcium: 7.9 mg/dL — ABNORMAL LOW (ref 8.9–10.3)
Calcium: 7.8 mg/dL — ABNORMAL LOW (ref 8.9–10.3)
Calcium: 7.8 mg/dL — ABNORMAL LOW (ref 8.9–10.3)
Chloride: 82 mmol/L — ABNORMAL LOW (ref 98–111)
Chloride: 81 mmol/L — ABNORMAL LOW (ref 98–111)
Chloride: 85 mmol/L — ABNORMAL LOW (ref 98–111)
Chloride: 86 mmol/L — ABNORMAL LOW (ref 98–111)
Chloride: 88 mmol/L — ABNORMAL LOW (ref 98–111)
Chloride: 85 mmol/L — ABNORMAL LOW (ref 98–111)
Chloride: 90 mmol/L — ABNORMAL LOW (ref 98–111)
Chloride: 86 mmol/L — ABNORMAL LOW (ref 98–111)
Chloride: 88 mmol/L — ABNORMAL LOW (ref 98–111)
Chloride: 89 mmol/L — ABNORMAL LOW (ref 98–111)
Chloride: 89 mmol/L — ABNORMAL LOW (ref 98–111)
Creatinine, Ser: 0.42 mg/dL — ABNORMAL LOW (ref 0.61–1.24)
Creatinine, Ser: 0.38 mg/dL — ABNORMAL LOW (ref 0.61–1.24)
Creatinine, Ser: 0.38 mg/dL — ABNORMAL LOW (ref 0.61–1.24)
Creatinine, Ser: 0.32 mg/dL — ABNORMAL LOW (ref 0.61–1.24)
Creatinine, Ser: 0.36 mg/dL — ABNORMAL LOW (ref 0.61–1.24)
Creatinine, Ser: 0.45 mg/dL — ABNORMAL LOW (ref 0.61–1.24)
Creatinine, Ser: 0.34 mg/dL — ABNORMAL LOW (ref 0.61–1.24)
Creatinine, Ser: 0.33 mg/dL — ABNORMAL LOW (ref 0.61–1.24)
Creatinine, Ser: 0.34 mg/dL — ABNORMAL LOW (ref 0.61–1.24)
Creatinine, Ser: 0.38 mg/dL — ABNORMAL LOW (ref 0.61–1.24)
Creatinine, Ser: 0.31 mg/dL — ABNORMAL LOW (ref 0.61–1.24)
GFR calc Af Amer: >60 mL/min (ref >60)
GFR calc Af Amer: >60 mL/min (ref >60)
GFR calc Af Amer: >60 mL/min (ref >60)
GFR calc Af Amer: >60 mL/min (ref >60)
GFR calc Af Amer: >60 mL/min (ref >60)
GFR calc Af Amer: >60 mL/min (ref >60)
GFR calc Af Amer: >60 mL/min (ref >60)
GFR calc Af Amer: >60 mL/min (ref >60)
GFR calc Af Amer: >60 mL/min (ref >60)
GFR calc Af Amer: >60 mL/min (ref >60)
GFR calc Af Amer: >60 mL/min (ref >60)
GFR calc non Af Amer: >60 mL/min (ref >60)
GFR calc non Af Amer: >60 mL/min (ref >60)
GFR calc non Af Amer: >60 mL/min (ref >60)
GFR calc non Af Amer: >60 mL/min (ref >60)
GFR calc non Af Amer: >60 mL/min (ref >60)
GFR calc non Af Amer: >60 mL/min (ref >60)
GFR calc non Af Amer: >60 mL/min (ref >60)
GFR calc non Af Amer: >60 mL/min (ref >60)
GFR calc non Af Amer: >60 mL/min (ref >60)
GFR calc non Af Amer: >60 mL/min (ref >60)
GFR calc non Af Amer: >60 mL/min (ref >60)
Glucose, Bld: 85 mg/dL (ref 70–99)
Glucose, Bld: 82 mg/dL (ref 70–99)
Glucose, Bld: 72 mg/dL (ref 70–99)
Glucose, Bld: 71 mg/dL (ref 70–99)
Glucose, Bld: 64 mg/dL — ABNORMAL LOW (ref 70–99)
Glucose, Bld: 63 mg/dL — ABNORMAL LOW (ref 70–99)
Glucose, Bld: 52 mg/dL — ABNORMAL LOW (ref 70–99)
Glucose, Bld: 55 mg/dL — ABNORMAL LOW (ref 70–99)
Glucose, Bld: 87 mg/dL (ref 70–99)
Glucose, Bld: 91 mg/dL (ref 70–99)
Glucose, Bld: 157 mg/dL — ABNORMAL HIGH (ref 70–99)
Potassium: 4.4 mmol/L (ref 3.5–5.1)
Potassium: 4.2 mmol/L (ref 3.5–5.1)
Potassium: 4.1 mmol/L (ref 3.5–5.1)
Potassium: 3.9 mmol/L (ref 3.5–5.1)
Potassium: 3.8 mmol/L (ref 3.5–5.1)
Potassium: 3.8 mmol/L (ref 3.5–5.1)
Potassium: 3.5 mmol/L (ref 3.5–5.1)
Potassium: 3.8 mmol/L (ref 3.5–5.1)
Potassium: 4 mmol/L (ref 3.5–5.1)
Potassium: 3.8 mmol/L (ref 3.5–5.1)
Potassium: 3.5 mmol/L (ref 3.5–5.1)
Sodium: 109 mmol/L — CL (ref 135–145)
Sodium: 109 mmol/L — CL (ref 135–145)
Sodium: 112 mmol/L — CL (ref 135–145)
Sodium: 112 mmol/L — CL (ref 135–145)
Sodium: 114 mmol/L — CL (ref 135–145)
Sodium: 112 mmol/L — CL (ref 135–145)
Sodium: 115 mmol/L — CL (ref 135–145)
Sodium: 113 mmol/L — CL (ref 135–145)
Sodium: 113 mmol/L — CL (ref 135–145)
Sodium: 115 mmol/L — CL (ref 135–145)
Sodium: 117 mmol/L — CL (ref 135–145)

## 2020-04-07 LAB — CBC
HCT: 24.3 % — ABNORMAL LOW (ref 39.0–52.0)
Hemoglobin: 8.8 g/dL — ABNORMAL LOW (ref 13.0–17.0)
MCH: 27.4 pg (ref 26.0–34.0)
MCHC: 36.2 g/dL — ABNORMAL HIGH (ref 30.0–36.0)
MCV: 75.7 fL — ABNORMAL LOW (ref 80.0–100.0)
Platelets: 359 10*3/uL (ref 150–400)
RBC: 3.21 MIL/uL — ABNORMAL LOW (ref 4.22–5.81)
RDW: 16.7 % — ABNORMAL HIGH (ref 11.5–15.5)
WBC: 11.4 10*3/uL — ABNORMAL HIGH (ref 4.0–10.5)
nRBC: 0 % (ref 0.0–0.2)

## 2020-04-07 LAB — OSMOLALITY: Osmolality: 226 mOsm/kg — CL (ref 275–295)

## 2020-04-07 LAB — GLUCOSE, CAPILLARY: Glucose-Capillary: 81 mg/dL (ref 70–99)

## 2020-04-07 LAB — CORTISOL: Cortisol, Plasma: 18.1 ug/dL

## 2020-04-07 LAB — OSMOLALITY, URINE: Osmolality, Ur: 632 mOsm/kg (ref 300–900)

## 2020-04-07 MED ORDER — SODIUM CHLORIDE 3 % IV SOLN
INTRAVENOUS | Status: DC
  Filled 2020-04-07: qty 500

## 2020-04-07 MED ORDER — SODIUM CHLORIDE 3 % IV SOLN
INTRAVENOUS | Status: DC
  Filled 2020-04-07 (×2): qty 500

## 2020-04-07 MED ORDER — DEXTROSE-NACL 5-0.9 % IV SOLN: INTRAVENOUS | Status: DC

## 2020-04-07 NOTE — Progress Notes (Signed)
PHARMACY CONSULT NOTE  Pharmacy Consult for Electrolyte Monitoring and Replacement   Recent Labs: Potassium (mmol/L)  Date Value  04/07/2020 3.8   Magnesium (mg/dL)  Date Value  76/73/4193 1.9   Calcium (mg/dL)  Date Value  79/09/4095 7.8 (L)   Albumin (g/dL)  Date Value  35/32/9924 3.0 (L)   Phosphorus (mg/dL)  Date Value  26/83/4196 2.5   Sodium (mmol/L)  Date Value  04/07/2020 115 (LL)   Assessment:  74 y.o. male with PMH of chronic alcohol use, HTN, recent admission for sepsis and malnutrition who presents from ALF apparently for difficulty swallowing. Patient is responsive on exam but non-verbal; apparently mental status is at baseline per report. Per report from facility  Na 108, pt started on 3% NaCl at 21ml/hr 0820 0154 Na 109, continue current plan  0820 0402 Na 112, continue current plan 0820 0624 Na 114, 3% NaCl decreased to 20 ml/hr 0820 0810 Na 112, continue current plan 0820 1008 Na 115, continue current plan 0820 1130 MD added D5NS at 125 mL/hr 0820 1254 Na 113, continue current plan 0820 1454 Na 113, continue current plan 0820 1711 Na 115, continue current rate  Goal of Therapy:  Recommended rate of correction = no greater than 0.5 mmol/L/hr and no greater than 12 mmol/L/day  Plan:  --continue 3% NaCl at 20 mL/hr + D5NS at 125 mL/hr --Sodium checks currently ordered q4h while on hypertonic saline  Fadi Menter A 04/07/2020 7:34 PM

## 2020-04-07 NOTE — Progress Notes (Signed)
    BRIEF OVERNIGHT PROGRESS REPORT   Patient with continued worsening hyponatremia despite correction with NS. Sodium levels dropped to 108 from previous 110. Mental status remains unchanged.  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       Webb Silversmith, BSN, MSN, DNP, [employer]  [facility] Nurse Practitioner  [facility]

## 2020-04-07 NOTE — Progress Notes (Signed)
PHARMACY CONSULT NOTE  Pharmacy Consult for Electrolyte Monitoring and Replacement   Recent Labs: Potassium (mmol/L)  Date Value  04/07/2020 4.0   Magnesium (mg/dL)  Date Value  54/49/2010 1.9   Calcium (mg/dL)  Date Value  02/27/1974 7.9 (L)   Albumin (g/dL)  Date Value  88/32/5498 3.0 (L)   Phosphorus (mg/dL)  Date Value  26/41/5830 2.5   Sodium (mmol/L)  Date Value  04/07/2020 113 (LL)   Assessment:  74 y.o. male with PMH of chronic alcohol use, HTN, recent admission for sepsis and malnutrition who presents from ALF apparently for difficulty swallowing. Patient is responsive on exam but non-verbal; apparently mental status is at baseline per report. Per report from facility  Na 108, pt started on 3% NaCl at 26ml/hr 0820 0154 Na 109, continue current plan  0820 0402 Na 112, continue current plan 0820 0624 Na 114, 3% NaCl decreased to 20 ml/hr 0820 0810 Na 112, continue current plan 0820 1008 Na 115, continue current plan 0820 1130 MD added D5NS at 125 mL/hr 0820 1254 Na 113, continue current plan 0820 1454 Na 113, continue current plan  Goal of Therapy:  Recommended rate of correction = no greater than 0.5 mmol/L/hr and no greater than 12 mmol/L/day  Plan:  --continue 3% NaCl at 20 mL/hr + D5NS at 125 mL/hr --Sodium checks currently ordered q4h while on hypertonic saline  Javier Robinson 04/07/2020 4:51 PM

## 2020-04-07 NOTE — Progress Notes (Signed)
PHARMACY CONSULT NOTE  Pharmacy Consult for Electrolyte Monitoring and Replacement   Recent Labs: Potassium (mmol/L)  Date Value  04/07/2020 3.8   Magnesium (mg/dL)  Date Value  19/37/9024 1.9   Calcium (mg/dL)  Date Value  09/73/5329 7.6 (L)   Albumin (g/dL)  Date Value  92/42/6834 3.0 (L)   Phosphorus (mg/dL)  Date Value  19/62/2297 2.5   Sodium (mmol/L)  Date Value  04/07/2020 114 (LL)   Assessment:  74 y.o. male with PMH of chronic alcohol use, HTN, recent admission for sepsis and malnutrition who presents from ALF apparently for difficulty swallowing. Patient is responsive on exam but non-verbal; apparently mental status is at baseline per report. Per report from facility  Na 108, pt started on 3% NaCl at 7ml/hr 0820 0154 Na 109, continue current plan  0820 0402 Na 112, continue current plan  Goal of Therapy:  Recommended rate of correction = no greater than 0.5 mmol/L/hr and no greater than 12 mmol/L/day  Plan:  --9892 1194 Na 114, 3% saline started at rate of 30 mL/min at 0114 per MAR. Serum sodium has increased +5 mmol/L in ~5 hours = +72mmol/L/hr  --Discussed with MD, will decrease rate to 20 mL/hr   --Sodium checks currently ordered q4h while on hypertonic saline  Tressie Ellis 04/07/2020 7:30 AM

## 2020-04-07 NOTE — Progress Notes (Signed)
PHARMACY CONSULT NOTE  Pharmacy Consult for Electrolyte Monitoring and Replacement   Recent Labs: Potassium (mmol/L)  Date Value  04/07/2020 3.5   Magnesium (mg/dL)  Date Value  53/29/9242 1.9   Calcium (mg/dL)  Date Value  68/34/1962 7.4 (L)   Albumin (g/dL)  Date Value  22/97/9892 3.0 (L)   Phosphorus (mg/dL)  Date Value  11/94/1740 2.5   Sodium (mmol/L)  Date Value  04/07/2020 115 (LL)   Assessment:  74 y.o. male with PMH of chronic alcohol use, HTN, recent admission for sepsis and malnutrition who presents from ALF apparently for difficulty swallowing. Patient is responsive on exam but non-verbal; apparently mental status is at baseline per report. Per report from facility  Na 108, pt started on 3% NaCl at 38ml/hr 0820 0154 Na 109, continue current plan  0820 0402 Na 112, continue current plan 0820 0624 Na 114, 3% NaCl decreased to 20 ml/hr 0820 0810 Na 112, continue current plan 0820 1008 Na 115, continue current plan  Goal of Therapy:  Recommended rate of correction = no greater than 0.5 mmol/L/hr and no greater than 12 mmol/L/day  Plan:  --0820 1008 Na 115, continue 3% NaCl at 20 mL/hr --Sodium checks currently ordered q2h while on hypertonic saline  Tressie Ellis 04/07/2020 11:22 AM

## 2020-04-07 NOTE — ED Notes (Signed)
Bladder scan reveals in pt bladder, MD made aware

## 2020-04-07 NOTE — Progress Notes (Signed)
PHARMACY CONSULT NOTE - FOLLOW UP  Pharmacy Consult for Electrolyte Monitoring and Replacement   Recent Labs: Potassium (mmol/L)  Date Value  04/06/2020 4.3   Magnesium (mg/dL)  Date Value  78/58/8502 1.9   Calcium (mg/dL)  Date Value  77/41/2878 8.0 (L)   Albumin (g/dL)  Date Value  67/67/2094 3.0 (L)   Phosphorus (mg/dL)  Date Value  70/96/2836 2.5   Sodium (mmol/L)  Date Value  04/06/2020 108 (LL)   Assessment:  74 y.o. male with PMH of chronic alcohol use, HTN, recent admission for sepsis and malnutrition who presents from ALF apparently for difficulty swallowing. Patient is responsive on exam but non-verbal; apparently mental status is at baseline per report. Per report from facility  Na 108, pt started on 3% NaCl at 60ml/hr  Goal of Therapy:  Lytes WNL  Plan:  Monitor Labs & Na and adjust per protocol  Wayland Denis ,PharmD Clinical Pharmacist 04/07/2020 12:47 AM

## 2020-04-07 NOTE — Progress Notes (Signed)
PHARMACY CONSULT NOTE  Pharmacy Consult for Electrolyte Monitoring and Replacement   Recent Labs: Potassium (mmol/L)  Date Value  04/07/2020 3.5   Magnesium (mg/dL)  Date Value  55/97/4163 1.9   Calcium (mg/dL)  Date Value  84/53/6468 7.8 (L)   Albumin (g/dL)  Date Value  11/07/2246 3.0 (L)   Phosphorus (mg/dL)  Date Value  25/00/3704 2.5   Sodium (mmol/L)  Date Value  04/07/2020 117 (LL)   Assessment:  74 y.o. male with PMH of chronic alcohol use, HTN, recent admission for sepsis and malnutrition who presents from ALF apparently for difficulty swallowing. Patient is responsive on exam but non-verbal; apparently mental status is at baseline per report. Per report from facility  Na 108, pt started on 3% NaCl at 80ml/hr 0820 0154 Na 109, continue current plan  0820 0402 Na 112, continue current plan 0820 0624 Na 114, 3% NaCl decreased to 20 ml/hr 0820 0810 Na 112, continue current plan 0820 1008 Na 115, continue current plan 0820 1130 MD added D5NS at 125 mL/hr 0820 1254 Na 113, continue current plan 0820 1454 Na 113, continue current plan 0820 1711 Na 115, continue current rate 0820 2059 Na 117, continue current rate  Goal of Therapy:  Recommended rate of correction = no greater than 0.5 mmol/L/hr and no greater than 12 mmol/L/day  Plan:  --continue 3% NaCl at 20 mL/hr + D5NS at 125 mL/hr --Sodium checks currently ordered q4h while on hypertonic saline  Valrie Hart A 04/07/2020 10:00 PM

## 2020-04-07 NOTE — Progress Notes (Signed)
PHARMACY CONSULT NOTE  Pharmacy Consult for Electrolyte Monitoring and Replacement   Recent Labs: Potassium (mmol/L)  Date Value  04/07/2020 3.8   Magnesium (mg/dL)  Date Value  00/37/0488 1.9   Calcium (mg/dL)  Date Value  89/16/9450 8.0 (L)   Albumin (g/dL)  Date Value  38/88/2800 3.0 (L)   Phosphorus (mg/dL)  Date Value  34/91/7915 2.5   Sodium (mmol/L)  Date Value  04/07/2020 112 (LL)   Assessment:  74 y.o. male with PMH of chronic alcohol use, HTN, recent admission for sepsis and malnutrition who presents from ALF apparently for difficulty swallowing. Patient is responsive on exam but non-verbal; apparently mental status is at baseline per report. Per report from facility  Na 108, pt started on 3% NaCl at 70ml/hr 0820 0154 Na 109, continue current plan  0820 0402 Na 112, continue current plan 0820 0624 Na 114, 3% NaCl decreased to 20 ml/hr 0820 0810 Na 112, continue current plan  Goal of Therapy:  Recommended rate of correction = no greater than 0.5 mmol/L/hr and no greater than 12 mmol/L/day  Plan:  --0820 0810 Na 112, continue 3% NaCl at 20 mL/hr --Sodium checks currently ordered q2h while on hypertonic saline  Tressie Ellis 04/07/2020 10:39 AM
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
## 2020-04-07 NOTE — Progress Notes (Addendum)
PROGRESS NOTE    Patient: Javier Robinson                            PCP: Charlott Rakes, MD                    DOB: [birth year]            DOA: 04/06/2020 AVW:[id number]             DOS: 04/07/2020, 10:56 AM   LOS: 1 day   Date of Service: The patient was seen and examined on 04/07/2020  Subjective:   The patient was seen and examined this morning, easily arousable, verbal conversation minimal.  Able to move upper extremities, not moving right lower extremity stating he had a stroke. Hemodynamically stable Questionable baseline mentation   Brief Narrative:   HPI: Javier Robinson 74 yo M with PMHx  chronic alcohol use, HTN, recent admission for sepsis and malnutrition who presents from ALF apparently for difficulty swallowing,  generalized weakness, confusion, difficulty . Reviewed records, pt was just hospitalized for sepsis, d/c to SNF. No apparent focal deficits on exam. CT Head shows NAICA. CXR w/o PNA or apparent major aspiration.  Apparently mental status is at baseline per report. Per report from facility [facility])  Labs as above. BMP with profound hyponatremia ( Na 110) , acidemia, hypoglycemia. Suspect related to poor PO intake. NS at 100.   Assessment & Plan:   Active Problems:   Hypoglycemia   Hyponatremia   Alcohol dependence (HCC)   Essential hypertension   Protein-calorie malnutrition, severe   Microcytic anemia  Hyponatremia and Hypochloremia with Non Anion Gap Metabolic Acidosis  -Multifactorial including poor p.o. intake, dehydration -Sodium on admission 108, 109, 112, 114, 112, 115 now -Patient was started on hypertonic normal saline overnight--- continue titrating for slow correction Nephrology consulted   On admission Na noted to be profoundly low at 110. Cl  79. Bicarb 17. Patient appears hypovolemic on admission. Does appear that he sometimes has chronic mild hyponatremia in the low 130s, likely compounded by alcohol use. Not on thiazide diuretic  or other offending agents. CT head was negative. Appears to be at baseline for mental status.   -urine electrolytes and osmolality studies pending    Dehydration/hypoglycemia  On admission glucose 49 at presentation. Suspect related to poor PO intake. Given D50 in ED.  -Starting D5 normal saline fluid resuscitation -Checking CBG every 4 hours  HTN  Stable blood pressure -Monitoring --Given poor PO intake, will hold medications.  -hold Amlodipine   Microcytic Anemia  HgB is 9.7, >>> 8.8  -improved from recent hospitalization with value of 7.5. MCV low.  -continue Fe supplement   Alcohol Dependence  Unclear if he still using alcohol-  has been at ALF.  -monitor closely, start CIWA as needed  -continue MVI, thiamine, folic acid   Protein-Calorie Malnutrition  -consult nutritionist      Nutritional status:         Consultants: Nephrology PCCM   ------------------------------------------------------------------------------------------------------------------------------------------------  DVT prophylaxis:  SCD/Compression stockings and Lovenox SQ Code Status:   Code Status: DNR Family Communication: No family member present at bedside- attempt will be made to update daily The above findings and plan of care has been discussed with patient --- poor insight.   Admission status:    Status is: Inpatient  Remains inpatient appropriate because:Inpatient level of care appropriate due to severity of illness  Dispo: The patient is from: SNF              Anticipated d/c is to: SNF              Anticipated d/c date is: 3 days              Patient currently is not medically stable to d/c.        Procedures:   No admission procedures for hospital encounter.     Antimicrobials:  Anti-infectives (From admission, onward)   None       Medication:  . enoxaparin (LOVENOX) injection  40 mg Subcutaneous Q24H  . ferrous sulfate  325 mg Oral TID WC  .  folic acid  1 mg Oral Daily  . multivitamin with minerals  1 tablet Oral Daily  . Nepro  1 Bottle Oral TID WC  . tamsulosin  0.4 mg Oral QPC supper  . thiamine  100 mg Oral Daily       Objective:   Vitals:   04/07/20 0500 04/07/20 0530 04/07/20 0630 04/07/20 1016  BP: 117/60 (!) 123/53 96/70 123/75  Pulse: 84 84 86 80  Resp:  13 18 18   Temp:      TempSrc:      SpO2: 98% 96% 99% 100%  Weight:      Height:       No intake or output data in the 24 hours ending 04/07/20 1056 Filed Weights   04/06/20 1456  Weight: 63.5 kg     Examination:   Physical Exam  Constitution: Easily arousable, few words, appears calm and comfortable    HEENT: Normocephalic, PERRL, otherwise with in Normal limits  Chest:Chest symmetric Cardio vascular:  S1/S2, RRR, No murmure, No Rubs or Gallops  pulmonary: Clear to auscultation bilaterally, respirations unlabored, negative wheezes / crackles Abdomen: Soft, non-tender, non-distended, bowel sounds,no masses, no organomegaly Muscular skeletal: Dense right lower extremity weakness  Limited exam - in bed, able to move all 3 extremities, Normal strength,  Neuro: CNII-XII intact. , normal motor and sensation, reflexes intact  Extremities: No pitting edema lower extremities, +2 pulses  Skin: Dry, warm to touch, negative for any Rashes, No open wounds Wounds: per nursing documentation Pressure Injury 11/14/19 Hip Right Stage 2 -  Partial thickness loss of dermis presenting as a shallow open injury with a red, pink wound bed without slough. open wound (Active)  11/14/19 1110  Location: Hip  Location Orientation: Right  Staging: Stage 2 -  Partial thickness loss of dermis presenting as a shallow open injury with a red, pink wound bed without slough.  Wound Description (Comments): open wound  Present on Admission:      Pressure Injury 11/14/19 Thigh Posterior;Proximal Stage 1 -  Intact skin with non-blanchable redness of a localized area usually over a  bony prominence. (Active)  11/14/19 1114  Location: Thigh  Location Orientation: Posterior;Proximal  Staging: Stage 1 -  Intact skin with non-blanchable redness of a localized area usually over a bony prominence.  Wound Description (Comments):   Present on Admission:      Pressure Injury 03/24/20 Buttocks Right;Left;Mid Unstageable - Full thickness tissue loss in which the base of the injury is covered by slough (yellow, tan, Reinhold, green or brown) and/or eschar (tan, brown or black) in the wound bed. Pt has two pressure i (Active)  03/24/20 1200  Location: Buttocks  Location Orientation: Right;Left;Mid  Staging: Unstageable - Full thickness tissue loss in which the base of the  injury is covered by slough (yellow, tan, Semmel, green or brown) and/or eschar (tan, brown or black) in the wound bed.  Wound Description (Comments): Pt has two pressure injuries on sacrum. Right sided pressure injury is unstageable   Present on Admission: Yes     Pressure Injury 03/24/20 Sacrum Medial Stage 2 -  Partial thickness loss of dermis presenting as a shallow open injury with a red, pink wound bed without slough. (Active)  03/24/20 2230  Location: Sacrum  Location Orientation: Medial  Staging: Stage 2 -  Partial thickness loss of dermis presenting as a shallow open injury with a red, pink wound bed without slough.  Wound Description (Comments):   Present on Admission: Yes     Pressure Injury 03/24/20 Buttocks Right Stage 2 -  Partial thickness loss of dermis presenting as a shallow open injury with a red, pink wound bed without slough. (Active)  03/24/20 2230  Location: Buttocks  Location Orientation: Right  Staging: Stage 2 -  Partial thickness loss of dermis presenting as a shallow open injury with a red, pink wound bed without slough.  Wound Description (Comments):   Present on Admission: Yes     Pressure Injury 03/24/20 Hip Right Stage 2 -  Partial thickness loss of dermis presenting as a shallow  open injury with a red, pink wound bed without slough. (Active)  03/24/20 2230  Location: Hip  Location Orientation: Right  Staging: Stage 2 -  Partial thickness loss of dermis presenting as a shallow open injury with a red, pink wound bed without slough.  Wound Description (Comments):   Present on Admission: Yes    ------------------------------------------------------------------------------------------------------------------------------------------    LABs:  CBC Latest Ref Rng & Units 04/07/2020 04/06/2020 04/06/2020  WBC 4.0 - 10.5 K/uL 11.4(H) 10.6(H) 8.1  Hemoglobin 13.0 - 17.0 g/dL 6.2(B) 8.9(L) 9.7(L)  Hematocrit 39 - 52 % 24.3(L) 25.6(L) 28.7(L)  Platelets 150 - 400 K/uL 359 351 358   CMP Latest Ref Rng & Units 04/07/2020 04/07/2020 04/07/2020  Glucose 70 - 99 mg/dL 76(E) 83(T) 71  BUN 8 - 23 mg/dL 5(L) <5(V) <7(O)  Creatinine 0.61 - 1.24 mg/dL 1.60(V) 3.71(G) 6.26(R)  Sodium 135 - 145 mmol/L 112(LL) 114(LL) 112(LL)  Potassium 3.5 - 5.1 mmol/L 3.8 3.8 3.9  Chloride 98 - 111 mmol/L 85(L) 88(L) 86(L)  CO2 22 - 32 mmol/L 18(L) 19(L) 19(L)  Calcium 8.9 - 10.3 mg/dL 8.0(L) 7.6(L) 7.8(L)  Total Protein 6.5 - 8.1 g/dL - - -  Total Bilirubin 0.3 - 1.2 mg/dL - - -  Alkaline Phos 38 - 126 U/L - - -  AST 15 - 41 U/L - - -  ALT 0 - 44 U/L - - -       Micro Results Recent Results (from the past 240 hour(s))  SARS Coronavirus 2 by RT PCR (hospital order, performed in [facility] hospital lab) Nasopharyngeal Nasopharyngeal Swab     Status: None   Collection Time: 03/28/20 12:40 PM   Specimen: Nasopharyngeal Swab  Result Value Ref Range Status   SARS Coronavirus 2 NEGATIVE NEGATIVE Final    Comment: (NOTE) SARS-CoV-2 target nucleic acids are NOT DETECTED.  The SARS-CoV-2 RNA is generally detectable in upper and lower respiratory specimens during the acute phase of infection. The lowest concentration of SARS-CoV-2 viral copies this assay can detect is 250 copies / mL. A negative  result does not preclude SARS-CoV-2 infection and should not be used as the sole basis for treatment or other patient management  decisions.  A negative result may occur with improper specimen collection / handling, submission of specimen other than nasopharyngeal swab, presence of viral mutation(s) within the areas targeted by this assay, and inadequate number of viral copies (<250 copies / mL). A negative result must be combined with clinical observations, patient history, and epidemiological information.  Fact Sheet for Patients:   BoilerBrush.com.cy  Fact Sheet for Healthcare Providers: https://pope.com/  This test is not yet approved or  cleared by the [place] FDA and has been authorized for detection and/or diagnosis of SARS-CoV-2 by FDA under an Emergency Use Authorization (EUA).  This EUA will remain in effect (meaning this test can be used) for the duration of the COVID-19 declaration under Section 564(b)(1) of the Act, 21 U.S.C. section 360bbb-3(b)(1), unless the authorization is terminated or revoked sooner.  Performed at [facility], [address] [place], [place] [address]   SARS Coronavirus 2 by RT PCR (hospital order, performed in [facility] hospital lab) Nasopharyngeal Nasopharyngeal Swab     Status: None   Collection Time: 04/06/20  4:00 PM   Specimen: Nasopharyngeal Swab  Result Value Ref Range Status   SARS Coronavirus 2 NEGATIVE NEGATIVE Final    Comment: (NOTE) SARS-CoV-2 target nucleic acids are NOT DETECTED.  The SARS-CoV-2 RNA is generally detectable in upper and lower respiratory specimens during the acute phase of infection. The lowest concentration of SARS-CoV-2 viral copies this assay can detect is 250 copies / mL. A negative result does not preclude SARS-CoV-2 infection and should not be used as the sole basis for treatment or other patient management decisions.  A negative result may  occur with improper specimen collection / handling, submission of specimen other than nasopharyngeal swab, presence of viral mutation(s) within the areas targeted by this assay, and inadequate number of viral copies (<250 copies / mL). A negative result must be combined with clinical observations, patient history, and epidemiological information.  Fact Sheet for Patients:   BoilerBrush.com.cy  Fact Sheet for Healthcare Providers: https://pope.com/  This test is not yet approved or  cleared by the [place] FDA and has been authorized for detection and/or diagnosis of SARS-CoV-2 by FDA under an Emergency Use Authorization (EUA).  This EUA will remain in effect (meaning this test can be used) for the duration of the COVID-19 declaration under Section 564(b)(1) of the Act, 21 U.S.C. section 360bbb-3(b)(1), unless the authorization is terminated or revoked sooner.  Performed at [facility], [address], [place], [place] [address]     Radiology Reports DG Chest 2 View  Result Date: 03/24/2020 CLINICAL DATA:  Altered mental status. EXAM: CHEST - 2 VIEW COMPARISON:  12/15/2019. FINDINGS: Mediastinum hilar structures normal. Stable cardiomegaly. No pulmonary venous congestion. No focal infiltrate. No pleural effusion or pneumothorax. Degenerative changes scoliosis thoracic spine. Degenerative changes both shoulders. Stable sclerotic changes right proximal humerus most likely secondary to infarct or enchondroma. IMPRESSION: Stable cardiomegaly. No acute cardiopulmonary disease. Previously identified pulmonary venous congestion has cleared. Electronically Signed   By: Maisie Fus  Register   On: 03/24/2020 10:39   CT Head Wo Contrast  Result Date: 04/06/2020 CLINICAL DATA:  Dysphagia. EXAM: CT HEAD WITHOUT CONTRAST TECHNIQUE: Contiguous axial images were obtained from the base of the skull through the vertex without intravenous  contrast. COMPARISON:  March 24, 2020 FINDINGS: Brain: There is mild to moderate severity cerebral atrophy with widening of the extra-axial spaces and ventricular dilatation. There are areas of decreased attenuation within the white matter tracts  of the supratentorial brain, consistent with microvascular disease changes. 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 Vascular: No hyperdense vessel or unexpected calcification. Skull: Normal. Negative for fracture or focal lesion. Sinuses/Orbits: No acute finding. Other: None. IMPRESSION: 1. Generalized cerebral atrophy. 2. No acute intracranial abnormality. Electronically Signed   By: Aram Candela M.D.   On: 04/06/2020 15:58   CT Head Wo Contrast  Result Date: 03/24/2020 CLINICAL DATA:  Weakness, altered mental status EXAM: CT HEAD WITHOUT CONTRAST TECHNIQUE: Contiguous axial images were obtained from the base of the skull through the vertex without intravenous contrast. COMPARISON:  12/06/2019 FINDINGS: Brain: No evidence of acute infarction, hemorrhage, hydrocephalus, extra-axial collection or mass lesion/mass effect. 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 Moderate low-density changes within the periventricular and subcortical white matter compatible with chronic microvascular ischemic change. Moderate diffuse cerebral volume loss. Vascular: Atherosclerotic calcifications involving the large vessels of the skull base. No unexpected hyperdense vessel. Skull: Normal. Negative for fracture or focal lesion. Sinuses/Orbits: No acute finding. Other: None. IMPRESSION: 1. No acute intracranial findings. 2. Chronic microvascular ischemic change and cerebral volume loss. Electronically Signed   By: Duanne Guess D.O.   On: 03/24/2020 13:55   CT ABDOMEN PELVIS W CONTRAST  Result Date: 03/24/2020 CLINICAL DATA:  Buttock wound. EXAM: CT ABDOMEN AND PELVIS WITH CONTRAST TECHNIQUE: Multidetector CT imaging of the abdomen and pelvis was  performed using the standard protocol following bolus administration of intravenous contrast. CONTRAST:  66mL OMNIPAQUE IOHEXOL 300 MG/ML  SOLN COMPARISON:  Abdominal ultrasound 12/07/2019 FINDINGS: The study is mildly motion degraded. Lower chest: Minimal atelectasis or scarring in the lung bases. No pleural effusion. Three-vessel coronary atherosclerosis. Hepatobiliary: No focal liver abnormality is seen. Unremarkable gallbladder. No biliary dilatation. Pancreas: Unremarkable. Spleen: Unremarkable. Adrenals/Urinary Tract: Unremarkable adrenal glands. No evidence of renal mass or hydronephrosis. Punctate nonobstructing calculus or vascular calcification in the right renal hilum. Unremarkable bladder. Stomach/Bowel: The stomach is unremarkable. There is a moderate amount of stool in the rectum with a small amount of scattered stool in the colon. There is no evidence of bowel obstruction. Evaluation for bowel inflammation is limited by motion, paucity of abdominal fat, and absence of oral contrast. Unremarkable appendix. Vascular/Lymphatic: Abdominal aortic atherosclerosis without aneurysm. Heavily calcified plaque at the celiac, superior mesenteric, and bilateral renal artery origins with potentially flow limiting stenoses. No enlarged lymph nodes. Reproductive: Mildly enlarged prostate. Other: No ascites or pneumoperitoneum. Right buttock soft tissue ulceration inferior to the ischium with surrounding soft tissue thickening and stranding extending into the included portion of the right thigh and perineum. No fluid collection, dissecting subcutaneous emphysema, or regional osseous destruction identified. 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 IMPRESSION: 1. Inferior right buttock ulceration with surrounding inflammation extending into the included portion of the right thigh and perineum. No abscess. No evidence of necrotizing fasciitis or osteomyelitis. 2. Moderate rectal stool burden. 3.  Aortic Atherosclerosis (ICD10-I70.0). Electronically Signed   By: Sebastian Ache M.D.   On: 03/24/2020 14:00   DG Chest Portable 1 View  Result Date: 04/06/2020 CLINICAL DATA:  Cough. EXAM: PORTABLE CHEST 1 VIEW COMPARISON:  March 24, 2020 FINDINGS: The lungs are mildly hyperinflated. Mild diffuse chronic appearing increased lung markings are seen. There is no evidence of acute infiltrate, pleural effusion or pneumothorax. The heart size and mediastinal contours are within normal limits. The visualized skeletal structures are unremarkable. IMPRESSION: Chronic appearing increased lung markings without evidence of acute or active cardiopulmonary disease.  Electronically Signed   By: Aram Candela M.D.   On: 04/06/2020 15:54    SIGNED: Kendell Bane, MD, FACP, FHM. [facility],  Pager (please use [email or web address] to page/text)  If 7PM-7AM, please contact night-coverage [email or web address] 04/07/2020, 10:56 AM   Critical care management, greater than 55 minutes-was spent evaluating seeing patient reviewing records placing order drawing plan of care with consultants, calling consultants.

## 2020-04-07 NOTE — ED Notes (Signed)
PT cleaned, linens changed, new brief.  Miplex applied to buttock wound, external catheter applied.

## 2020-04-07 NOTE — Progress Notes (Signed)
PHARMACY CONSULT NOTE - FOLLOW UP  Pharmacy Consult for Electrolyte Monitoring and Replacement   Recent Labs: Potassium (mmol/L)  Date Value  04/07/2020 4.1   Magnesium (mg/dL)  Date Value  41/96/2229 1.9   Calcium (mg/dL)  Date Value  79/89/2119 7.9 (L)   Albumin (g/dL)  Date Value  41/74/0814 3.0 (L)   Phosphorus (mg/dL)  Date Value  48/18/5631 2.5   Sodium (mmol/L)  Date Value  04/07/2020 112 (LL)   Assessment:  74 y.o. male with PMH of chronic alcohol use, HTN, recent admission for sepsis and malnutrition who presents from ALF apparently for difficulty swallowing. Patient is responsive on exam but non-verbal; apparently mental status is at baseline per report. Per report from facility  Na 108, pt started on 3% NaCl at 51ml/hr 0820 0154 Na 109, continue current plan  0820 0402 Na 112, continue current plan  Goal of Therapy:  Lytes WNL  Plan:  Monitor Labs & Na and adjust per protocol  Wayland Denis ,PharmD Clinical Pharmacist 04/07/2020 4:55 AM

## 2020-04-07 NOTE — Progress Notes (Signed)
PHARMACY CONSULT NOTE - FOLLOW UP  Pharmacy Consult for Electrolyte Monitoring and Replacement   Recent Labs: Potassium (mmol/L)  Date Value  04/07/2020 4.2   Magnesium (mg/dL)  Date Value  33/29/5188 1.9   Calcium (mg/dL)  Date Value  41/66/0630 7.9 (L)   Albumin (g/dL)  Date Value  16/08/930 3.0 (L)   Phosphorus (mg/dL)  Date Value  35/57/3220 2.5   Sodium (mmol/L)  Date Value  04/07/2020 109 (LL)   Assessment:  74 y.o. male with PMH of chronic alcohol use, HTN, recent admission for sepsis and malnutrition who presents from ALF apparently for difficulty swallowing. Patient is responsive on exam but non-verbal; apparently mental status is at baseline per report. Per report from facility  Na 108, pt started on 3% NaCl at 40ml/hr 0820 0154 Na 109, continue current plan  Goal of Therapy:  Lytes WNL  Plan:  Monitor Labs & Na and adjust per protocol  Wayland Denis ,PharmD Clinical Pharmacist 04/07/2020 4:12 AM

## 2020-04-07 NOTE — Progress Notes (Signed)
PHARMACY CONSULT NOTE  Pharmacy Consult for Electrolyte Monitoring and Replacement   Recent Labs: Potassium (mmol/L)  Date Value  04/07/2020 3.8   Magnesium (mg/dL)  Date Value  39/53/2023 1.9   Calcium (mg/dL)  Date Value  34/35/6861 8.0 (L)   Albumin (g/dL)  Date Value  68/37/2902 3.0 (L)   Phosphorus (mg/dL)  Date Value  07/04/5207 2.5   Sodium (mmol/L)  Date Value  04/07/2020 113 (LL)   Assessment:  74 y.o. male with PMH of chronic alcohol use, HTN, recent admission for sepsis and malnutrition who presents from ALF apparently for difficulty swallowing. Patient is responsive on exam but non-verbal; apparently mental status is at baseline per report. Per report from facility  Na 108, pt started on 3% NaCl at 58ml/hr 0820 0154 Na 109, continue current plan  0820 0402 Na 112, continue current plan 0820 0624 Na 114, 3% NaCl decreased to 20 ml/hr 0820 0810 Na 112, continue current plan 0820 1008 Na 115, continue current plan 0820 1130 MD added D5NS at 125 mL/hr 0820 1254 Na 113, continue current plan  Goal of Therapy:  Recommended rate of correction = no greater than 0.5 mmol/L/hr and no greater than 12 mmol/L/day  Plan:  --0820 1254 Na 113, continue 3% NaCl at 20 mL/hr + D5NS at 125 mL/hr --Sodium checks currently ordered q2h while on hypertonic saline  Tressie Ellis 04/07/2020 3:31 PM

## 2020-04-08 ENCOUNTER — Inpatient Hospital Stay: Payer: Self-pay

## 2020-04-08 LAB — BASIC METABOLIC PANEL
Anion gap: 10 (ref 5–15)
Anion gap: 8 (ref 5–15)
Anion gap: 12 (ref 5–15)
Anion gap: 10 (ref 5–15)
Anion gap: 10 (ref 5–15)
Anion gap: 10 (ref 5–15)
Anion gap: 13 (ref 5–15)
Anion gap: 16 — ABNORMAL HIGH (ref 5–15)
Anion gap: 8 (ref 5–15)
BUN: <5 mg/dL — ABNORMAL LOW (ref 8–23)
BUN: <5 mg/dL — ABNORMAL LOW (ref 8–23)
BUN: <5 mg/dL — ABNORMAL LOW (ref 8–23)
BUN: <5 mg/dL — ABNORMAL LOW (ref 8–23)
BUN: <5 mg/dL — ABNORMAL LOW (ref 8–23)
BUN: <5 mg/dL — ABNORMAL LOW (ref 8–23)
BUN: <5 mg/dL — ABNORMAL LOW (ref 8–23)
BUN: <5 mg/dL — ABNORMAL LOW (ref 8–23)
BUN: <5 mg/dL — ABNORMAL LOW (ref 8–23)
CO2: 18 mmol/L — ABNORMAL LOW (ref 22–32)
CO2: 19 mmol/L — ABNORMAL LOW (ref 22–32)
CO2: 14 mmol/L — ABNORMAL LOW (ref 22–32)
CO2: 17 mmol/L — ABNORMAL LOW (ref 22–32)
CO2: 17 mmol/L — ABNORMAL LOW (ref 22–32)
CO2: 15 mmol/L — ABNORMAL LOW (ref 22–32)
CO2: 18 mmol/L — ABNORMAL LOW (ref 22–32)
CO2: 11 mmol/L — ABNORMAL LOW (ref 22–32)
CO2: 20 mmol/L — ABNORMAL LOW (ref 22–32)
Calcium: 7.9 mg/dL — ABNORMAL LOW (ref 8.9–10.3)
Calcium: 7.8 mg/dL — ABNORMAL LOW (ref 8.9–10.3)
Calcium: 7.7 mg/dL — ABNORMAL LOW (ref 8.9–10.3)
Calcium: 7.8 mg/dL — ABNORMAL LOW (ref 8.9–10.3)
Calcium: 7.7 mg/dL — ABNORMAL LOW (ref 8.9–10.3)
Calcium: 7.5 mg/dL — ABNORMAL LOW (ref 8.9–10.3)
Calcium: 7.6 mg/dL — ABNORMAL LOW (ref 8.9–10.3)
Calcium: 6.5 mg/dL — ABNORMAL LOW (ref 8.9–10.3)
Calcium: 7.9 mg/dL — ABNORMAL LOW (ref 8.9–10.3)
Chloride: 90 mmol/L — ABNORMAL LOW (ref 98–111)
Chloride: 93 mmol/L — ABNORMAL LOW (ref 98–111)
Chloride: 93 mmol/L — ABNORMAL LOW (ref 98–111)
Chloride: 94 mmol/L — ABNORMAL LOW (ref 98–111)
Chloride: 92 mmol/L — ABNORMAL LOW (ref 98–111)
Chloride: 90 mmol/L — ABNORMAL LOW (ref 98–111)
Chloride: 90 mmol/L — ABNORMAL LOW (ref 98–111)
Chloride: 97 mmol/L — ABNORMAL LOW (ref 98–111)
Chloride: 92 mmol/L — ABNORMAL LOW (ref 98–111)
Creatinine, Ser: 0.41 mg/dL — ABNORMAL LOW (ref 0.61–1.24)
Creatinine, Ser: <0.3 mg/dL — ABNORMAL LOW (ref 0.61–1.24)
Creatinine, Ser: 0.35 mg/dL — ABNORMAL LOW (ref 0.61–1.24)
Creatinine, Ser: 0.34 mg/dL — ABNORMAL LOW (ref 0.61–1.24)
Creatinine, Ser: 0.35 mg/dL — ABNORMAL LOW (ref 0.61–1.24)
Creatinine, Ser: <0.3 mg/dL — ABNORMAL LOW (ref 0.61–1.24)
Creatinine, Ser: <0.3 mg/dL — ABNORMAL LOW (ref 0.61–1.24)
Creatinine, Ser: <0.3 mg/dL — ABNORMAL LOW (ref 0.61–1.24)
Creatinine, Ser: <0.3 mg/dL — ABNORMAL LOW (ref 0.61–1.24)
GFR calc Af Amer: >60 mL/min (ref >60)
GFR calc Af Amer: >60 mL/min (ref >60)
GFR calc Af Amer: >60 mL/min (ref >60)
GFR calc Af Amer: >60 mL/min (ref >60)
GFR calc non Af Amer: >60 mL/min (ref >60)
GFR calc non Af Amer: >60 mL/min (ref >60)
GFR calc non Af Amer: >60 mL/min (ref >60)
GFR calc non Af Amer: >60 mL/min (ref >60)
Glucose, Bld: 160 mg/dL — ABNORMAL HIGH (ref 70–99)
Glucose, Bld: 148 mg/dL — ABNORMAL HIGH (ref 70–99)
Glucose, Bld: 216 mg/dL — ABNORMAL HIGH (ref 70–99)
Glucose, Bld: 198 mg/dL — ABNORMAL HIGH (ref 70–99)
Glucose, Bld: 158 mg/dL — ABNORMAL HIGH (ref 70–99)
Glucose, Bld: 127 mg/dL — ABNORMAL HIGH (ref 70–99)
Glucose, Bld: 93 mg/dL (ref 70–99)
Glucose, Bld: 43 mg/dL — CL (ref 70–99)
Glucose, Bld: 102 mg/dL — ABNORMAL HIGH (ref 70–99)
Potassium: 3.4 mmol/L — ABNORMAL LOW (ref 3.5–5.1)
Potassium: 3.5 mmol/L (ref 3.5–5.1)
Potassium: 3.8 mmol/L (ref 3.5–5.1)
Potassium: 3.8 mmol/L (ref 3.5–5.1)
Potassium: 3.2 mmol/L — ABNORMAL LOW (ref 3.5–5.1)
Potassium: 4 mmol/L (ref 3.5–5.1)
Potassium: 3.3 mmol/L — ABNORMAL LOW (ref 3.5–5.1)
Potassium: 3.6 mmol/L (ref 3.5–5.1)
Potassium: 3.2 mmol/L — ABNORMAL LOW (ref 3.5–5.1)
Sodium: 118 mmol/L — CL (ref 135–145)
Sodium: 120 mmol/L — ABNORMAL LOW (ref 135–145)
Sodium: 119 mmol/L — CL (ref 135–145)
Sodium: 121 mmol/L — ABNORMAL LOW (ref 135–145)
Sodium: 119 mmol/L — CL (ref 135–145)
Sodium: 115 mmol/L — CL (ref 135–145)
Sodium: 121 mmol/L — ABNORMAL LOW (ref 135–145)
Sodium: 124 mmol/L — ABNORMAL LOW (ref 135–145)
Sodium: 120 mmol/L — ABNORMAL LOW (ref 135–145)

## 2020-04-08 LAB — CBC
HCT: 23.9 % — ABNORMAL LOW (ref 39.0–52.0)
Hemoglobin: 8.6 g/dL — ABNORMAL LOW (ref 13.0–17.0)
MCH: 27.4 pg (ref 26.0–34.0)
MCHC: 36 g/dL (ref 30.0–36.0)
MCV: 76.1 fL — ABNORMAL LOW (ref 80.0–100.0)
Platelets: 355 10*3/uL (ref 150–400)
RBC: 3.14 MIL/uL — ABNORMAL LOW (ref 4.22–5.81)
RDW: 17.1 % — ABNORMAL HIGH (ref 11.5–15.5)
WBC: 7.8 10*3/uL (ref 4.0–10.5)
nRBC: 0 % (ref 0.0–0.2)

## 2020-04-08 LAB — MRSA PCR SCREENING: MRSA by PCR: NEGATIVE

## 2020-04-08 LAB — GLUCOSE, CAPILLARY: Glucose-Capillary: 157 mg/dL — ABNORMAL HIGH (ref 70–99)

## 2020-04-08 MED ORDER — DEXTROSE 50 % IV SOLN
INTRAVENOUS | Status: AC
  Filled 2020-04-08: qty 50

## 2020-04-08 MED ORDER — SODIUM CHLORIDE 3 % IV SOLN
INTRAVENOUS | Status: DC
  Filled 2020-04-08 (×2): qty 500

## 2020-04-08 MED ORDER — LACTATED RINGERS IV SOLN: INTRAVENOUS | Status: DC

## 2020-04-08 MED ORDER — CHLORHEXIDINE GLUCONATE CLOTH 2 % EX PADS
6.0000 | MEDICATED_PAD | Freq: Every day | CUTANEOUS | Status: DC
  Administered 2020-04-08 – 2020-04-11 (×3): 6 via TOPICAL

## 2020-04-08 MED ORDER — DEXTROSE 50 % IV SOLN
1.0000 | INTRAVENOUS | Status: AC
  Administered 2020-04-08: 50 mL via INTRAVENOUS

## 2020-04-08 MED ORDER — SODIUM CHLORIDE 3 % IV SOLN
20.0000 mL/h | INTRAVENOUS | Status: DC
  Administered 2020-04-08: 20 mL/h via INTRAVENOUS
  Filled 2020-04-08: qty 500

## 2020-04-08 NOTE — ED Notes (Signed)
Dr. Flossie Dibble aware of Na+ of 115.

## 2020-04-08 NOTE — ED Notes (Signed)
Patient's linens and gown changed. New briefs are in place. Patient was repositioned on stretcher.

## 2020-04-08 NOTE — Progress Notes (Signed)
PHARMACY CONSULT NOTE  Pharmacy Consult for Electrolyte Monitoring and Replacement   Recent Labs: Potassium (mmol/L)  Date Value  04/08/2020 3.5   Magnesium (mg/dL)  Date Value  35/57/3220 1.9   Calcium (mg/dL)  Date Value  25/42/7062 7.8 (L)   Albumin (g/dL)  Date Value  37/62/8315 3.0 (L)   Phosphorus (mg/dL)  Date Value  17/61/6073 2.5   Sodium (mmol/L)  Date Value  04/08/2020 120 (L)   Assessment:  74 y.o. male with PMH of chronic alcohol use, HTN, recent admission for sepsis and malnutrition who presents from ALF apparently for difficulty swallowing. Patient is responsive on exam but non-verbal; apparently mental status is at baseline per report. Per report from facility  Na 108, pt started on 3% NaCl at 62ml/hr 0820 0154 Na 109, continue current plan  0820 0402 Na 112, continue current plan 0820 0624 Na 114, 3% NaCl decreased to 20 ml/hr 0820 0810 Na 112, continue current plan 0820 1008 Na 115, continue current plan 0820 1130 MD added D5NS at 125 mL/hr 0820 1254 Na 113, continue current plan 0820 1454 Na 113, continue current plan 0820 1711 Na 115, continue current rate 0820 2059 Na 117, continue current rate 0821 0213 Na 118, continue current rate  0821 0534 Na 120,  3% saline was ordered x 17 hrs, order has expired  Goal of Therapy:  Recommended rate of correction = no greater than 0.5 mmol/L/hr and no greater than 12 mmol/L/day  Plan:  -- 3% NaCl at 20 mL/hr order has expired -pt on  D5NS at 125 mL/hr --Sodium checks currently ordered q4h while on hypertonic saline  Tiffanee Mcnee A 04/08/2020 8:21 AM

## 2020-04-08 NOTE — Progress Notes (Signed)
PHARMACY CONSULT NOTE  Pharmacy Consult for Electrolyte Monitoring and Replacement   Recent Labs: Potassium (mmol/L)  Date Value  04/08/2020 3.3 (L)   Magnesium (mg/dL)  Date Value  13/03/6577 1.9   Calcium (mg/dL)  Date Value  46/96/2952 7.6 (L)   Albumin (g/dL)  Date Value  84/13/2440 3.0 (L)   Phosphorus (mg/dL)  Date Value  06/15/2535 2.5   Sodium (mmol/L)  Date Value  04/08/2020 121 (L)   Assessment:  74 y.o. male with PMH of chronic alcohol use, HTN, recent admission for sepsis and malnutrition who presents from ALF apparently for difficulty swallowing. Patient is responsive on exam but non-verbal; apparently mental status is at baseline per report. Per report from facility  Na 108, pt started on 3% NaCl at 54ml/hr 0820 0154 Na 109, continue current plan  0820 0402 Na 112, continue current plan 0820 0624 Na 114, 3% NaCl decreased to 20 ml/hr 0820 0810 Na 112, continue current plan 0820 1008 Na 115, continue current plan 0820 1130 MD added D5NS at 125 mL/hr 0820 1254 Na 113, continue current plan 0820 1454 Na 113, continue current plan 0820 1711 Na 115, continue current rate 0820 2059 Na 117, continue current rate 0821 0213 Na 118, continue current rate  0821 0534 Na 120,  3% saline was ordered x 17 hrs, order has expired 0821 1038 Na 119, 3% saline drip restarted at 0923. Per nephrology note will increase drip to 30 ml/hr 0821 1251 Na 115  Rate changed to 30 ml/hr at 1351, continue current rate 0821 1543 Na 121 >> messaged nephrology (115 to 121?). Will change rate to 25 ml/hr.   Goal of Therapy:  Recommended rate of correction = no greater than 0.5 mmol/L/hr and no greater than 12 mmol/L/day  Plan:  -- decrease 3% NaCl drip to 25  mL/hr. (Per discussion with Nephrology). --Sodium checks currently ordered q4h while on hypertonic saline  Kenise Barraco A 04/08/2020 6:01 PM

## 2020-04-08 NOTE — ED Notes (Signed)
Tubing changed on 3%NS infusion in right upper arm.

## 2020-04-08 NOTE — Progress Notes (Signed)
PHARMACY CONSULT NOTE  Pharmacy Consult for Electrolyte Monitoring and Replacement   Recent Labs: Potassium (mmol/L)  Date Value  04/08/2020 3.4 (L)   Magnesium (mg/dL)  Date Value  79/48/0165 1.9   Calcium (mg/dL)  Date Value  53/74/8270 7.9 (L)   Albumin (g/dL)  Date Value  78/67/5449 3.0 (L)   Phosphorus (mg/dL)  Date Value  20/05/711 2.5   Sodium (mmol/L)  Date Value  04/08/2020 118 (LL)   Assessment:  75 y.o. male with PMH of chronic alcohol use, HTN, recent admission for sepsis and malnutrition who presents from ALF apparently for difficulty swallowing. Patient is responsive on exam but non-verbal; apparently mental status is at baseline per report. Per report from facility  Na 108, pt started on 3% NaCl at 35ml/hr 0820 0154 Na 109, continue current plan  0820 0402 Na 112, continue current plan 0820 0624 Na 114, 3% NaCl decreased to 20 ml/hr 0820 0810 Na 112, continue current plan 0820 1008 Na 115, continue current plan 0820 1130 MD added D5NS at 125 mL/hr 0820 1254 Na 113, continue current plan 0820 1454 Na 113, continue current plan 0820 1711 Na 115, continue current rate 0820 2059 Na 117, continue current rate 0821 0213 Na 118, continue current rate  Goal of Therapy:  Recommended rate of correction = no greater than 0.5 mmol/L/hr and no greater than 12 mmol/L/day  Plan:  --continue 3% NaCl at 20 mL/hr + D5NS at 125 mL/hr --Sodium checks currently ordered q4h while on hypertonic saline  Valrie Hart A 04/08/2020 3:33 AM

## 2020-04-08 NOTE — Progress Notes (Signed)
PHARMACY CONSULT NOTE  Pharmacy Consult for Electrolyte Monitoring and Replacement   Recent Labs: Potassium (mmol/L)  Date Value  04/08/2020 3.2 (L)   Magnesium (mg/dL)  Date Value  96/28/3662 1.9   Calcium (mg/dL)  Date Value  94/76/5465 7.9 (L)   Albumin (g/dL)  Date Value  03/54/6568 3.0 (L)   Phosphorus (mg/dL)  Date Value  12/75/1700 2.5   Sodium (mmol/L)  Date Value  04/08/2020 120 (L)   Assessment:  74 y.o. male with PMH of chronic alcohol use, HTN, recent admission for sepsis and malnutrition who presents from ALF apparently for difficulty swallowing. Patient is responsive on exam but non-verbal; apparently mental status is at baseline per report. Per report from facility  Na 108, pt started on 3% NaCl at 4ml/hr 0820 0154 Na 109, continue current plan  0820 0402 Na 112, continue current plan 0820 0624 Na 114, 3% NaCl decreased to 20 ml/hr 0820 0810 Na 112, continue current plan 0820 1008 Na 115, continue current plan 0820 1130 MD added D5NS at 125 mL/hr 0820 1254 Na 113, continue current plan 0820 1454 Na 113, continue current plan 0820 1711 Na 115, continue current rate 0820 2059 Na 117, continue current rate 0821 0213 Na 118, continue current rate  0821 0534 Na 120,  3% saline was ordered x 17 hrs, order has expired 0821 1038 Na 119, 3% saline drip restarted at 0923. Per nephrology note will increase drip to 30 ml/hr 0821 1251 Na 115  Rate changed to 30 ml/hr at 1351, continue current rate 0821 1543 Na 121 >> messaged nephrology (115 to 121?). Will change rate to 25 ml/hr.  8/21 1847 Na 124 > NaCl was continued at 48ml/hr 8/21 2222 Na 120 > trending down and NaCl still infusing at 1ml/hr.  D/W Dr Thedore Mins and will increase rate to 44ml/hr and continue to monitor  Goal of Therapy:  Recommended rate of correction = no greater than 0.5 mmol/L/hr and no greater than 12 mmol/L/day  Plan:  -- Increase 3% NaCl drip to 30 mL/hr. (Per discussion with  Nephrology). -- Continue Na checks q4h while on hypertonic saline  Valrie Hart A 04/08/2020 11:54 PM

## 2020-04-08 NOTE — Progress Notes (Addendum)
Assessed pt for PICC line.  Unable to abduct either arm to place PICC line.  20 G 1.88" catheter inserted in RUA cephalic vein for 3% NS infusion and labs. Recommended central line or IR placed PICC if more access is needed. Securechat sent to Dr Flossie Dibble to notify of the above.  Sonjia RN also notified.

## 2020-04-08 NOTE — Progress Notes (Signed)
PHARMACY CONSULT NOTE  Pharmacy Consult for Electrolyte Monitoring and Replacement   Recent Labs: Potassium (mmol/L)  Date Value  04/08/2020 4.0   Magnesium (mg/dL)  Date Value  81/82/9937 1.9   Calcium (mg/dL)  Date Value  16/96/7893 7.5 (L)   Albumin (g/dL)  Date Value  81/08/7508 3.0 (L)   Phosphorus (mg/dL)  Date Value  25/85/2778 2.5   Sodium (mmol/L)  Date Value  04/08/2020 115 (LL)   Assessment:  74 y.o. male with PMH of chronic alcohol use, HTN, recent admission for sepsis and malnutrition who presents from ALF apparently for difficulty swallowing. Patient is responsive on exam but non-verbal; apparently mental status is at baseline per report. Per report from facility  Na 108, pt started on 3% NaCl at 74ml/hr 0820 0154 Na 109, continue current plan  0820 0402 Na 112, continue current plan 0820 0624 Na 114, 3% NaCl decreased to 20 ml/hr 0820 0810 Na 112, continue current plan 0820 1008 Na 115, continue current plan 0820 1130 MD added D5NS at 125 mL/hr 0820 1254 Na 113, continue current plan 0820 1454 Na 113, continue current plan 0820 1711 Na 115, continue current rate 0820 2059 Na 117, continue current rate 0821 0213 Na 118, continue current rate  0821 0534 Na 120,  3% saline was ordered x 17 hrs, order has expired 0821 1038 Na 119, 3% saline drip restarted at 0923. Per nephrology note will increase drip to 30 ml/hr 0821 1251 Na 115  Rate changed to 30 ml/hr at 1351, continue current rate  Goal of Therapy:  Recommended rate of correction = no greater than 0.5 mmol/L/hr and no greater than 12 mmol/L/day  Plan:  -- 3% NaCl drip at 30 mL/hr. (Per Nephrology note can increase to 30 ml/hr). --Sodium checks currently ordered q4h while on hypertonic saline  Shauniece Kwan A 04/08/2020 3:15 PM

## 2020-04-08 NOTE — Progress Notes (Signed)
Telephone consent obtained from niece, Chauncey Cruel.  Sonjia RN notified.

## 2020-04-08 NOTE — Progress Notes (Signed)
[address] [place], [place] [address] Phone [phone]. Fax [phone]  Date: 04/08/2020                  Patient Name:  Javier Robinson  MRN: [id number]  DOB: 31-Oct-1945  Age / Sex: 74 y.o., male         PCP: Charlott Rakes, MD                 Service Requesting Consult: IM/ Kendell Bane, MD                 Reason for Consult: ARF            History of Present Illness: Patient is a 74 y.o. male with medical problems of chronic alcohol use, hypertension, recent admission for sepsis and malnutrition, who was admitted to [facility] on 04/06/2020 for evaluation of difficulty swallowing.  Patient is a resident of [facility].  He was sent for difficulty swallowing to even pured foods and solids.  He has been more drowsy couple days prior to admission Evaluation in the emergency room yesterday showed sodium of 110.  Baseline sodium appears to be 130-132 from August 11. Nephrology consult has now been requested for urgent evaluation of hyponatremia Medications prior to admission include sodium chloride tablets 1 g 3 times per day No Diuretic  Patient is currently lethargic- not able to provide information Baseline Mental stratus is unknown Patient was started on 3% saline at 20 cc/h on 8/20 morning.    Sodium has improved from 112 yesterday morning to 119-121 this morning   Vital Signs: Blood pressure 135/64, pulse 83, temperature (!) 97.2 F (36.2 C), temperature source Axillary, resp. rate 15, height 5\' 8"  (1.727 m), weight 63.5 kg, SpO2 93 %.   Intake/Output Summary (Last 24 hours) at 04/08/2020 1133 Last data filed at 04/08/2020 04/10/2020 Gross per 24 hour  Intake 2670.89 ml  Output --  Net 2670.89 ml    Weight trends: 2248   04/06/20 1456  Weight: 63.5 kg    Physical Exam: General:  Thin, cachectic, chronically ill-appearing, lying curled up  HEENT  dry oral mucous membranes, poor dentition  Lungs:  Normal breathing effort,  decreased breath sounds at bases, room air  Heart::  No rub, regular  Abdomen:  Soft, nontender, nondistended  Extremities:  No peripheral edema,   Neurologic:  Lethargic, was able to wake up to voice.  Not able to have meaningful conversation  Skin:  Decreased turgor       Lab results: Basic Metabolic Panel: Recent Labs  Lab 04/08/20 0759 04/08/20 0928 04/08/20 1038  NA 119* 121* 119*  K 3.8 3.8 3.2*  CL 93* 94* 92*  CO2 14* 17* 17*  GLUCOSE 216* 198* 158*  BUN <5* <5* <5*  CREATININE 0.35* 0.34* 0.35*  CALCIUM 7.7* 7.8* 7.7*    Liver Function Tests: Recent Labs  Lab 04/06/20 1444  AST 27  ALT 13  ALKPHOS 75  BILITOT 0.7  PROT 7.9  ALBUMIN 3.0*   No results for input(s): LIPASE, AMYLASE in the last 168 hours. No results for input(s): AMMONIA in the last 168 hours.  CBC: Recent Labs  Lab 04/06/20 1444 04/06/20 2224 04/07/20 0537 04/08/20 0534  WBC 8.1   < > 11.4* 7.8  NEUTROABS 6.5  --   --   --   HGB 9.7*   < > 8.8* 8.6*  HCT 28.7*   < > 24.3* 23.9*  MCV 78.6*   < >  75.7* 76.1*  PLT 358   < > 359 355   < > = values in this interval not displayed.    Cardiac Enzymes: No results for input(s): CKTOTAL, TROPONINI in the last 168 hours.  BNP: Invalid input(s): POCBNP  CBG: Recent Labs  Lab 04/06/20 1953 04/06/20 2110 04/06/20 2222 04/07/20 0156  GLUCAP 85 66* 124* 81    Microbiology: Recent Results (from the past 720 hour(s))  Blood Culture (routine x 2)     Status: None   Collection Time: 03/24/20  1:08 PM   Specimen: BLOOD  Result Value Ref Range Status   Specimen Description BLOOD  Final   Special Requests NONE  Final   Culture   Final    NO GROWTH 5 DAYS Performed at [facility], [address] [place], [place] [address]    Report Status 03/29/2020 FINAL  Final  Urine culture     Status: Abnormal   Collection Time: 03/24/20  1:08 PM   Specimen: In/Out Cath Urine  Result Value Ref Range Status   Specimen Description    Final    IN/OUT CATH URINE Performed at [facility], [address] [place], [place] [address]    Special Requests   Final    NONE Performed at [facility], [address] [place], [place] [address]    Culture MULTIPLE SPECIES PRESENT, SUGGEST RECOLLECTION (A)  Final   Report Status 03/26/2020 FINAL  Final  SARS Coronavirus 2 by RT PCR (hospital order, performed in [facility] hospital lab) Nasopharyngeal Nasopharyngeal Swab     Status: None   Collection Time: 03/24/20  4:36 PM   Specimen: Nasopharyngeal Swab  Result Value Ref Range Status   SARS Coronavirus 2 NEGATIVE NEGATIVE Final    Comment: (NOTE) SARS-CoV-2 target nucleic acids are NOT DETECTED.  The SARS-CoV-2 RNA is generally detectable in upper and lower respiratory specimens during the acute phase of infection. The lowest concentration of SARS-CoV-2 viral copies this assay can detect is 250 copies / mL. A negative result does not preclude SARS-CoV-2 infection and should not be used as the sole basis for treatment or other patient management decisions.  A negative result may occur with improper specimen collection / handling, submission of specimen other than nasopharyngeal swab, presence of viral mutation(s) within the areas targeted by this assay, and inadequate number of viral copies (<250 copies / mL). A negative result must be combined with clinical observations, patient history, and epidemiological information.  Fact Sheet for Patients:   BoilerBrush.com.cyhttps://www.fda.gov/media/136312/download  Fact Sheet for Healthcare Providers: https://pope.com/https://www.fda.gov/media/136313/download  This test is not yet approved or  cleared by the [place] FDA and has been authorized for detection and/or diagnosis of SARS-CoV-2 by FDA under an Emergency Use Authorization (EUA).  This EUA will remain in effect (meaning this test can be used) for the duration of the COVID-19 declaration under Section 564(b)(1) of the Act, 21  U.S.C. section 360bbb-3(b)(1), unless the authorization is terminated or revoked sooner.  Performed at [facility], [address] [place], [place] [address]   Blood Culture (routine x 2)     Status: None   Collection Time: 03/25/20 12:53 AM   Specimen: BLOOD  Result Value Ref Range Status   Specimen Description BLOOD BLOOD RIGHT HAND  Final   Special Requests   Final    BOTTLES DRAWN AEROBIC AND ANAEROBIC Blood Culture adequate volume   Culture   Final    NO GROWTH 5 DAYS Performed  at [facility], [address] [place], [place] [address]    Report Status 03/30/2020 FINAL  Final  SARS Coronavirus 2 by RT PCR (hospital order, performed in [facility] hospital lab) Nasopharyngeal Nasopharyngeal Swab     Status: None   Collection Time: 03/28/20 12:40 PM   Specimen: Nasopharyngeal Swab  Result Value Ref Range Status   SARS Coronavirus 2 NEGATIVE NEGATIVE Final    Comment: (NOTE) SARS-CoV-2 target nucleic acids are NOT DETECTED.  The SARS-CoV-2 RNA is generally detectable in upper and lower respiratory specimens during the acute phase of infection. The lowest concentration of SARS-CoV-2 viral copies this assay can detect is 250 copies / mL. A negative result does not preclude SARS-CoV-2 infection and should not be used as the sole basis for treatment or other patient management decisions.  A negative result may occur with improper specimen collection / handling, submission of specimen other than nasopharyngeal swab, presence of viral mutation(s) within the areas targeted by this assay, and inadequate number of viral copies (<250 copies / mL). A negative result must be combined with clinical observations, patient history, and epidemiological information.  Fact Sheet for Patients:   BoilerBrush.com.cy  Fact Sheet for Healthcare Providers: https://pope.com/  This test is not yet approved or  cleared by the  [place] FDA and has been authorized for detection and/or diagnosis of SARS-CoV-2 by FDA under an Emergency Use Authorization (EUA).  This EUA will remain in effect (meaning this test can be used) for the duration of the COVID-19 declaration under Section 564(b)(1) of the Act, 21 U.S.C. section 360bbb-3(b)(1), unless the authorization is terminated or revoked sooner.  Performed at [facility], [address] [place], [place] [address]   SARS Coronavirus 2 by RT PCR (hospital order, performed in [facility] hospital lab) Nasopharyngeal Nasopharyngeal Swab     Status: None   Collection Time: 04/06/20  4:00 PM   Specimen: Nasopharyngeal Swab  Result Value Ref Range Status   SARS Coronavirus 2 NEGATIVE NEGATIVE Final    Comment: (NOTE) SARS-CoV-2 target nucleic acids are NOT DETECTED.  The SARS-CoV-2 RNA is generally detectable in upper and lower respiratory specimens during the acute phase of infection. The lowest concentration of SARS-CoV-2 viral copies this assay can detect is 250 copies / mL. A negative result does not preclude SARS-CoV-2 infection and should not be used as the sole basis for treatment or other patient management decisions.  A negative result may occur with improper specimen collection / handling, submission of specimen other than nasopharyngeal swab, presence of viral mutation(s) within the areas targeted by this assay, and inadequate number of viral copies (<250 copies / mL). A negative result must be combined with clinical observations, patient history, and epidemiological information.  Fact Sheet for Patients:   BoilerBrush.com.cy  Fact Sheet for Healthcare Providers: https://pope.com/  This test is not yet approved or  cleared by the [place] FDA and has been authorized for detection and/or diagnosis of SARS-CoV-2 by FDA under an Emergency Use Authorization (EUA).  This EUA will  remain in effect (meaning this test can be used) for the duration of the COVID-19 declaration under Section 564(b)(1) of the Act, 21 U.S.C. section 360bbb-3(b)(1), unless the authorization is terminated or revoked sooner.  Performed at [facility], [address] [place], [place] [address]      Coagulation Studies: No results for input(s): LABPROT, INR in the last 72 hours.  Urinalysis: Recent Labs    04/06/20 1600  COLORURINE YELLOW*  LABSPEC 1.019  PHURINE 7.0  GLUCOSEU NEGATIVE  HGBUR NEGATIVE  BILIRUBINUR NEGATIVE  KETONESUR NEGATIVE  PROTEINUR NEGATIVE  NITRITE NEGATIVE  LEUKOCYTESUR NEGATIVE        Imaging: CT Head Wo Contrast  Result Date: 04/06/2020 CLINICAL DATA:  Dysphagia. EXAM: CT HEAD WITHOUT CONTRAST TECHNIQUE: Contiguous axial images were obtained from the base of the skull through the vertex without intravenous contrast. COMPARISON:  March 24, 2020 FINDINGS: Brain: There is mild to moderate severity cerebral atrophy with widening of the extra-axial spaces and ventricular dilatation. There are areas of decreased attenuation within the white matter tracts of the supratentorial brain, consistent with microvascular disease changes. 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 Vascular: No hyperdense vessel or unexpected calcification. Skull: Normal. Negative for fracture or focal lesion. Sinuses/Orbits: No acute finding. Other: None. IMPRESSION: 1. Generalized cerebral atrophy. 2. No acute intracranial abnormality. Electronically Signed   By: Aram Candela M.D.   On: 04/06/2020 15:58   DG Chest Portable 1 View  Result Date: 04/06/2020 CLINICAL DATA:  Cough. EXAM: PORTABLE CHEST 1 VIEW COMPARISON:  March 24, 2020 FINDINGS: The lungs are mildly hyperinflated. Mild diffuse chronic appearing increased lung markings are seen. There is no evidence of acute infiltrate, pleural effusion or pneumothorax. The heart size and mediastinal  contours are within normal limits. The visualized skeletal structures are unremarkable. IMPRESSION: Chronic appearing increased lung markings without evidence of acute or active cardiopulmonary disease. Electronically Signed   By: Aram Candela M.D.   On: 04/06/2020 15:54     Assessment & Plan: Pt is a 74 y.o. 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 Recent worsening likely secondary to malnutrition, decreased oral intake. Sodium did not correct with IV normal saline suggesting possibility of underlying SIADH. Agree with 3% hypertonic saline administration to increase sodium level Goal of correction 8 to 12 mEq in 24 hours. Continue to monitor Na closely iv hydration with maintenance LR   Increase rate of 3% to 30 cc/hr and may modify based on lab results Goal for correction to 128-130 by tomorrow AM   LOS: 2 Mckensie Scotti Thedore Mins 8/21/202111:33 AM    Note: This note was prepared with Dragon dictation. Any transcription errors are unintentional

## 2020-04-08 NOTE — Progress Notes (Signed)
Pt arrived to unit at this time. Pt is drowsy but wakes easily. He is confused. VSS on room air. 3% hypertonic saline infusing. Unstageable noted on buttocks.

## 2020-04-08 NOTE — ED Notes (Signed)
Two unsuccessful attempts to straight-stick for labs. ICU RN will be informed.

## 2020-04-08 NOTE — Progress Notes (Signed)
Got a glucose value of 43 from BMP lab. Megan, RN gave 1 amp D50 per hypoglycemia protocol. Vencil Basnett RN checked figer stick glucose and got a reading of 157.

## 2020-04-08 NOTE — ED Notes (Signed)
Lab called about BMP tube sent at 1543. Lab has the tube and will run it now.

## 2020-04-08 NOTE — Progress Notes (Signed)
PHARMACY CONSULT NOTE  Pharmacy Consult for Electrolyte Monitoring and Replacement   Recent Labs: Potassium (mmol/L)  Date Value  04/08/2020 3.2 (L)   Magnesium (mg/dL)  Date Value  01/60/1093 1.9   Calcium (mg/dL)  Date Value  23/55/7322 7.7 (L)   Albumin (g/dL)  Date Value  02/54/2706 3.0 (L)   Phosphorus (mg/dL)  Date Value  23/76/2831 2.5   Sodium (mmol/L)  Date Value  04/08/2020 119 (LL)   Assessment:  74 y.o. male with PMH of chronic alcohol use, HTN, recent admission for sepsis and malnutrition who presents from ALF apparently for difficulty swallowing. Patient is responsive on exam but non-verbal; apparently mental status is at baseline per report. Per report from facility  Na 108, pt started on 3% NaCl at 110ml/hr 0820 0154 Na 109, continue current plan  0820 0402 Na 112, continue current plan 0820 0624 Na 114, 3% NaCl decreased to 20 ml/hr 0820 0810 Na 112, continue current plan 0820 1008 Na 115, continue current plan 0820 1130 MD added D5NS at 125 mL/hr 0820 1254 Na 113, continue current plan 0820 1454 Na 113, continue current plan 0820 1711 Na 115, continue current rate 0820 2059 Na 117, continue current rate 0821 0213 Na 118, continue current rate  0821 0534 Na 120,  3% saline was ordered x 17 hrs, order has expired 0821 1038 Na 119, 3% saline drip restarted at 0923. Per nephrology note will increase drip to 30 ml/hr  Goal of Therapy:  Recommended rate of correction = no greater than 0.5 mmol/L/hr and no greater than 12 mmol/L/day  Plan:  -- 3% NaCl drip at 20 mL/hr. Per Nephrology note can increase to 30 ml/hr. --Sodium checks currently ordered q4h while on hypertonic saline  Dabria Wadas A 04/08/2020 12:32 PM

## 2020-04-08 NOTE — Progress Notes (Signed)
PROGRESS NOTE    Patient: Javier Robinson                            PCP: Charlott Rakes, MD                    DOB: 02/07/1946            DOA: 04/06/2020 [id number]             DOS: 04/08/2020, 12:12 PM   LOS: 2 days   Date of Service: The patient was seen and examined on 04/08/2020  Subjective:   The patient was seen and examined this morning, easily arousable, minimally verbal Seems to be pleasantly confused.  Does not move his left lower extremity likely due to  previous stroke. Hemodynamically stable. No issues overnight   Brief Narrative:   HPI: AMARI ZAGAL 74 yo M with PMHx  chronic alcohol use, HTN, recent admission for sepsis and malnutrition who presents from ALF apparently for difficulty swallowing,  generalized weakness, confusion, difficulty . Reviewed records, pt was just hospitalized for sepsis, d/c to SNF. No apparent focal deficits on exam. CT Head shows NAICA. CXR w/o PNA or apparent major aspiration.  Apparently mental status is at baseline per report. Per report from facility [facility])  Labs as above. BMP with profound hyponatremia ( Na 110) , acidemia, hypoglycemia. Suspect related to poor PO intake. NS at 100.   Assessment & Plan:   Active Problems:   Hypoglycemia   Hyponatremia   Alcohol dependence (HCC)   Essential hypertension   Protein-calorie malnutrition, severe   Microcytic anemia  Hyponatremia and Hypochloremia with Non Anion Gap Metabolic Acidosis  -Multifactorial including poor p.o. intake, dehydration --- ?  SIADH  -Appreciating nephrology assist, -Sodium on admission 108, 109, 112, 114, 112, 115 >> 119 now -Patient was started on hypertonic normal saline overnight--- continue titrating for slow correction -Per nephrology recommendations also continue with IV fluid resuscitation due to dehydration   On admission Na noted to be profoundly low at 110. Cl  79. Bicarb 17. Patient appears hypovolemic on admission. Does appear  that he sometimes has chronic mild hyponatremia in the low 130s, likely compounded by alcohol use. Not on thiazide diuretic or other offending agents. CT head was negative. Appears to be at baseline for mental status.   -urine electrolytes and osmolality studies pending    Dehydration/hypoglycemia  -Patient was also started on D5 normal saline, will switch to LR this a.m. per nephrology CBGs: 216, 195, 158 this AM. On admission glucose 49 at presentation. S uspect related to poor PO intake. Given D50 in ED.  -Checking CBG every 4 hours  HTN  Stable blood pressure -Monitoring --Given poor PO intake, will hold medications.  -hold Amlodipine   Microcytic Anemia  HgB is 9.7, >>> 8.8  -improved from recent hospitalization with value of 7.5. MCV low.  -continue Fe supplement   Alcohol Dependence  Unclear if he still using alcohol-  has been at ALF.  -monitor closely, start CIWA as needed  -continue MVI, thiamine, folic acid   Protein-Calorie Malnutrition  -consult nutritionist   Hypokalemia --Monitoring closely, anticipating improvement with IVF LR   Nutritional status:         Consultants: Nephrology PCCM   ---------------------------------------------------------------------------------------------------------------------------------------------  DVT prophylaxis:  SCD/Compression stockings and Lovenox SQ Code Status:   Code Status: DNR Family Communication:  No family member present at bedside-  Called Niece at [phone] -- updated  Discussed in detail including goal of care and poor prognosis--  She expressed understanding, agreeable to palliative care consultation.    Admission status:    Status is: Inpatient  Remains inpatient appropriate because:Inpatient level of care appropriate due to severity of illness   Dispo: The patient is from: SNF              Anticipated d/c is to: SNF              Anticipated d/c date is: 3 days              Patient  currently is not medically stable to d/c.        Procedures:   No admission procedures for hospital encounter.     Antimicrobials:  Anti-infectives (From admission, onward)   None       Medication:  . enoxaparin (LOVENOX) injection  40 mg Subcutaneous Q24H  . ferrous sulfate  325 mg Oral TID WC  . folic acid  1 mg Oral Daily  . multivitamin with minerals  1 tablet Oral Daily  . Nepro  1 Bottle Oral TID WC  . tamsulosin  0.4 mg Oral QPC supper  . thiamine  100 mg Oral Daily       Objective:   Vitals:   04/08/20 1000 04/08/20 1030 04/08/20 1100 04/08/20 1130  BP: (!) 133/57 134/65 135/64 (!) 123/59  Pulse: 77 70 83 82  Resp: 14 16 15 17   Temp:      TempSrc:      SpO2: 97% 94% 93% 96%  Weight:      Height:        Intake/Output Summary (Last 24 hours) at 04/08/2020 1212 Last data filed at 04/08/2020 [id number] Gross per 24 hour  Intake 2670.89 ml  Output --  Net 2670.89 ml   Filed Weights   04/06/20 1456  Weight: 63.5 kg     Examination:        Physical Exam:   General:   Awake, lethargic --minimal verbal conversation,  HEENT:  Normocephalic, PERRL, otherwise with in Normal limits   Neuro:  CNII-XII intact. , normal motor and sensation, reflexes intact   Lungs:   Clear to auscultation BL, Respirations unlabored, no wheezes / crackles  Cardio:    S1/S2, RRR, No murmure, No Rubs or Gallops   Abdomen:   Soft, non-tender, bowel sounds active all four quadrants,  no guarding or peritoneal signs.  Muscular skeletal:   Dense weakness on the left lower leg  Limited exam - in bed, able to move all 3 extremities, Normal strength,  2+ pulses,  symmetric, No pitting edema  Skin:  Dry, warm to touch, negative for any Rashes, No open wounds  Wounds: Please see nursing documentation Pressure Injury 11/14/19 Hip Right Stage 2 -  Partial thickness loss of dermis presenting as a shallow open injury with a red, pink wound bed without slough. open wound (Active)   11/14/19 1110  Location: Hip  Location Orientation: Right  Staging: Stage 2 -  Partial thickness loss of dermis presenting as a shallow open injury with a red, pink wound bed without slough.  Wound Description (Comments): open wound  Present on Admission:      Pressure Injury 11/14/19 Thigh Posterior;Proximal Stage 1 -  Intact skin with non-blanchable redness of a localized area usually over a bony prominence. (Active)  11/14/19 1114  Location: Thigh  Location Orientation: Posterior;Proximal  Staging: Stage  1 -  Intact skin with non-blanchable redness of a localized area usually over a bony prominence.  Wound Description (Comments):   Present on Admission:      Pressure Injury 03/24/20 Buttocks Right;Left;Mid Unstageable - Full thickness tissue loss in which the base of the injury is covered by slough (yellow, tan, Sauve, green or brown) and/or eschar (tan, brown or black) in the wound bed. Pt has two pressure i (Active)  03/24/20 1200  Location: Buttocks  Location Orientation: Right;Left;Mid  Staging: Unstageable - Full thickness tissue loss in which the base of the injury is covered by slough (yellow, tan, Zheng, green or brown) and/or eschar (tan, brown or black) in the wound bed.  Wound Description (Comments): Pt has two pressure injuries on sacrum. Right sided pressure injury is unstageable   Present on Admission: Yes     Pressure Injury 03/24/20 Sacrum Medial Stage 2 -  Partial thickness loss of dermis presenting as a shallow open injury with a red, pink wound bed without slough. (Active)  03/24/20 2230  Location: Sacrum  Location Orientation: Medial  Staging: Stage 2 -  Partial thickness loss of dermis presenting as a shallow open injury with a red, pink wound bed without slough.  Wound Description (Comments):   Present on Admission: Yes     Pressure Injury 03/24/20 Buttocks Right Stage 2 -  Partial thickness loss of dermis presenting as a shallow open injury with a red, pink  wound bed without slough. (Active)  03/24/20 2230  Location: Buttocks  Location Orientation: Right  Staging: Stage 2 -  Partial thickness loss of dermis presenting as a shallow open injury with a red, pink wound bed without slough.  Wound Description (Comments):   Present on Admission: Yes     Pressure Injury 03/24/20 Hip Right Stage 2 -  Partial thickness loss of dermis presenting as a shallow open injury with a red, pink wound bed without slough. (Active)  03/24/20 2230  Location: Hip  Location Orientation: Right  Staging: Stage 2 -  Partial thickness loss of dermis presenting as a shallow open injury with a red, pink wound bed without slough.  Wound Description (Comments):   Present on Admission: Yes          ------------------------------------------------------------------------------------------------------------------------------------------    LABs:  CBC Latest Ref Rng & Units 04/08/2020 04/07/2020 04/06/2020  WBC 4.0 - 10.5 K/uL 7.8 11.4(H) 10.6(H)  Hemoglobin 13.0 - 17.0 g/dL 8.1(O) 1.7(P) 8.9(L)  Hematocrit 39 - 52 % 23.9(L) 24.3(L) 25.6(L)  Platelets 150 - 400 K/uL 355 359 351   CMP Latest Ref Rng & Units 04/08/2020 04/08/2020 04/08/2020  Glucose 70 - 99 mg/dL 102(H) 852(D) 782(U)  BUN 8 - 23 mg/dL <2(P) <5(T) <6(R)  Creatinine 0.61 - 1.24 mg/dL 4.43(X) 5.40(G) 8.67(Y)  Sodium 135 - 145 mmol/L 119(LL) 121(L) 119(LL)  Potassium 3.5 - 5.1 mmol/L 3.2(L) 3.8 3.8  Chloride 98 - 111 mmol/L 92(L) 94(L) 93(L)  CO2 22 - 32 mmol/L 17(L) 17(L) 14(L)  Calcium 8.9 - 10.3 mg/dL 7.7(L) 7.8(L) 7.7(L)  Total Protein 6.5 - 8.1 g/dL - - -  Total Bilirubin 0.3 - 1.2 mg/dL - - -  Alkaline Phos 38 - 126 U/L - - -  AST 15 - 41 U/L - - -  ALT 0 - 44 U/L - - -       Micro Results Recent Results (from the past 240 hour(s))  SARS Coronavirus 2 by RT PCR (hospital order, performed in [facility] hospital lab) Nasopharyngeal  Nasopharyngeal Swab     Status: None   Collection Time:  04/06/20  4:00 PM   Specimen: Nasopharyngeal Swab  Result Value Ref Range Status   SARS Coronavirus 2 NEGATIVE NEGATIVE Final    Comment: (NOTE) SARS-CoV-2 target nucleic acids are NOT DETECTED.  The SARS-CoV-2 RNA is generally detectable in upper and lower respiratory specimens during the acute phase of infection. The lowest concentration of SARS-CoV-2 viral copies this assay can detect is 250 copies / mL. A negative result does not preclude SARS-CoV-2 infection and should not be used as the sole basis for treatment or other patient management decisions.  A negative result may occur with improper specimen collection / handling, submission of specimen other than nasopharyngeal swab, presence of viral mutation(s) within the areas targeted by this assay, and inadequate number of viral copies (<250 copies / mL). A negative result must be combined with clinical observations, patient history, and epidemiological information.  Fact Sheet for Patients:   BoilerBrush.com.cy  Fact Sheet for Healthcare Providers: https://pope.com/  This test is not yet approved or  cleared by the [place] FDA and has been authorized for detection and/or diagnosis of SARS-CoV-2 by FDA under an Emergency Use Authorization (EUA).  This EUA will remain in effect (meaning this test can be used) for the duration of the COVID-19 declaration under Section 564(b)(1) of the Act, 21 U.S.C. section 360bbb-3(b)(1), unless the authorization is terminated or revoked sooner.  Performed at [facility], [address], [place], [place] [address]     Radiology Reports DG Chest 2 View  Result Date: 03/24/2020 CLINICAL DATA:  Altered mental status. EXAM: CHEST - 2 VIEW COMPARISON:  12/15/2019. FINDINGS: Mediastinum hilar structures normal. Stable cardiomegaly. No pulmonary venous congestion. No focal infiltrate. No pleural effusion or pneumothorax. Degenerative  changes scoliosis thoracic spine. Degenerative changes both shoulders. Stable sclerotic changes right proximal humerus most likely secondary to infarct or enchondroma. IMPRESSION: Stable cardiomegaly. No acute cardiopulmonary disease. Previously identified pulmonary venous congestion has cleared. Electronically Signed   By: Maisie Fus  Register   On: 03/24/2020 10:39   CT Head Wo Contrast  Result Date: 04/06/2020 CLINICAL DATA:  Dysphagia. EXAM: CT HEAD WITHOUT CONTRAST TECHNIQUE: Contiguous axial images were obtained from the base of the skull through the vertex without intravenous contrast. COMPARISON:  March 24, 2020 FINDINGS: Brain: There is mild to moderate severity cerebral atrophy with widening of the extra-axial spaces and ventricular dilatation. There are areas of decreased attenuation within the white matter tracts of the supratentorial brain, consistent with microvascular disease changes. 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 Vascular: No hyperdense vessel or unexpected calcification. Skull: Normal. Negative for fracture or focal lesion. Sinuses/Orbits: No acute finding. Other: None. IMPRESSION: 1. Generalized cerebral atrophy. 2. No acute intracranial abnormality. Electronically Signed   By: Aram Candela M.D.   On: 04/06/2020 15:58   CT Head Wo Contrast  Result Date: 03/24/2020 CLINICAL DATA:  Weakness, altered mental status EXAM: CT HEAD WITHOUT CONTRAST TECHNIQUE: Contiguous axial images were obtained from the base of the skull through the vertex without intravenous contrast. COMPARISON:  12/06/2019 FINDINGS: Brain: No evidence of acute infarction, hemorrhage, hydrocephalus, extra-axial collection or mass lesion/mass effect. 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 Moderate low-density changes within the periventricular and subcortical white matter compatible with chronic microvascular ischemic change. Moderate diffuse cerebral volume loss. Vascular:  Atherosclerotic calcifications involving the large vessels of the skull base. No unexpected hyperdense vessel. Skull: Normal. Negative for  fracture or focal lesion. Sinuses/Orbits: No acute finding. Other: None. IMPRESSION: 1. No acute intracranial findings. 2. Chronic microvascular ischemic change and cerebral volume loss. Electronically Signed   By: Duanne Guess D.O.   On: 03/24/2020 13:55   CT ABDOMEN PELVIS W CONTRAST  Result Date: 03/24/2020 CLINICAL DATA:  Buttock wound. EXAM: CT ABDOMEN AND PELVIS WITH CONTRAST TECHNIQUE: Multidetector CT imaging of the abdomen and pelvis was performed using the standard protocol following bolus administration of intravenous contrast. CONTRAST:  45mL OMNIPAQUE IOHEXOL 300 MG/ML  SOLN COMPARISON:  Abdominal ultrasound 12/07/2019 FINDINGS: The study is mildly motion degraded. Lower chest: Minimal atelectasis or scarring in the lung bases. No pleural effusion. Three-vessel coronary atherosclerosis. Hepatobiliary: No focal liver abnormality is seen. Unremarkable gallbladder. No biliary dilatation. Pancreas: Unremarkable. Spleen: Unremarkable. Adrenals/Urinary Tract: Unremarkable adrenal glands. No evidence of renal mass or hydronephrosis. Punctate nonobstructing calculus or vascular calcification in the right renal hilum. Unremarkable bladder. Stomach/Bowel: The stomach is unremarkable. There is a moderate amount of stool in the rectum with a small amount of scattered stool in the colon. There is no evidence of bowel obstruction. Evaluation for bowel inflammation is limited by motion, paucity of abdominal fat, and absence of oral contrast. Unremarkable appendix. Vascular/Lymphatic: Abdominal aortic atherosclerosis without aneurysm. Heavily calcified plaque at the celiac, superior mesenteric, and bilateral renal artery origins with potentially flow limiting stenoses. No enlarged lymph nodes. Reproductive: Mildly enlarged prostate. Other: No ascites or pneumoperitoneum.  Right buttock soft tissue ulceration inferior to the ischium with surrounding soft tissue thickening and stranding extending into the included portion of the right thigh and perineum. No fluid collection, dissecting subcutaneous emphysema, or regional osseous destruction identified. 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 IMPRESSION: 1. Inferior right buttock ulceration with surrounding inflammation extending into the included portion of the right thigh and perineum. No abscess. No evidence of necrotizing fasciitis or osteomyelitis. 2. Moderate rectal stool burden. 3. Aortic Atherosclerosis (ICD10-I70.0). Electronically Signed   By: Sebastian Ache M.D.   On: 03/24/2020 14:00   DG Chest Portable 1 View  Result Date: 04/06/2020 CLINICAL DATA:  Cough. EXAM: PORTABLE CHEST 1 VIEW COMPARISON:  March 24, 2020 FINDINGS: The lungs are mildly hyperinflated. Mild diffuse chronic appearing increased lung markings are seen. There is no evidence of acute infiltrate, pleural effusion or pneumothorax. The heart size and mediastinal contours are within normal limits. The visualized skeletal structures are unremarkable. IMPRESSION: Chronic appearing increased lung markings without evidence of acute or active cardiopulmonary disease. Electronically Signed   By: Aram Candela M.D.   On: 04/06/2020 15:54    SIGNED: Kendell Bane, MD, FACP, FHM. [facility],  Pager (please use [email or web address] to page/text)  If 7PM-7AM, please contact night-coverage [email or web address] 04/08/2020, 12:12 PM   Critical care management, greater than 55 minutes-was spent evaluating seeing patient reviewing records placing order drawing plan of care with consultants, calling consultants.

## 2020-04-09 ENCOUNTER — Inpatient Hospital Stay: Payer: Medicare Other

## 2020-04-09 LAB — BASIC METABOLIC PANEL
Anion gap: 11 (ref 5–15)
Anion gap: 9 (ref 5–15)
Anion gap: 11 (ref 5–15)
Anion gap: 6 (ref 5–15)
Anion gap: 8 (ref 5–15)
BUN: <5 mg/dL — ABNORMAL LOW (ref 8–23)
BUN: <5 mg/dL — ABNORMAL LOW (ref 8–23)
BUN: <5 mg/dL — ABNORMAL LOW (ref 8–23)
BUN: <5 mg/dL — ABNORMAL LOW (ref 8–23)
BUN: <5 mg/dL — ABNORMAL LOW (ref 8–23)
CO2: 18 mmol/L — ABNORMAL LOW (ref 22–32)
CO2: 19 mmol/L — ABNORMAL LOW (ref 22–32)
CO2: 18 mmol/L — ABNORMAL LOW (ref 22–32)
CO2: 19 mmol/L — ABNORMAL LOW (ref 22–32)
CO2: 20 mmol/L — ABNORMAL LOW (ref 22–32)
Calcium: 8.1 mg/dL — ABNORMAL LOW (ref 8.9–10.3)
Calcium: 7.8 mg/dL — ABNORMAL LOW (ref 8.9–10.3)
Calcium: 8.1 mg/dL — ABNORMAL LOW (ref 8.9–10.3)
Calcium: 7.9 mg/dL — ABNORMAL LOW (ref 8.9–10.3)
Calcium: 7.8 mg/dL — ABNORMAL LOW (ref 8.9–10.3)
Chloride: 93 mmol/L — ABNORMAL LOW (ref 98–111)
Chloride: 95 mmol/L — ABNORMAL LOW (ref 98–111)
Chloride: 94 mmol/L — ABNORMAL LOW (ref 98–111)
Chloride: 96 mmol/L — ABNORMAL LOW (ref 98–111)
Chloride: 95 mmol/L — ABNORMAL LOW (ref 98–111)
Creatinine, Ser: 0.36 mg/dL — ABNORMAL LOW (ref 0.61–1.24)
Creatinine, Ser: <0.3 mg/dL — ABNORMAL LOW (ref 0.61–1.24)
Creatinine, Ser: 0.42 mg/dL — ABNORMAL LOW (ref 0.61–1.24)
Creatinine, Ser: <0.3 mg/dL — ABNORMAL LOW (ref 0.61–1.24)
Creatinine, Ser: <0.3 mg/dL — ABNORMAL LOW (ref 0.61–1.24)
GFR calc Af Amer: >60 mL/min (ref >60)
GFR calc Af Amer: >60 mL/min (ref >60)
GFR calc non Af Amer: >60 mL/min (ref >60)
GFR calc non Af Amer: >60 mL/min (ref >60)
Glucose, Bld: 85 mg/dL (ref 70–99)
Glucose, Bld: 75 mg/dL (ref 70–99)
Glucose, Bld: 83 mg/dL (ref 70–99)
Glucose, Bld: 87 mg/dL (ref 70–99)
Glucose, Bld: 96 mg/dL (ref 70–99)
Potassium: 3.1 mmol/L — ABNORMAL LOW (ref 3.5–5.1)
Potassium: 3.5 mmol/L (ref 3.5–5.1)
Potassium: 3.6 mmol/L (ref 3.5–5.1)
Potassium: 3.7 mmol/L (ref 3.5–5.1)
Potassium: 3.7 mmol/L (ref 3.5–5.1)
Sodium: 122 mmol/L — ABNORMAL LOW (ref 135–145)
Sodium: 123 mmol/L — ABNORMAL LOW (ref 135–145)
Sodium: 123 mmol/L — ABNORMAL LOW (ref 135–145)
Sodium: 121 mmol/L — ABNORMAL LOW (ref 135–145)
Sodium: 123 mmol/L — ABNORMAL LOW (ref 135–145)

## 2020-04-09 LAB — SODIUM: Sodium: 123 mmol/L — ABNORMAL LOW (ref 135–145)

## 2020-04-09 LAB — CBC
HCT: 24.4 % — ABNORMAL LOW (ref 39.0–52.0)
Hemoglobin: 8.7 g/dL — ABNORMAL LOW (ref 13.0–17.0)
MCH: 27.4 pg (ref 26.0–34.0)
MCHC: 35.7 g/dL (ref 30.0–36.0)
MCV: 77 fL — ABNORMAL LOW (ref 80.0–100.0)
Platelets: 283 10*3/uL (ref 150–400)
RBC: 3.17 MIL/uL — ABNORMAL LOW (ref 4.22–5.81)
RDW: 17.3 % — ABNORMAL HIGH (ref 11.5–15.5)
WBC: 6.5 10*3/uL (ref 4.0–10.5)
nRBC: 0 % (ref 0.0–0.2)

## 2020-04-09 LAB — MAGNESIUM: Magnesium: 1.5 mg/dL — ABNORMAL LOW (ref 1.7–2.4)

## 2020-04-09 LAB — PHOSPHORUS: Phosphorus: 2 mg/dL — ABNORMAL LOW (ref 2.5–4.6)

## 2020-04-09 LAB — GLUCOSE, CAPILLARY
Glucose-Capillary: 82 mg/dL (ref 70–99)
Glucose-Capillary: 92 mg/dL (ref 70–99)

## 2020-04-09 MED ORDER — MAGNESIUM SULFATE 2 GM/50ML IV SOLN
2.0000 g | Freq: Once | INTRAVENOUS | Status: AC
  Administered 2020-04-09: 2 g via INTRAVENOUS
  Filled 2020-04-09: qty 50

## 2020-04-09 MED ORDER — DEXTROSE 10 % IV SOLN: INTRAVENOUS | Status: DC

## 2020-04-09 MED ORDER — POTASSIUM PHOSPHATES 15 MMOLE/5ML IV SOLN
15.0000 mmol | Freq: Once | INTRAVENOUS | Status: AC
  Administered 2020-04-09: 15 mmol via INTRAVENOUS
  Filled 2020-04-09: qty 5

## 2020-04-09 MED ORDER — OSMOLITE 1.2 CAL PO LIQD
1000.0000 mL | ORAL | Status: DC
  Administered 2020-04-09: 1000 mL

## 2020-04-09 MED ORDER — FERROUS SULFATE 220 (44 FE) MG/5ML PO ELIX: 220.0000 mg | ORAL_SOLUTION | Freq: Three times a day (TID) | ORAL | Status: DC

## 2020-04-09 MED ORDER — POTASSIUM CHLORIDE 10 MEQ/100ML IV SOLN
10.0000 meq | INTRAVENOUS | Status: AC
  Administered 2020-04-09 (×4): 10 meq via INTRAVENOUS
  Filled 2020-04-09 (×4): qty 100

## 2020-04-09 MED ORDER — POTASSIUM CHLORIDE 10 MEQ/100ML IV SOLN
10.0000 meq | INTRAVENOUS | Status: AC
  Administered 2020-04-09 (×2): 10 meq via INTRAVENOUS
  Filled 2020-04-09 (×2): qty 100

## 2020-04-09 MED ORDER — FERROUS SULFATE 220 (44 FE) MG/5ML PO ELIX
325.0000 mg | ORAL_SOLUTION | Freq: Three times a day (TID) | ORAL | Status: DC
  Administered 2020-04-09 (×2): 325 mg
  Filled 2020-04-09: qty 7.39
  Filled 2020-04-09 (×2): qty 7.4
  Filled 2020-04-09: qty 7.39
  Filled 2020-04-09: qty 7.4

## 2020-04-09 MED ORDER — DEXTROSE IN LACTATED RINGERS 5 % IV SOLN: INTRAVENOUS | Status: DC

## 2020-04-09 MED ORDER — SODIUM CHLORIDE 3 % IV SOLN
30.0000 mL/h | INTRAVENOUS | Status: DC
  Filled 2020-04-09 (×6): qty 500

## 2020-04-09 NOTE — Progress Notes (Signed)
PHARMACY CONSULT NOTE  Pharmacy Consult for Electrolyte Monitoring and Replacement   Recent Labs: Potassium (mmol/L)  Date Value  04/09/2020 3.1 (L)   Magnesium (mg/dL)  Date Value  96/11/5407 1.9   Calcium (mg/dL)  Date Value  81/19/1478 8.1 (L)   Albumin (g/dL)  Date Value  29/56/2130 3.0 (L)   Phosphorus (mg/dL)  Date Value  86/57/8469 2.5   Sodium (mmol/L)  Date Value  04/09/2020 122 (L)   Assessment:  74 y.o. male with PMH of chronic alcohol use, HTN, recent admission for sepsis and malnutrition who presents from ALF apparently for difficulty swallowing. Patient is responsive on exam but non-verbal; apparently mental status is at baseline per report. Per report from facility  Na 108, pt started on 3% NaCl at 70ml/hr 0820 0154 Na 109, continue current plan  0820 0402 Na 112, continue current plan 0820 0624 Na 114, 3% NaCl decreased to 20 ml/hr 0820 0810 Na 112, continue current plan 0820 1008 Na 115, continue current plan 0820 1130 MD added D5NS at 125 mL/hr 0820 1254 Na 113, continue current plan 0820 1454 Na 113, continue current plan 0820 1711 Na 115, continue current rate 0820 2059 Na 117, continue current rate 0821 0213 Na 118, continue current rate  0821 0534 Na 120,  3% saline was ordered x 17 hrs, order has expired 0821 1038 Na 119, 3% saline drip restarted at 0923. Per nephrology note will increase drip to 30 ml/hr 0821 1251 Na 115  Rate changed to 30 ml/hr at 1351, continue current rate 0821 1543 Na 121 >> messaged nephrology (115 to 121?). Will change rate to 25 ml/hr.  8/21 1847 Na 124 > NaCl was continued at 30ml/hr 8/21 2222 Na 120 > trending down and NaCl still infusing at 56ml/hr.  D/W Dr Thedore Mins and will increase rate to 69ml/hr and continue to monitor 8/22 0152 Na 122, continue current rate (36ml/hr), K 3.1, will order KCL IV q1h x 4 bags ( total)  Goal of Therapy:  Recommended rate of correction = no greater than 0.5 mmol/L/hr and  no greater than 12 mmol/L/day  Plan:  -- Continue 3% NaCl drip to 30 mL/hr. (Per discussion with Nephrology). -- KCL IV q1hr x 4 bags ( ) -- Continue Na checks q4h while on hypertonic saline -- F/U K+ 1 hour after last bag infused  Valrie Hart A 04/09/2020 3:05 AM

## 2020-04-09 NOTE — Progress Notes (Signed)
PROGRESS NOTE    Patient: Javier Robinson                            PCP: Charlott Rakes, MD                    DOB: August 29, 1945            DOA: 04/06/2020 ZOX:[id number]             DOS: 04/09/2020, 11:58 AM   LOS: 3 days   Date of Service: The patient was seen and examined on 04/09/2020  Subjective:   The patient was seen and examined this morning, was sleeping comfortably was able to be awakened can only state his name. Per nursing staff no issues overnight. His hypertonic solution was held briefly He had a hypoglycemic episode--amp of D50 was given blood sugar has improved  IV fluids were changed and adjusted accordingly  Brief Narrative:   HPI: Javier Robinson 74 yo M with PMHx  chronic alcohol use, HTN, recent admission for sepsis and malnutrition who presents from ALF apparently for difficulty swallowing,  generalized weakness, confusion, difficulty . Reviewed records, pt was just hospitalized for sepsis, d/c to SNF. No apparent focal deficits on exam. CT Head shows NAICA. CXR w/o PNA or apparent major aspiration.  Apparently mental status is at baseline per report. Per report from facility [facility])  Labs as above. BMP with profound hyponatremia ( Na 110) , acidemia, hypoglycemia. Suspect related to poor PO intake. NS at 100.   Assessment & Plan:   Active Problems:   Hypoglycemia   Hyponatremia   Alcohol dependence (HCC)   Essential hypertension   Protein-calorie malnutrition, severe   Microcytic anemia  Hyponatremia and Hypochloremia with Non Anion Gap Metabolic Acidosis  Sodium continue to improve slowly  -Multifactorial including poor p.o. intake, dehydration --- ?  SIADH  -Appreciating nephrology assist, -Sodium on admission 108, 109, 112, 114, 112, 115 >> 119 >>> 122, 123 now -Patient was started on hypertonic normal saline overnight--- continue to titrate, briefly held -Supplement IV fluid -LR to be continued along with D10 to avoid hypoglycemia   On  admission Na noted to be profoundly low at 110. Cl  79. Bicarb 17. Patient appears hypovolemic on admission. Does appear that he sometimes has chronic mild hyponatremia in the low 130s, likely compounded by alcohol use. Not on thiazide diuretic or other offending agents. CT head was negative. Appears to be at baseline for mental status.   -urine electrolytes and osmolality studies pending    Metabolic encephalopathy  -Baseline cognition and interaction not clear -Patient has had a recent stroke -Verbal communication interaction and insight is compromised due to poor hydration, stroke, hyponatremia We will continue to monitor very closely -Continue neurochecks -Avoiding correcting sodium level rapidly   dehydration/hypoglycemia  -Continue LR, added D10 IV fluid to avoid hypoglycemia -continue hydrate CBGs: 216, 195, 158 >> once again this a.m. as low as 43 >> D50 was given  Blood sugar is improved, D10 NS was initiated on admission glucose 49. uspect related to poor PO intake.  -Checking CBG every 4 hours  Severe malnutrition-poor p.o. intake/dysphagia (in the setting of previous stroke) -Speech will be consulted for evaluation -But at this time patient remains encephalopathic -With attempt of NG tube placement patient become agitated --nursing staff was not able to place the NG tube -We will wait till encephalopathy improves before reevaluation -Family  has been made aware of poor p.o. intake, prognosis with comorbidities remains very poor  HTN /hypotension -Monitoring blood pressure currently stable -Monitoring --Given poor PO intake, will hold medications.  -hold Amlodipine   Microcytic Anemia  HgB is 9.7, >>> 8.8  >> 8.7 -improved from recent hospitalization with value of 7.5. MCV low.  -continue Fe supplement  -Monitoring remained stable  Alcohol Dependence  -No signs of withdrawal Unclear if he still using alcohol-  has been at ALF.  -monitor closely, start CIWA as  needed  -continue MVI, thiamine, folic acid    Hypokalemia --Monitoring closely, anticipating improvement with IVF LR  Ethics -DNR/DNI confirmed - due to multiple comorbidities, continued metabolic encephalopathy poor p.o. intake, severe hyponatremia malnutrition, recent stroke --- prognosis remain grim Family was updated was informed of the findings-agreed with current plan Palliative care consulted   Nutritional status:         Consultants: Nephrology PCCM Palliative care consulted   ---------------------------------------------------------------------------------------------------------------------------------------------  DVT prophylaxis:  SCD/Compression stockings and Lovenox SQ Code Status:   Code Status: DNR Family Communication:  No family member present at bedside- Called Niece at [phone] -- updated  Discussed in detail including goal of care and poor prognosis--  She expressed understanding, agreeable to palliative care consultation.    Admission status:    Status is: Inpatient  Remains inpatient appropriate because:Inpatient level of care appropriate due to severity of illness   Dispo: The patient is from: SNF              Anticipated d/c is to: SNF              Anticipated d/c date is: 3 days              Patient currently is not medically stable to d/c.        Procedures:   No admission procedures for hospital encounter.     Antimicrobials:  Anti-infectives (From admission, onward)   None       Medication:  . Chlorhexidine Gluconate Cloth  6 each Topical Daily  . enoxaparin (LOVENOX) injection  40 mg Subcutaneous Q24H  . ferrous sulfate  325 mg Oral TID WC  . folic acid  1 mg Oral Daily  . multivitamin with minerals  1 tablet Oral Daily  . Nepro  1 Bottle Oral TID WC  . tamsulosin  0.4 mg Oral QPC supper  . thiamine  100 mg Oral Daily       Objective:   Vitals:   04/09/20 0700 04/09/20 0800 04/09/20 0900 04/09/20  1000  BP: 128/69 132/68 (!) 142/61 130/66  Pulse: 83 83 80 82  Resp: Temp:  98.2 F (36.8 C)    TempSrc:  Oral    SpO2: 98% 100% 99% 96%  Weight:      Height:        Intake/Output Summary (Last 24 hours) at 04/09/2020 1158 Last data filed at 04/09/2020 1000 Gross per 24 hour  Intake 3108.14 ml  Output 300 ml  Net 2808.14 ml   Filed Weights   04/06/20 1456 04/08/20 1828  Weight: 63.5 kg 61 kg     Examination:         Physical Exam:   General:   Patient was awakened -was only able to state his name  HEENT:  Normocephalic, PERRL, otherwise with in Normal limits   Neuro:  CNII-XII intact. , normal motor and sensation, reflexes intact   Lungs:  Clear to auscultation BL, Respirations unlabored, no wheezes / crackles  Cardio:    S1/S2, RRR, No murmure, No Rubs or Gallops   Abdomen:   Soft, non-tender, bowel sounds active all four quadrants,  no guarding or peritoneal signs.  Muscular skeletal:   Severe generalized weaknesses, Limited exam - in bed, able to move all 3 extremities, ,  2+ pulses,  symmetric, No pitting edema  Skin:  Dry, warm to touch, negative for any Rashes, No open wounds  Wounds: Please see nursing documentation Pressure Injury 11/14/19 Hip Right Stage 2 -  Partial thickness loss of dermis presenting as a shallow open injury with a red, pink wound bed without slough. open wound (Active)  11/14/19 1110  Location: Hip  Location Orientation: Right  Staging: Stage 2 -  Partial thickness loss of dermis presenting as a shallow open injury with a red, pink wound bed without slough.  Wound Description (Comments): open wound  Present on Admission:      Pressure Injury 11/14/19 Thigh Posterior;Proximal Stage 1 -  Intact skin with non-blanchable redness of a localized area usually over a bony prominence. (Active)  11/14/19 1114  Location: Thigh  Location Orientation: Posterior;Proximal  Staging: Stage 1 -  Intact skin with non-blanchable redness of  a localized area usually over a bony prominence.  Wound Description (Comments):   Present on Admission:      Pressure Injury 03/24/20 Buttocks Right;Left;Mid Unstageable - Full thickness tissue loss in which the base of the injury is covered by slough (yellow, tan, [place], green or brown) and/or eschar (tan, brown or black) in the wound bed. Pt has two pressure i (Active)  03/24/20 1200  Location: Buttocks  Location Orientation: Right;Left;Mid  Staging: Unstageable - Full thickness tissue loss in which the base of the injury is covered by slough (yellow, tan, [place], green or brown) and/or eschar (tan, brown or black) in the wound bed.  Wound Description (Comments): Pt has two pressure injuries on sacrum. Right sided pressure injury is unstageable   Present on Admission: Yes     Pressure Injury 03/24/20 Sacrum Medial Stage 2 -  Partial thickness loss of dermis presenting as a shallow open injury with a red, pink wound bed without slough. (Active)  03/24/20 2230  Location: Sacrum  Location Orientation: Medial  Staging: Stage 2 -  Partial thickness loss of dermis presenting as a shallow open injury with a red, pink wound bed without slough.  Wound Description (Comments):   Present on Admission: Yes     Pressure Injury 03/24/20 Buttocks Right Stage 2 -  Partial thickness loss of dermis presenting as a shallow open injury with a red, pink wound bed without slough. (Active)  03/24/20 2230  Location: Buttocks  Location Orientation: Right  Staging: Stage 2 -  Partial thickness loss of dermis presenting as a shallow open injury with a red, pink wound bed without slough.  Wound Description (Comments):   Present on Admission: Yes     Pressure Injury 03/24/20 Hip Right Stage 2 -  Partial thickness loss of dermis presenting as a shallow open injury with a red, pink wound bed without slough. (Active)  03/24/20 2230  Location: Hip  Location Orientation: Right  Staging: Stage 2 -  Partial thickness loss  of dermis presenting as a shallow open injury with a red, pink wound bed without slough.  Wound Description (Comments):   Present on Admission: Yes             ------------------------------------------------------------------------------------------------------------------------------------------  LABs:  CBC Latest Ref Rng & Units 04/09/2020 04/08/2020 04/07/2020  WBC 4.0 - 10.5 K/uL 6.5 7.8 11.4(H)  Hemoglobin 13.0 - 17.0 g/dL 6.5(H8.7(L) 8.4(O8.6(L) 9.6(E8.8(L)  Hematocrit 39 - 52 % 24.4(L) 23.9(L) 24.3(L)  Platelets 150 - 400 K/uL 283 355 359   CMP Latest Ref Rng & Units 04/09/2020 04/09/2020 04/08/2020  Glucose 70 - 99 mg/dL 75 85 952(W102(H)  BUN 8 - 23 mg/dL <4(X<5(L) <3(K<5(L) <4(M<5(L)  Creatinine 0.61 - 1.24 mg/dL <0.10(U<0.30(L) 7.25(D0.36(L) <6.64(Q<0.30(L)  Sodium 135 - 145 mmol/L 123(L) 122(L) 120(L)  Potassium 3.5 - 5.1 mmol/L 3.5 3.1(L) 3.2(L)  Chloride 98 - 111 mmol/L 95(L) 93(L) 92(L)  CO2 22 - 32 mmol/L 19(L) 18(L) 20(L)  Calcium 8.9 - 10.3 mg/dL 7.8(L) 8.1(L) 7.9(L)  Total Protein 6.5 - 8.1 g/dL - - -  Total Bilirubin 0.3 - 1.2 mg/dL - - -  Alkaline Phos 38 - 126 U/L - - -  AST 15 - 41 U/L - - -  ALT 0 - 44 U/L - - -       Micro Results Recent Results (from the past 240 hour(s))  SARS Coronavirus 2 by RT PCR (hospital order, [facility]) Nasopharyngeal Nasopharyngeal Swab     Status: None   Collection Time: 04/06/20  4:00 PM   Specimen: Nasopharyngeal Swab  Result Value Ref Range Status   SARS Coronavirus 2 NEGATIVE NEGATIVE Final    Comment: (NOTE) SARS-CoV-2 target nucleic acids are NOT DETECTED.  The SARS-CoV-2 RNA is generally detectable in upper and lower respiratory specimens during the acute phase of infection. The lowest concentration of SARS-CoV-2 viral copies this assay can detect is 250 copies / mL. A negative result does not preclude SARS-CoV-2 infection and should not be used as the sole basis for treatment or other patient management decisions.  A negative  result may occur with improper specimen collection / handling, submission of specimen other than nasopharyngeal swab, presence of viral mutation(s) within the areas targeted by this assay, and inadequate number of viral copies (<250 copies / mL). A negative result must be combined with clinical observations, patient history, and epidemiological information.  Fact Sheet for Patients:   BoilerBrush.com.cyhttps://www.fda.gov/media/136312/download  Fact Sheet for Healthcare Providers: https://pope.com/https://www.fda.gov/media/136313/download  This test is not yet approved or  cleared by the [place] FDA and has been authorized for detection and/or diagnosis of SARS-CoV-2 by FDA under an Emergency Use Authorization (EUA).  This EUA will remain in effect (meaning this test can be used) for the duration of the COVID-19 declaration under Section 564(b)(1) of the Act, 21 U.S.C. section 360bbb-3(b)(1), unless the authorization is terminated or revoked sooner.  Performed at [facility], [address] [place], [place] [address]   MRSA PCR Screening     Status: None   Collection Time: 04/08/20  6:30 PM   Specimen: Nasopharyngeal  Result Value Ref Range Status   MRSA by PCR NEGATIVE NEGATIVE Final    Comment:        The GeneXpert MRSA Assay (FDA approved for NASAL specimens only), is one component of a comprehensive MRSA colonization surveillance program. It is not intended to diagnose MRSA infection nor to guide or monitor treatment for MRSA infections. Performed at [facility], [address] [place], [place] [address]     Radiology Reports DG Chest 2 View  Result Date: 03/24/2020 CLINICAL DATA:  Altered mental status. EXAM: CHEST - 2 VIEW COMPARISON:  12/15/2019. FINDINGS: Mediastinum hilar structures normal. Stable  cardiomegaly. No pulmonary venous congestion. No focal infiltrate. No pleural effusion or pneumothorax. Degenerative changes scoliosis thoracic spine. Degenerative changes  both shoulders. Stable sclerotic changes right proximal humerus most likely secondary to infarct or enchondroma. IMPRESSION: Stable cardiomegaly. No acute cardiopulmonary disease. Previously identified pulmonary venous congestion has cleared. Electronically Signed   By: Maisie Fus  Register   On: 03/24/2020 10:39   CT Head Wo Contrast  Result Date: 04/06/2020 CLINICAL DATA:  Dysphagia. EXAM: CT HEAD WITHOUT CONTRAST TECHNIQUE: Contiguous axial images were obtained from the base of the skull through the vertex without intravenous contrast. COMPARISON:  March 24, 2020 FINDINGS: Brain: There is mild to moderate severity cerebral atrophy with widening of the extra-axial spaces and ventricular dilatation. There are areas of decreased attenuation within the white matter tracts of the supratentorial brain, consistent with microvascular disease changes. 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 Vascular: No hyperdense vessel or unexpected calcification. Skull: Normal. Negative for fracture or focal lesion. Sinuses/Orbits: No acute finding. Other: None. IMPRESSION: 1. Generalized cerebral atrophy. 2. No acute intracranial abnormality. Electronically Signed   By: Aram Candela M.D.   On: 04/06/2020 15:58   CT Head Wo Contrast  Result Date: 03/24/2020 CLINICAL DATA:  Weakness, altered mental status EXAM: CT HEAD WITHOUT CONTRAST TECHNIQUE: Contiguous axial images were obtained from the base of the skull through the vertex without intravenous contrast. COMPARISON:  12/06/2019 FINDINGS: Brain: No evidence of acute infarction, hemorrhage, hydrocephalus, extra-axial collection or mass lesion/mass effect. 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 Moderate low-density changes within the periventricular and subcortical white matter compatible with chronic microvascular ischemic change. Moderate diffuse cerebral volume loss. Vascular: Atherosclerotic calcifications involving the large vessels of  the skull base. No unexpected hyperdense vessel. Skull: Normal. Negative for fracture or focal lesion. Sinuses/Orbits: No acute finding. Other: None. IMPRESSION: 1. No acute intracranial findings. 2. Chronic microvascular ischemic change and cerebral volume loss. Electronically Signed   By: Duanne Guess D.O.   On: 03/24/2020 13:55   CT ABDOMEN PELVIS W CONTRAST  Result Date: 03/24/2020 CLINICAL DATA:  Buttock wound. EXAM: CT ABDOMEN AND PELVIS WITH CONTRAST TECHNIQUE: Multidetector CT imaging of the abdomen and pelvis was performed using the standard protocol following bolus administration of intravenous contrast. CONTRAST:  50mL OMNIPAQUE IOHEXOL 300 MG/ML  SOLN COMPARISON:  Abdominal ultrasound 12/07/2019 FINDINGS: The study is mildly motion degraded. Lower chest: Minimal atelectasis or scarring in the lung bases. No pleural effusion. Three-vessel coronary atherosclerosis. Hepatobiliary: No focal liver abnormality is seen. Unremarkable gallbladder. No biliary dilatation. Pancreas: Unremarkable. Spleen: Unremarkable. Adrenals/Urinary Tract: Unremarkable adrenal glands. No evidence of renal mass or hydronephrosis. Punctate nonobstructing calculus or vascular calcification in the right renal hilum. Unremarkable bladder. Stomach/Bowel: The stomach is unremarkable. There is a moderate amount of stool in the rectum with a small amount of scattered stool in the colon. There is no evidence of bowel obstruction. Evaluation for bowel inflammation is limited by motion, paucity of abdominal fat, and absence of oral contrast. Unremarkable appendix. Vascular/Lymphatic: Abdominal aortic atherosclerosis without aneurysm. Heavily calcified plaque at the celiac, superior mesenteric, and bilateral renal artery origins with potentially flow limiting stenoses. No enlarged lymph nodes. Reproductive: Mildly enlarged prostate. Other: No ascites or pneumoperitoneum. Right buttock soft tissue ulceration inferior to the ischium with  surrounding soft tissue thickening and stranding extending into the included portion of the right thigh and perineum. No fluid collection, dissecting subcutaneous emphysema, or regional osseous destruction identified. Musculoskeletal: Advanced disc degeneration at L4-5  and L5-S1. IMPRESSION: 1. Inferior right buttock ulceration with surrounding inflammation extending into the included portion of the right thigh and perineum. No abscess. No evidence of necrotizing fasciitis or osteomyelitis. 2. Moderate rectal stool burden. 3. Aortic Atherosclerosis (ICD10-I70.0). Electronically Signed   By: Sebastian Ache M.D.   On: 03/24/2020 14:00   DG Chest Portable 1 View  Result Date: 04/06/2020 CLINICAL DATA:  Cough. EXAM: PORTABLE CHEST 1 VIEW COMPARISON:  March 24, 2020 FINDINGS: The lungs are mildly hyperinflated. Mild diffuse chronic appearing increased lung markings are seen. There is no evidence of acute infiltrate, pleural effusion or pneumothorax. The heart size and mediastinal contours are within normal limits. The visualized skeletal structures are unremarkable. IMPRESSION: Chronic appearing increased lung markings without evidence of acute or active cardiopulmonary disease. Electronically Signed   By: Aram Candela M.D.   On: 04/06/2020 15:54   [place] EKG SITE RITE  Result Date: 04/08/2020 If Site Rite image not attached, placement could not be confirmed due to current cardiac rhythm.   SIGNED: Kendell Bane, MD, FACP, FHM. [facility],  Pager (please use [email or web address] to page/text)  If 7PM-7AM, please contact night-coverage [email or web address] 04/09/2020, 11:58 AM   Critical care management, greater than 55 minutes-was spent evaluating seeing patient reviewing records placing order drawing plan of care with consultants, calling consultants.

## 2020-04-09 NOTE — Progress Notes (Signed)
PHARMACY CONSULT NOTE  Pharmacy Consult for Electrolyte Monitoring and Replacement   Recent Labs: Potassium (mmol/L)  Date Value  04/09/2020 3.5   Magnesium (mg/dL)  Date Value  54/62/7035 1.9   Calcium (mg/dL)  Date Value  00/93/8182 7.8 (L)   Albumin (g/dL)  Date Value  99/37/1696 3.0 (L)   Phosphorus (mg/dL)  Date Value  78/93/8101 2.5   Sodium (mmol/L)  Date Value  04/09/2020 123 (L)   Assessment:  74 y.o. male with PMH of chronic alcohol use, HTN, recent admission for sepsis and malnutrition who presents from ALF apparently for difficulty swallowing. Patient is responsive on exam but non-verbal; apparently mental status is at baseline per report. Per report from facility  Na 108, pt started on 3% NaCl at 18ml/hr 0820 0154 Na 109, continue current plan  0820 0402 Na 112, continue current plan 0820 0624 Na 114, 3% NaCl decreased to 20 ml/hr 0820 0810 Na 112, continue current plan 0820 1008 Na 115, continue current plan 0820 1130 MD added D5NS at 125 mL/hr 0820 1254 Na 113, continue current plan 0820 1454 Na 113, continue current plan 0820 1711 Na 115, continue current rate 0820 2059 Na 117, continue current rate 0821 0213 Na 118, continue current rate  0821 0534 Na 120,  3% saline was ordered x 17 hrs, order has expired 0821 1038 Na 119, 3% saline drip restarted at 0923. Per nephrology note will increase drip to 30 ml/hr 0821 1251 Na 115  Rate changed to 30 ml/hr at 1351, continue current rate 0821 1543 Na 121 >> messaged nephrology (115 to 121?). Will change rate to 25 ml/hr.  8/21 1847 Na 124 > NaCl was continued at 53ml/hr 8/21 2222 Na 120 > trending down and NaCl still infusing at 40ml/hr.  D/W Dr Thedore Mins and will increase rate to 40ml/hr and continue to monitor 8/22 0152 Na 122, continue current rate (41ml/hr), K 3.1, will order KCL IV q1h x 4 bags ( total) 8/22 1001 Na 123  K 3.5, continue 3%Nacl at 30 ml/hr,   Goal of Therapy:  Recommended rate  of correction = no greater than 0.5 mmol/L/hr and no greater than 12 mmol/L/day  Plan:  -- Continue 3% NaCl drip to 30 mL/hr. (Per discussion with Nephrology). -- KCL IV q1hr x 2 bags  -- Continue Na checks q4h while on hypertonic saline -- F/U with am labs  Milia Warth A 04/09/2020 11:26 AM

## 2020-04-09 NOTE — Progress Notes (Signed)
PHARMACY CONSULT NOTE  Pharmacy Consult for Electrolyte Monitoring and Replacement   Recent Labs: Potassium (mmol/L)  Date Value  04/09/2020 3.6   Magnesium (mg/dL)  Date Value  02/40/9735 1.9   Calcium (mg/dL)  Date Value  32/99/2426 8.1 (L)   Albumin (g/dL)  Date Value  83/41/9622 3.0 (L)   Phosphorus (mg/dL)  Date Value  29/79/8921 2.5   Sodium (mmol/L)  Date Value  04/09/2020 123 (L)  04/09/2020 123 (L)   Assessment:  74 y.o. male with PMH of chronic alcohol use, HTN, recent admission for sepsis and malnutrition who presents from ALF apparently for difficulty swallowing. Patient is responsive on exam but non-verbal; apparently mental status is at baseline per report. Per report from facility  Na 108, pt started on 3% NaCl at 83ml/hr 0820 0154 Na 109, continue current plan  0820 0402 Na 112, continue current plan 0820 0624 Na 114, 3% NaCl decreased to 20 ml/hr 0820 0810 Na 112, continue current plan 0820 1008 Na 115, continue current plan 0820 1130 MD added D5NS at 125 mL/hr 0820 1254 Na 113, continue current plan 0820 1454 Na 113, continue current plan 0820 1711 Na 115, continue current rate 0820 2059 Na 117, continue current rate 0821 0213 Na 118, continue current rate  0821 0534 Na 120,  3% saline was ordered x 17 hrs,  order has expired 0821 1038 Na 119, 3% saline drip restarted at 0923. Per nephrology note will increase drip to 30 ml/hr 0821 1251 Na 115  Rate changed to 30 ml/hr at 1351,  continue current rate 0821 1543 Na 121 >> messaged nephrology (115 to  121?). Will change rate to 25 ml/hr.  8/21 1847 Na 124 > NaCl was continued at 75ml/hr 8/21 2222 Na 120 > trending down and NaCl still  infusing at 31ml/hr.  D/W Dr Thedore Mins and will  increase rate to 74ml/hr and continue to  monitor 8/22 0152 Na 122, continue current rate (38ml/hr), K  3.1, will order KCL IV q1h x 4 bags  ( total) 8/22 1001 Na 123  K 3.5, continue 3%Nacl at 30 ml/hr, 8/22 1238  Na 123, continue at 30 ml/hr. Note that KCL  IV's not hung yet by the time of this lab draw   Goal of Therapy:  Recommended rate of correction = no greater than 0.5 mmol/L/hr and no greater than 12 mmol/L/day  Plan:  -- Continue 3% NaCl drip to 30 mL/hr. (Per discussion with Nephrology). -- KCL IV q1hr x 2 bags  -- Continue Na checks q4h while on hypertonic saline -- F/U with am labs  Baljit Liebert A 04/09/2020 3:06 PM

## 2020-04-09 NOTE — Progress Notes (Signed)
[address] [place], [place] [address] Phone [phone]. Fax [phone]  Date: 04/09/2020                  Patient Name:  Javier Robinson  MRN: [id number]  DOB: 10-01-1945  Age / Sex: 74 y.o., male         PCP: Charlott Rakes, MD                 Service Requesting Consult: IM/ Kendell Bane, MD                 Reason for Consult: ARF            History of Present Illness: Patient is a 74 y.o. male with medical problems of chronic alcohol use, hypertension, recent admission for sepsis and malnutrition, who was admitted to [facility] on 04/06/2020 for evaluation of difficulty swallowing.  Patient is a resident of [facility].  He was sent for difficulty swallowing to even pured foods and solids.  He has been more drowsy couple days prior to admission Evaluation in the emergency room yesterday showed sodium of 110.  Baseline sodium appears to be 130-132 from August 11. Nephrology consult has now been requested for urgent evaluation of hyponatremia Medications prior to admission include sodium chloride tablets 1 g 3 times per day No Diuretic  Patient was started on 3% saline at 20 cc/h on 8/20 morning.    Sodium at 122 today, down from 124 Was started on d5-LR for hypoglycemia A little more alert today   Vital Signs: Blood pressure 132/68, pulse 83, temperature 98.2 F (36.8 C), temperature source Oral, resp. rate 16, height 5\' 7"  (1.702 m), weight 61 kg, SpO2 100 %.   Intake/Output Summary (Last 24 hours) at 04/09/2020 04/11/2020 Last data filed at 04/09/2020 0825 Gross per 24 hour  Intake 2976.5 ml  Output 300 ml  Net 2676.5 ml    Weight trends: Filed Weights   04/06/20 1456 04/08/20 1828  Weight: 63.5 kg 61 kg    Physical Exam: General:  Thin, cachectic, chronically ill-appearing, lying curled up  HEENT  dry oral mucous membranes, poor dentition  Lungs:  Normal breathing effort, decreased breath sounds at bases, room air  Heart::  No rub,  regular  Abdomen:  Soft, nontender, nondistended  Extremities:  No peripheral edema,   Neurologic:  Lethargic, was able to wake up to voice.  Could follow few basic commands  Skin:  Decreased turgor       Lab results: Basic Metabolic Panel: Recent Labs  Lab 04/08/20 1847 04/08/20 2222 04/09/20 0152  NA 124* 120* 122*  K 3.6 3.2* 3.1*  CL 97* 92* 93*  CO2 11* 20* 18*  GLUCOSE 43* 102* 85  BUN <5* <5* <5*  CREATININE <0.30* <0.30* 0.36*  CALCIUM 6.5* 7.9* 8.1*    Liver Function Tests: Recent Labs  Lab 04/06/20 1444  AST 27  ALT 13  ALKPHOS 75  BILITOT 0.7  PROT 7.9  ALBUMIN 3.0*   No results for input(s): LIPASE, AMYLASE in the last 168 hours. No results for input(s): AMMONIA in the last 168 hours.  CBC: Recent Labs  Lab 04/06/20 1444 04/06/20 2224 04/08/20 0534 04/09/20 0414  WBC 8.1   < > 7.8 6.5  NEUTROABS 6.5  --   --   --   HGB 9.7*   < > 8.6* 8.7*  HCT 28.7*   < > 23.9* 24.4*  MCV 78.6*   < >  76.1* 77.0*  PLT 358   < > 355 283   < > = values in this interval not displayed.    Cardiac Enzymes: No results for input(s): CKTOTAL, TROPONINI in the last 168 hours.  BNP: Invalid input(s): POCBNP  CBG: Recent Labs  Lab 04/06/20 1953 04/06/20 2110 04/06/20 2222 04/07/20 0156 04/08/20 1948  GLUCAP 85 66* 124* 81 157*    Microbiology: Recent Results (from the past 720 hour(s))  Blood Culture (routine x 2)     Status: None   Collection Time: 03/24/20  1:08 PM   Specimen: BLOOD  Result Value Ref Range Status   Specimen Description BLOOD  Final   Special Requests NONE  Final   Culture   Final    NO GROWTH 5 DAYS Performed at [facility], [address] [place], [place] [address]    Report Status 03/29/2020 FINAL  Final  Urine culture     Status: Abnormal   Collection Time: 03/24/20  1:08 PM   Specimen: In/Out Cath Urine  Result Value Ref Range Status   Specimen Description   Final    IN/OUT CATH URINE Performed at [facility], [address] [place], [place] [address]    Special Requests   Final    NONE Performed at [facility], [address] [place], [place] [address]    Culture MULTIPLE SPECIES PRESENT, SUGGEST RECOLLECTION (A)  Final   Report Status 03/26/2020 FINAL  Final  SARS Coronavirus 2 by RT PCR (hospital order, performed in [facility] hospital lab) Nasopharyngeal Nasopharyngeal Swab     Status: None   Collection Time: 03/24/20  4:36 PM   Specimen: Nasopharyngeal Swab  Result Value Ref Range Status   SARS Coronavirus 2 NEGATIVE NEGATIVE Final    Comment: (NOTE) SARS-CoV-2 target nucleic acids are NOT DETECTED.  The SARS-CoV-2 RNA is generally detectable in upper and lower respiratory specimens during the acute phase of infection. The lowest concentration of SARS-CoV-2 viral copies this assay can detect is 250 copies / mL. A negative result does not preclude SARS-CoV-2 infection and should not be used as the sole basis for treatment or other patient management decisions.  A negative result may occur with improper specimen collection / handling, submission of specimen other than nasopharyngeal swab, presence of viral mutation(s) within the areas targeted by this assay, and inadequate number of viral copies (<250 copies / mL). A negative result must be combined with clinical observations, patient history, and epidemiological information.  Fact Sheet for Patients:   BoilerBrush.com.cy  Fact Sheet for Healthcare Providers: https://pope.com/  This test is not yet approved or  cleared by the [place] FDA and has been authorized for detection and/or diagnosis of SARS-CoV-2 by FDA under an Emergency Use Authorization (EUA).  This EUA will remain in effect (meaning this test can be used) for the duration of the COVID-19 declaration under Section 564(b)(1) of the Act, 21 U.S.C. section 360bbb-3(b)(1), unless the  authorization is terminated or revoked sooner.  Performed at [facility], [address] [place], [place] [address]   Blood Culture (routine x 2)     Status: None   Collection Time: 03/25/20 12:53 AM   Specimen: BLOOD  Result Value Ref Range Status   Specimen Description BLOOD BLOOD RIGHT HAND  Final   Special Requests   Final    BOTTLES DRAWN AEROBIC AND ANAEROBIC Blood Culture adequate volume   Culture   Final    NO GROWTH  5 DAYS Performed at [facility], [address] [place], [place] [address]    Report Status 03/30/2020 FINAL  Final  SARS Coronavirus 2 by RT PCR (hospital order, performed in [facility] hospital lab) Nasopharyngeal Nasopharyngeal Swab     Status: None   Collection Time: 03/28/20 12:40 PM   Specimen: Nasopharyngeal Swab  Result Value Ref Range Status   SARS Coronavirus 2 NEGATIVE NEGATIVE Final    Comment: (NOTE) SARS-CoV-2 target nucleic acids are NOT DETECTED.  The SARS-CoV-2 RNA is generally detectable in upper and lower respiratory specimens during the acute phase of infection. The lowest concentration of SARS-CoV-2 viral copies this assay can detect is 250 copies / mL. A negative result does not preclude SARS-CoV-2 infection and should not be used as the sole basis for treatment or other patient management decisions.  A negative result may occur with improper specimen collection / handling, submission of specimen other than nasopharyngeal swab, presence of viral mutation(s) within the areas targeted by this assay, and inadequate number of viral copies (<250 copies / mL). A negative result must be combined with clinical observations, patient history, and epidemiological information.  Fact Sheet for Patients:   BoilerBrush.com.cyhttps://www.fda.gov/media/136312/download  Fact Sheet for Healthcare Providers: https://pope.com/https://www.fda.gov/media/136313/download  This test is not yet approved or  cleared by the [place] FDA and has been authorized  for detection and/or diagnosis of SARS-CoV-2 by FDA under an Emergency Use Authorization (EUA).  This EUA will remain in effect (meaning this test can be used) for the duration of the COVID-19 declaration under Section 564(b)(1) of the Act, 21 U.S.C. section 360bbb-3(b)(1), unless the authorization is terminated or revoked sooner.  Performed at [facility], [address] [place], [place] [address]   SARS Coronavirus 2 by RT PCR (hospital order, performed in [facility] hospital lab) Nasopharyngeal Nasopharyngeal Swab     Status: None   Collection Time: 04/06/20  4:00 PM   Specimen: Nasopharyngeal Swab  Result Value Ref Range Status   SARS Coronavirus 2 NEGATIVE NEGATIVE Final    Comment: (NOTE) SARS-CoV-2 target nucleic acids are NOT DETECTED.  The SARS-CoV-2 RNA is generally detectable in upper and lower respiratory specimens during the acute phase of infection. The lowest concentration of SARS-CoV-2 viral copies this assay can detect is 250 copies / mL. A negative result does not preclude SARS-CoV-2 infection and should not be used as the sole basis for treatment or other patient management decisions.  A negative result may occur with improper specimen collection / handling, submission of specimen other than nasopharyngeal swab, presence of viral mutation(s) within the areas targeted by this assay, and inadequate number of viral copies (<250 copies / mL). A negative result must be combined with clinical observations, patient history, and epidemiological information.  Fact Sheet for Patients:   BoilerBrush.com.cyhttps://www.fda.gov/media/136312/download  Fact Sheet for Healthcare Providers: https://pope.com/https://www.fda.gov/media/136313/download  This test is not yet approved or  cleared by the [place] FDA and has been authorized for detection and/or diagnosis of SARS-CoV-2 by FDA under an Emergency Use Authorization (EUA).  This EUA will remain in effect (meaning this test can be used)  for the duration of the COVID-19 declaration under Section 564(b)(1) of the Act, 21 U.S.C. section 360bbb-3(b)(1), unless the authorization is terminated or revoked sooner.  Performed at [facility], [address] [place], [place] [address]   MRSA PCR Screening     Status: None   Collection Time: 04/08/20  6:30 PM   Specimen: Nasopharyngeal  Result  Value Ref Range Status   MRSA by PCR NEGATIVE NEGATIVE Final    Comment:        The GeneXpert MRSA Assay (FDA approved for NASAL specimens only), is one component of a comprehensive MRSA colonization surveillance program. It is not intended to diagnose MRSA infection nor to guide or monitor treatment for MRSA infections. Performed at [facility], [address] [place], [place] [address]      Coagulation Studies: No results for input(s): LABPROT, INR in the last 72 hours.  Urinalysis: Recent Labs    04/06/20 1600  COLORURINE YELLOW*  LABSPEC 1.019  PHURINE 7.0  GLUCOSEU NEGATIVE  HGBUR NEGATIVE  BILIRUBINUR NEGATIVE  KETONESUR NEGATIVE  PROTEINUR NEGATIVE  NITRITE NEGATIVE  LEUKOCYTESUR NEGATIVE        Imaging: [place] EKG SITE RITE  Result Date: 04/08/2020 If Site Rite image not attached, placement could not be confirmed due to current cardiac rhythm.    Assessment & Plan: Pt is a 74 y.o. African-American  male with reported history of chronic alcohol use, hypertension, recent admission for sepsis and malnutrition, was admitted on 04/06/2020 with Acidemia [E87.2] Hyponatremia [E87.1]    #Hyponatremia Likely multifactorial with chronic hyponatremia likely from liver disease. Recent worsening likely secondary to malnutrition, decreased oral intake. Sodium did not correct with IV normal saline suggesting possibility of underlying SIADH. Agree with 3% hypertonic saline administration to increase sodium level Goal of correction 8 to 12 mEq in 24 hours. Continue to monitor Na closely D10 to  prevent hypoglycemia hypertonic saline( 3% ) at 30 cc/hr and may modify based on lab results Goal for correction to 128-130 by tomorrow AM   LOS: 3 Issis Lindseth 8/22/20219:39 AM    Note: This note was prepared with Dragon dictation. Any transcription errors are unintentional

## 2020-04-09 NOTE — Progress Notes (Signed)
Initial Nutrition Assessment  RD working remotely.  DOCUMENTATION CODES:   Not applicable  INTERVENTION:  Will monitor for outcome of SLP evaluation and conversations regarding goals of care and make appropriate recommendations.  NUTRITION DIAGNOSIS:   Inadequate oral intake related to inability to eat as evidenced by NPO status.  GOAL:   Patient will meet greater than or equal to 90% of their needs  MONITOR:   Diet advancement, Labs, Weight trends, Skin, I & O's  REASON FOR ASSESSMENT:   Consult Assessment of nutrition requirement/status, Poor PO  ASSESSMENT:   73 year old male with PMHx of arthritis, seizures, EtOH use admitted from [facility] with metabolic acidosis, metabolic encephalopathy, dehydration.   Patient unable to provide history over the phone at this time. Per review of chart today when MD awakened patient he was only able to state his name. Attempted to call patient's niece to obtain nutrition/weight history but call went to voicemail and voicemail box was full so could not leave a message requesting a return call. Per review of chart patient is currently NPO awaiting SLP evaluation. An attempt was made at placing NGT but this was unsuccessful as patient became agitated. Pending palliative medicine consult.  Per weight history in chart patient was 84.4 kg on 04/09/2018 and has lost weight over the last two years. Weight variable so unable to trend very accurately. He is currently 61 kg (134.48 lbs).  Medications reviewed and include: ferrous sulfate 325 mg TID, folic acid 1 mg daily, MVI daily, Flomax, thiamine 100 mg daily, D10 at 50 mL/hr, potassium chloride 10 mEq IV x2 today.  Labs reviewed: Sodium 123, Chloride 94, CO2 18, BUN <5, Creatinine 0.42.  Patient is at risk for malnutrition. Unable to determine if patient meets criteria for malnutrition at this time.  NUTRITION - FOCUSED PHYSICAL EXAM:  Unable to complete at this time as RD is working  remotely.  Diet Order:   Diet Order            Diet NPO time specified  Diet effective now                EDUCATION NEEDS:   No education needs have been identified at this time  Skin:  Skin Assessment: Skin Integrity Issues: Skin Integrity Issues:: Stage II, Unstageable, Other (Comment) Stage II: sacrum, right buttocks, right hip Unstageable: buttocks Other: Non-pressure wound right elbow  Last BM:  Unknown  Height:   Ht Readings from Last 1 Encounters:  04/08/20 5\' 7"  (1.702 m)   Weight:   Wt Readings from Last 1 Encounters:  04/08/20 61 kg   BMI:  Body mass index is 21.06 kg/m.  Estimated Nutritional Needs:   Kcal:  1600-1800  Protein:  80-90 grams  Fluid:  1.5 L/day  04/10/20, MS, RD, LDN Pager number available on Amion

## 2020-04-09 NOTE — Progress Notes (Signed)
PHARMACY CONSULT NOTE  Pharmacy Consult for Electrolyte Monitoring and Replacement   Recent Labs: Potassium (mmol/L)  Date Value  04/09/2020 3.7   Magnesium (mg/dL)  Date Value  13/24/4010 1.5 (L)   Calcium (mg/dL)  Date Value  27/25/3664 7.8 (L)   Albumin (g/dL)  Date Value  40/34/7425 3.0 (L)   Phosphorus (mg/dL)  Date Value  95/63/8756 2.0 (L)   Sodium (mmol/L)  Date Value  04/09/2020 123 (L)   Assessment:  74 y.o. male with PMH of chronic alcohol use, HTN, recent admission for sepsis and malnutrition who presents from ALF apparently for difficulty swallowing. Patient is responsive on exam but non-verbal; apparently mental status is at baseline per report. Per report from facility  Na 108, pt started on 3% NaCl at 57ml/hr 0820 0154 Na 109, continue current plan  0820 0402 Na 112, continue current plan 0820 0624 Na 114, 3% NaCl decreased to 20 ml/hr 0820 0810 Na 112, continue current plan 0820 1008 Na 115, continue current plan 0820 1130 MD added D5NS at 125 mL/hr 0820 1254 Na 113, continue current plan 0820 1454 Na 113, continue current plan 0820 1711 Na 115, continue current rate 0820 2059 Na 117, continue current rate 0821 0213 Na 118, continue current rate  0821 0534 Na 120, 3% saline was ordered x 17 hrs, order has expired 0821 1038 Na 119, 3% saline drip restarted at 0923. Per nephrology note will increase drip to 30 ml/hr 0821 1251 Na 115  Rate changed to 30 ml/hr at 1351, continue current rate 0821 1543 Na 121 >> messaged nephrology (115 to 121?). Will change rate to 25 ml/hr.  8/21 1847 Na 124 > NaCl was continued at 24ml/hr 8/21 2222 Na 120 > trending down and NaCl still infusing at 109ml/hr.  D/W Dr Thedore Mins and will increase rate to 4ml/hr and continue to monitor 8/22 0152 Na 122, continue current rate (37ml/hr), K 3.1, will order KCL IV q1h x 4 bags ( total) 8/22 1001 Na 123  K 3.5, continue 3% Nacl at 30 ml/hr 8/22 1238 Na 123, continue at  30 ml/hr. Note that KCL IV's not hung yet by the time of this lab draw 8/22 1800 Na 121, continue at 30 mL/hr. D10 drip discontinued with start of tube feeds per nephrology 8/22 2045 Na 123   Goal of Therapy:  Goal of correction 8 to 12 mEq in 24 hours, per nephrology   Plan:  -- Continue 3% NaCl drip to 30 mL/hr (per discussion with Nephrology) and discontinue D10 drip given start of tube feeds -- Continue Na checks q4h while on hypertonic saline -- Current rate 30 cch/hr; Current Na 123  -- No changes to infusion at this time.  -- F/U all electrolytes with AM labs  Katha Cabal 04/09/2020 9:32 PM

## 2020-04-09 NOTE — Progress Notes (Addendum)
PHARMACY CONSULT NOTE  Pharmacy Consult for Electrolyte Monitoring and Replacement   Recent Labs: Potassium (mmol/L)  Date Value  04/09/2020 3.7   Magnesium (mg/dL)  Date Value  16/05/9603 1.5 (L)   Calcium (mg/dL)  Date Value  54/04/8118 7.9 (L)   Albumin (g/dL)  Date Value  14/78/2956 3.0 (L)   Phosphorus (mg/dL)  Date Value  21/30/8657 2.0 (L)   Sodium (mmol/L)  Date Value  04/09/2020 121 (L)   Assessment:  74 y.o. male with PMH of chronic alcohol use, HTN, recent admission for sepsis and malnutrition who presents from ALF apparently for difficulty swallowing. Patient is responsive on exam but non-verbal; apparently mental status is at baseline per report. Per report from facility  Na 108, pt started on 3% NaCl at 50ml/hr 0820 0154 Na 109, continue current plan  0820 0402 Na 112, continue current plan 0820 0624 Na 114, 3% NaCl decreased to 20 ml/hr 0820 0810 Na 112, continue current plan 0820 1008 Na 115, continue current plan 0820 1130 MD added D5NS at 125 mL/hr 0820 1254 Na 113, continue current plan 0820 1454 Na 113, continue current plan 0820 1711 Na 115, continue current rate 0820 2059 Na 117, continue current rate 0821 0213 Na 118, continue current rate  0821 0534 Na 120, 3% saline was ordered x 17 hrs, order has expired 0821 1038 Na 119, 3% saline drip restarted at 0923. Per nephrology note will increase drip to 30 ml/hr 0821 1251 Na 115  Rate changed to 30 ml/hr at 1351, continue current rate 0821 1543 Na 121 >> messaged nephrology (115 to 121?). Will change rate to 25 ml/hr.  8/21 1847 Na 124 > NaCl was continued at 40ml/hr 8/21 2222 Na 120 > trending down and NaCl still infusing at 75ml/hr.  D/W Dr Thedore Mins and will increase rate to 15ml/hr and continue to monitor 8/22 0152 Na 122, continue current rate (22ml/hr), K 3.1, will order KCL IV q1h x 4 bags ( total) 8/22 1001 Na 123  K 3.5, continue 3% Nacl at 30 ml/hr 8/22 1238 Na 123, continue at  30 ml/hr. Note that KCL IV's not hung yet by the time of this lab draw 8/22 1800 Na 121, continue at 30 mL/hr. D10 drip discontinued with start of tube feeds per nephrology   Goal of Therapy:  Recommended rate of correction = no greater than 0.5 mmol/L/hr and no greater than 12 mmol/L/day  Plan:  --Continue 3% NaCl drip to 30 mL/hr (per discussion with Nephrology) and discontinue D10 drip given start of tube feeds --K 3.7 , Phos 2, will order potassium phosphate 15 mmol x 1 --Mg 1.5, will order magnesium sulfate 2 g IV x 1 -- Continue Na checks q4h while on hypertonic saline -- F/U all electrolytes with AM labs  Tressie Ellis 04/09/2020 6:56 PM

## 2020-04-10 DIAGNOSIS — Z515 Encounter for palliative care: Secondary | ICD-10-CM

## 2020-04-10 DIAGNOSIS — Z66 Do not resuscitate: Secondary | ICD-10-CM

## 2020-04-10 DIAGNOSIS — R627 Adult failure to thrive: Secondary | ICD-10-CM

## 2020-04-10 LAB — GLUCOSE, CAPILLARY
Glucose-Capillary: 88 mg/dL (ref 70–99)
Glucose-Capillary: 96 mg/dL (ref 70–99)
Glucose-Capillary: 75 mg/dL (ref 70–99)

## 2020-04-10 LAB — CBC
HCT: 23.2 % — ABNORMAL LOW (ref 39.0–52.0)
Hemoglobin: 8.3 g/dL — ABNORMAL LOW (ref 13.0–17.0)
MCH: 27.5 pg (ref 26.0–34.0)
MCHC: 35.8 g/dL (ref 30.0–36.0)
MCV: 76.8 fL — ABNORMAL LOW (ref 80.0–100.0)
Platelets: 259 10*3/uL (ref 150–400)
RBC: 3.02 MIL/uL — ABNORMAL LOW (ref 4.22–5.81)
RDW: 17.6 % — ABNORMAL HIGH (ref 11.5–15.5)
WBC: 5.7 10*3/uL (ref 4.0–10.5)
nRBC: 0 % (ref 0.0–0.2)

## 2020-04-10 LAB — BASIC METABOLIC PANEL
Anion gap: 9 (ref 5–15)
BUN: <5 mg/dL — ABNORMAL LOW (ref 8–23)
CO2: 19 mmol/L — ABNORMAL LOW (ref 22–32)
Calcium: 8 mg/dL — ABNORMAL LOW (ref 8.9–10.3)
Chloride: 93 mmol/L — ABNORMAL LOW (ref 98–111)
Creatinine, Ser: <0.3 mg/dL — ABNORMAL LOW (ref 0.61–1.24)
Glucose, Bld: 99 mg/dL (ref 70–99)
Potassium: 3.7 mmol/L (ref 3.5–5.1)
Sodium: 121 mmol/L — ABNORMAL LOW (ref 135–145)

## 2020-04-10 LAB — SODIUM, URINE, RANDOM: Sodium, Ur: 262 mmol/L

## 2020-04-10 LAB — OSMOLALITY: Osmolality: 255 mOsm/kg — ABNORMAL LOW (ref 275–295)

## 2020-04-10 LAB — OSMOLALITY, URINE: Osmolality, Ur: 637 mOsm/kg (ref 300–900)

## 2020-04-10 LAB — MAGNESIUM: Magnesium: 1.9 mg/dL (ref 1.7–2.4)

## 2020-04-10 LAB — PHOSPHORUS: Phosphorus: 3.1 mg/dL (ref 2.5–4.6)

## 2020-04-10 LAB — SODIUM
Sodium: 125 mmol/L — ABNORMAL LOW (ref 135–145)
Sodium: 125 mmol/L — ABNORMAL LOW (ref 135–145)

## 2020-04-10 MED ORDER — ORAL CARE MOUTH RINSE
15.0000 mL | Freq: Two times a day (BID) | OROMUCOSAL | Status: DC
  Administered 2020-04-10 – 2020-04-11 (×3): 15 mL via OROMUCOSAL

## 2020-04-10 MED ORDER — LORAZEPAM 1 MG PO TABS: 1.0000 mg | ORAL_TABLET | ORAL | Status: DC | PRN

## 2020-04-10 MED ORDER — SODIUM CHLORIDE 3 % IV SOLN
INTRAVENOUS | Status: DC
  Filled 2020-04-10 (×2): qty 500

## 2020-04-10 MED ORDER — MORPHINE SULFATE (CONCENTRATE) 10 MG/0.5ML PO SOLN: 5.0000 mg | ORAL | Status: DC | PRN

## 2020-04-10 NOTE — Progress Notes (Signed)
[facility]  ROUNDING NOTE   Subjective:  Patient a bit agitated this a.m. Serum sodium up to 121. Good urine output of 1.1 L.   Objective:  Vital signs in last 24 hours:  Temp:  [97.7 F (36.5 C)-98.4 F (36.9 C)] 97.7 F (36.5 C) (08/23 0138) Pulse Rate:  [36-94] 80 (08/23 0700) Resp:  [12-22] 15 (08/23 0700) BP: (102-145)/(61-119) 145/119 (08/23 0700) SpO2:  [83 %-100 %] 100 % (08/23 0700) Weight:  [60.8 kg] 60.8 kg (08/23 0145)  Weight change: -0.2 kg Filed Weights   04/06/20 1456 04/08/20 1828 04/10/20 0145  Weight: 63.5 kg 61 kg 60.8 kg    Intake/Output: I/O last 3 completed shifts: In: 4307.9 [I.V.:2817.4; NG/GT:585; IV Piggyback:905.5] Out: 1475 [Urine:1475]   Intake/Output this shift:  No intake/output data recorded.  Physical Exam: General:  Agitated  Head:  Normocephalic, atraumatic. Moist oral mucosal membranes  Eyes:  Anicteric  Neck:  Supple  Lungs:   Clear to auscultation, normal effort  Heart:  S1S2 no rubs  Abdomen:   Soft, nontender, bowel sounds present  Extremities:  No peripheral edema.  Neurologic:  Awake, alert, but agitated  Skin:  No lesions       Basic Metabolic Panel: Recent Labs  Lab 04/09/20 1001 04/09/20 1001 04/09/20 1238 04/09/20 1238 04/09/20 1800 04/09/20 2045 04/10/20 0156  NA 123*  --  123*  123*  --  121* 123* 121*  K 3.5  --  3.6  --  3.7 3.7 3.7  CL 95*  --  94*  --  96* 95* 93*  CO2 19*  --  18*  --  19* 20* 19*  GLUCOSE 75  --  83  --  87 96 99  BUN <5*  --  <5*  --  <5* <5* <5*  CREATININE <0.30*  --  0.42*  --  <0.30* <0.30* <0.30*  CALCIUM 7.8*   < > 8.1*   < > 7.9* 7.8* 8.0*  MG  --   --   --   --  1.5*  --  1.9  PHOS  --   --   --   --  2.0*  --  3.1   < > = values in this interval not displayed.    Liver Function Tests: Recent Labs  Lab 04/06/20 1444  AST 27  ALT 13  ALKPHOS 75  BILITOT 0.7  PROT 7.9  ALBUMIN 3.0*   No results for input(s): LIPASE, AMYLASE in the last 168  hours. No results for input(s): AMMONIA in the last 168 hours.  CBC: Recent Labs  Lab 04/06/20 1444 04/06/20 1444 04/06/20 2224 04/07/20 0537 04/08/20 0534 04/09/20 0414 04/10/20 0156  WBC 8.1   < > 10.6* 11.4* 7.8 6.5 5.7  NEUTROABS 6.5  --   --   --   --   --   --   HGB 9.7*   < > 8.9* 8.8* 8.6* 8.7* 8.3*  HCT 28.7*   < > 25.6* 24.3* 23.9* 24.4* 23.2*  MCV 78.6*   < > 77.8* 75.7* 76.1* 77.0* 76.8*  PLT 358   < > 351 359 355 283 259   < > = values in this interval not displayed.    Cardiac Enzymes: No results for input(s): CKTOTAL, CKMB, CKMBINDEX, TROPONINI in the last 168 hours.  BNP: Invalid input(s): POCBNP  CBG: Recent Labs  Lab 04/08/20 1948 04/09/20 1930 04/09/20 2354 04/10/20 0337 04/10/20 0719  GLUCAP 157* 82 92 88 96  Microbiology: Results for orders placed or performed during the hospital encounter of 04/06/20  SARS Coronavirus 2 by RT PCR (hospital order, performed in [facility] hospital lab) Nasopharyngeal Nasopharyngeal Swab     Status: None   Collection Time: 04/06/20  4:00 PM   Specimen: Nasopharyngeal Swab  Result Value Ref Range Status   SARS Coronavirus 2 NEGATIVE NEGATIVE Final    Comment: (NOTE) SARS-CoV-2 target nucleic acids are NOT DETECTED.  The SARS-CoV-2 RNA is generally detectable in upper and lower respiratory specimens during the acute phase of infection. The lowest concentration of SARS-CoV-2 viral copies this assay can detect is 250 copies / mL. A negative result does not preclude SARS-CoV-2 infection and should not be used as the sole basis for treatment or other patient management decisions.  A negative result may occur with improper specimen collection / handling, submission of specimen other than nasopharyngeal swab, presence of viral mutation(s) within the areas targeted by this assay, and inadequate number of viral copies (<250 copies / mL). A negative result must be combined with clinical observations, patient  history, and epidemiological information.  Fact Sheet for Patients:   BoilerBrush.com.cy  Fact Sheet for Healthcare Providers: https://pope.com/  This test is not yet approved or  cleared by the [place] FDA and has been authorized for detection and/or diagnosis of SARS-CoV-2 by FDA under an Emergency Use Authorization (EUA).  This EUA will remain in effect (meaning this test can be used) for the duration of the COVID-19 declaration under Section 564(b)(1) of the Act, 21 U.S.C. section 360bbb-3(b)(1), unless the authorization is terminated or revoked sooner.  Performed at [facility], [address] [place], [place] [address]   MRSA PCR Screening     Status: None   Collection Time: 04/08/20  6:30 PM   Specimen: Nasopharyngeal  Result Value Ref Range Status   MRSA by PCR NEGATIVE NEGATIVE Final    Comment:        The GeneXpert MRSA Assay (FDA approved for NASAL specimens only), is one component of a comprehensive MRSA colonization surveillance program. It is not intended to diagnose MRSA infection nor to guide or monitor treatment for MRSA infections. Performed at [facility], [address] [place], [place] [address]     Coagulation Studies: No results for input(s): LABPROT, INR in the last 72 hours.  Urinalysis: No results for input(s): COLORURINE, LABSPEC, PHURINE, GLUCOSEU, HGBUR, BILIRUBINUR, KETONESUR, PROTEINUR, UROBILINOGEN, NITRITE, LEUKOCYTESUR in the last 72 hours.  Invalid input(s): APPERANCEUR    Imaging: DG Abd 1 View  Result Date: 04/09/2020 CLINICAL DATA:  In G-tube placement EXAM: ABDOMEN - 1 VIEW COMPARISON:  None. FINDINGS: Enteric tube terminates in the proximal stomach overlying the gastric fundus. No evidence of pneumatosis or pneumoperitoneum. IMPRESSION: Enteric tube terminates in the proximal stomach overlying the gastric fundus. Electronically Signed   By: Delbert Phenix M.D.   On: 04/09/2020 16:10   [place] EKG SITE RITE  Result Date: 04/08/2020 If Site Rite image not attached, placement could not be confirmed due to current cardiac rhythm.    Medications:   . feeding supplement (OSMOLITE 1.2 CAL) 1,000 mL (04/09/20 1735)  . sodium chloride (hypertonic) 30 mL/hr (04/09/20 0013)   . Chlorhexidine Gluconate Cloth  6 each Topical Daily  . enoxaparin (LOVENOX) injection  40 mg Subcutaneous Q24H  . ferrous sulfate  325 mg Per Tube TID  . folic acid  1 mg Oral Daily  . mouth rinse  15 mL Mouth Rinse  BID  . multivitamin with minerals  1 tablet Oral Daily  . Nepro  1 Bottle Oral TID WC  . tamsulosin  0.4 mg Oral QPC supper  . thiamine  100 mg Oral Daily     Assessment/ Plan:  74 y.o. male with reported history of chronic alcohol use, hypertension, recent admission for sepsis and malnutrition, was admitted on 04/06/2020 with Acidemia [E87.2] Hyponatremia [E87.1]   1.  Hyponatremia.  Serum sodium still significantly low at 121.  He is on 3% saline at 30 cc/h.  Increase this to 40 cc/h for now and continue to monitor serum sodium closely.  Recommend target correction rate of 8 mEq/day.  2.  Metabolic acidosis.  Serum bicarbonate currently 19.  Hold off on bicarbonate repletion for now.   LOS: 4 Apolo Cutshaw 8/23/20218:12 AM

## 2020-04-10 NOTE — Progress Notes (Signed)
Referral received for goals of care. Due to increase consult volumes and limited availability their will be a delay in seeing this patient.  °  °If you have urgent needs requiring recommendations of care while consult is pending please contact our team line at [phone] and one of our providers will attempt to assist.  °  °Every attempt will be made to see patient in upcoming days if hospitalized.  °  °Thank you for your referral.  °  °Palliative Medicine Team  °

## 2020-04-10 NOTE — Progress Notes (Signed)
PHARMACY CONSULT NOTE  Pharmacy Consult for Electrolyte Monitoring and Replacement   Recent Labs: Potassium (mmol/L)  Date Value  04/10/2020 3.7   Magnesium (mg/dL)  Date Value  63/87/5643 1.9   Calcium (mg/dL)  Date Value  32/95/1884 8.0 (L)   Albumin (g/dL)  Date Value  16/60/6301 3.0 (L)   Phosphorus (mg/dL)  Date Value  60/05/9322 3.1   Sodium (mmol/L)  Date Value  04/10/2020 121 (L)   Assessment:  74 y.o. male with PMH of chronic alcohol use, HTN, recent admission for sepsis and malnutrition who presents from ALF apparently for difficulty swallowing. Patient is responsive on exam but non-verbal; apparently mental status is at baseline per report. Per report from facility  Na 108, pt started on 3% NaCl at 36ml/hr 0820 0154 Na 109, continue current plan  0820 0402 Na 112, continue current plan 0820 0624 Na 114, 3% NaCl decreased to 20 ml/hr 0820 0810 Na 112, continue current plan 0820 1008 Na 115, continue current plan 0820 1130 MD added D5NS at 125 mL/hr 0820 1254 Na 113, continue current plan 0820 1454 Na 113, continue current plan 0820 1711 Na 115, continue current rate 0820 2059 Na 117, continue current rate 0821 0213 Na 118, continue current rate  0821 0534 Na 120, 3% saline was ordered x 17 hrs, order has expired 0821 1038 Na 119, 3% saline drip restarted at 0923. Per nephrology note will increase drip to 30 ml/hr 0821 1251 Na 115  Rate changed to 30 ml/hr at 1351, continue current rate 0821 1543 Na 121 >> messaged nephrology (115 to 121?). Will change rate to 25 ml/hr.  8/21 1847 Na 124 > NaCl was continued at 31ml/hr 8/21 2222 Na 120 > trending down and NaCl still infusing at 42ml/hr.  D/W Dr Thedore Mins and will increase rate to 28ml/hr and continue to monitor 8/22 0152 Na 122, continue current rate (51ml/hr), K 3.1, will order KCL IV q1h x 4 bags ( total) 8/22 1001 Na 123  K 3.5, continue 3% Nacl at 30 ml/hr 8/22 1238 Na 123, continue at 30 ml/hr.  Note that KCL IV's not hung yet by the time of this lab draw 8/22 1800 Na 121, continue at 30 mL/hr. D10 drip discontinued with start of tube feeds per nephrology 8/22 2045 Na 123 8/23 0156 Na 121   Goal of Therapy:  Goal of correction 8 to 12 mEq in 24 hours, per nephrology   Plan:  -- Continue 3% NaCl drip to 30 mL/hr (per discussion with Nephrology) and discontinue D10 drip given start of tube feeds -- Continue Na checks q4h while on hypertonic saline -- Current rate 30 cch/hr; Current Na 121 -- No changes to infusion at this time.  -- F/U all electrolytes with AM labs  Valrie Hart A 04/10/2020 3:59 AM

## 2020-04-10 NOTE — Progress Notes (Addendum)
PROGRESS NOTE    Patient: Javier Robinson                            PCP: Javier Rakes, MD                    DOB: 06/07/1946            DOA: 04/06/2020 ONG:[id number]             DOS: 04/10/2020, 12:40 PM   LOS: 4 days   Date of Service: The patient was seen and examined on 04/10/2020  Subjective:   The patient was seen and examined this morning.  Mentation mildly improved still confused only oriented to self. Yesterday with to attempt from nursing staff NG tube was placed, tube feed was started but this morning he had pulled it up, became agitated. Sodium still remain low 121 this a.m., nephrology restarted hypertonic saline   Palliative care consulted -Will recheck the family member once again as his prognosis remains poor   Brief Narrative:   HPI: Javier Robinson 74 yo M with PMHx  chronic alcohol use, HTN, recent admission for sepsis and malnutrition who presents from ALF apparently for difficulty swallowing,  generalized weakness, confusion, difficulty . Reviewed records, pt was just hospitalized for sepsis, d/c to SNF. No apparent focal deficits on exam. CT Head shows NAICA. CXR w/o PNA or apparent major aspiration.  Apparently mental status is at baseline per report. Per report from facility [facility])  Labs as above. BMP with profound hyponatremia ( Na 110) , acidemia, hypoglycemia. Suspect related to poor PO intake. NS at 100.   Assessment & Plan:   Active Problems:   Metabolic acidosis, increased anion gap (IAG)   Hypoglycemia   Hyponatremia   Alcohol dependence (HCC)   Essential hypertension   Protein-calorie malnutrition, severe   Microcytic anemia  Hyponatremia and Hypochloremia with Non Anion Gap Metabolic Acidosis  -Sodium level 121 this morning  -NG tube was placed hoping sodium level improved with tube feeds, unfortunately he pulled it out this a.m. -Nephrology following restart the patient on 3% saline  -Multifactorial including poor p.o. intake,  dehydration --- ?  SIADH  -Appreciating nephrology assist, -Sodium on admission 108, 109, 112, 114, 112, 115 >> 119 >>> 122, 123... 125,125  now -Patient was started on hypertonic normal saline overnight--- continue to titrate, briefly held --Supplement fluids   On admission Na  110. Cl  79. Bicarb 17. Patient appeared hypovolemic on admission. Does appear that he sometimes has chronic mild hyponatremia in the low 130s, likely compounded by alcohol use. Not on thiazide diuretic or other offending agents. CT head was negative. Appears to be at baseline for mental status.    Metabolic encephalopathy  -Baseline cognition, insight cannot be determined, recent history of stroke Family member stating patient was verbal prior to admission -Patient has had a recent stroke -Verbal communication interaction and insight still compromised due to poor hydration, stroke, hyponatremia -Continue to monitor very closely,  -Continue neurochecks -Avoiding correcting sodium level rapidly   Poor p.o. intake/dehydration/hypoglycemia  -Unable to completed dysphagia evaluation as patient remains encephalopathic -NG tube was placed x2 by ICU staff-patient become agitated pulled out this morning -Patient has been restarted on IV fluid resuscitation, hypertonic saline  -Episodic hypoglycemia, will be addressed with D10/D50  as needed Blood sugar is improved, D10 NS was initiated on admission glucose 49. uspect related to poor  PO intake.  -Checking CBG every 4 hours  Severe malnutrition-poor p.o. intake/dysphagia (in the setting of previous stroke) -Speech will be consulted for evaluation -But at this time patient remains encephalopathic -With attempt of NG tube placement patient become agitated --nursing staff was not able to place the NG tube -We will wait till encephalopathy improves before reevaluation -Family has been made aware of poor p.o. intake, prognosis with comorbidities remains very  poor  HTN /hypotension -Monitoring blood pressure currently stable -Monitoring --Given poor PO intake, will hold medications.  -hold Amlodipine   Microcytic Anemia  HgB is 9.7, >>> 8.8  >> 8.7 >>  -improved from recent hospitalization with value of 7.5. MCV low.  -continue Fe supplement  -Monitoring, no changes  Alcohol Dependence  -No signs of withdrawal, no seizure episodes, vitals remained stable Unclear if he still using alcohol-  has been at ALF.  -monitor closely, start CIWA as needed  -continue MVI, thiamine, folic acid    Hypokalemia --Monitoring closely, anticipating improvement with IVF LR  Ethics -DNR/DNI confirmed - due to multiple comorbidities, continued metabolic encephalopathy poor p.o. intake, severe hyponatremia malnutrition, recent stroke --- prognosis remain grim Family was updated was informed of the findings-agreed with current plan Palliative care consulted -appreciate assistance Reaching out to family once again Javier Robinson  [phone]   Nutritional status:  Nutrition Problem: Inadequate oral intake Etiology: inability to eat Signs/Symptoms: NPO status Interventions: Refer to RD note for recommendations  Consultants: Nephrology PCCM Palliative care consulted   ---------------------------------------------------------------------------------------------------------------------------------------------  DVT prophylaxis:  SCD/Compression stockings and Lovenox SQ Code Status:   Code Status: DNR Family Communication: Javier Robinson  [phone] No family member present at bedside- Called Niece at [phone] -- updated  Discussed in detail including goal of care and poor prognosis--  She expressed understanding, agreeable to palliative care consultation.    Admission status:    Status is: Inpatient  Remains inpatient appropriate because:Inpatient level of care appropriate due to severity of illness   Dispo: The patient is from: SNF               Anticipated d/c is to: SNF              Anticipated d/c date is: 3 days              Patient currently is not medically stable to d/c.   Critical care time greater than 55 minutes was spent in ICU setting seen evaluate patient, determine plan of care, reviewing meds, history, electronic records, discussing plan of care with consultants, ICU nursing staff, Consultation, determine goal of care, contacting family members extensive discussion regarding goals of care and CODE STATUS.     Procedures:   No admission procedures for hospital encounter.     Antimicrobials:  Anti-infectives (From admission, onward)   None       Medication:  . Chlorhexidine Gluconate Cloth  6 each Topical Daily  . enoxaparin (LOVENOX) injection  40 mg Subcutaneous Q24H  . ferrous sulfate  325 mg Per Tube TID  . folic acid  1 mg Oral Daily  . mouth rinse  15 mL Mouth Rinse BID  . multivitamin with minerals  1 tablet Oral Daily  . tamsulosin  0.4 mg Oral QPC supper  . thiamine  100 mg Oral Daily       Objective:   Vitals:   04/10/20 0900 04/10/20 1000 04/10/20 1100 04/10/20 1200  BP: (!) 142/69 130/67 (!) 146/56 (!) 145/82  Pulse: 77 80  76 76  Resp: 17 19 16 14   Temp:      TempSrc:      SpO2: 100% 100% 99% 99%  Weight:      Height:        Intake/Output Summary (Last 24 hours) at 04/10/2020 1240 Last data filed at 04/10/2020 1114 Gross per 24 hour  Intake 2002.45 ml  Output 1100 ml  Net 902.45 ml   Filed Weights   04/06/20 1456 04/08/20 1828 04/10/20 0145  Weight: 63.5 kg 61 kg 60.8 kg     Examination:      Physical Exam:   General:   Improve mental status but still only oriented to self,  HEENT:  Normocephalic, PERRL, otherwise with in Normal limits   Neuro:  CNII-XII intact. , normal motor and sensation, reflexes intact   Lungs:   Clear to auscultation BL, Respirations unlabored, no wheezes / crackles  Cardio:    S1/S2, RRR, No murmure, No Rubs or Gallops    Abdomen:   Soft, non-tender, bowel sounds active all four quadrants,  no guarding or peritoneal signs.  Muscular skeletal:   Generalized weaknesses Limited exam - in bed, able to move all 4 extremities, mildly reduced strength left leg, withdraws to pain 2+ pulses,  symmetric, No pitting edema  Skin:  Dry, warm to touch, negative for any Rashes, No open wounds  Wounds: Please see nursing documentation Pressure Injury 11/14/19 Hip Right Stage 2 -  Partial thickness loss of dermis presenting as a shallow open injury with a red, pink wound bed without slough. open wound (Active)  11/14/19 1110  Location: Hip  Location Orientation: Right  Staging: Stage 2 -  Partial thickness loss of dermis presenting as a shallow open injury with a red, pink wound bed without slough.  Wound Description (Comments): open wound  Present on Admission:      Pressure Injury 11/14/19 Thigh Posterior;Proximal Stage 1 -  Intact skin with non-blanchable redness of a localized area usually over a bony prominence. (Active)  11/14/19 1114  Location: Thigh  Location Orientation: Posterior;Proximal  Staging: Stage 1 -  Intact skin with non-blanchable redness of a localized area usually over a bony prominence.  Wound Description (Comments):   Present on Admission:      Pressure Injury 03/24/20 Buttocks Right;Left;Mid Unstageable - Full thickness tissue loss in which the base of the injury is covered by slough (yellow, tan, [place], green or brown) and/or eschar (tan, brown or black) in the wound bed. Pt has two pressure i (Active)  03/24/20 1200  Location: Buttocks  Location Orientation: Right;Left;Mid  Staging: Unstageable - Full thickness tissue loss in which the base of the injury is covered by slough (yellow, tan, [place], green or brown) and/or eschar (tan, brown or black) in the wound bed.  Wound Description (Comments): Pt has two pressure injuries on sacrum. Right sided pressure injury is unstageable   Present on  Admission: Yes     Pressure Injury 03/24/20 Sacrum Medial Stage 2 -  Partial thickness loss of dermis presenting as a shallow open injury with a red, pink wound bed without slough. (Active)  03/24/20 2230  Location: Sacrum  Location Orientation: Medial  Staging: Stage 2 -  Partial thickness loss of dermis presenting as a shallow open injury with a red, pink wound bed without slough.  Wound Description (Comments):   Present on Admission: Yes     Pressure Injury 03/24/20 Buttocks Right Stage 2 -  Partial thickness loss of dermis presenting  as a shallow open injury with a red, pink wound bed without slough. (Active)  03/24/20 2230  Location: Buttocks  Location Orientation: Right  Staging: Stage 2 -  Partial thickness loss of dermis presenting as a shallow open injury with a red, pink wound bed without slough.  Wound Description (Comments):   Present on Admission: Yes     Pressure Injury 03/24/20 Hip Right Stage 2 -  Partial thickness loss of dermis presenting as a shallow open injury with a red, pink wound bed without slough. (Active)  03/24/20 2230  Location: Hip  Location Orientation: Right  Staging: Stage 2 -  Partial thickness loss of dermis presenting as a shallow open injury with a red, pink wound bed without slough.  Wound Description (Comments):   Present on Admission: Yes              ------------------------------------------------------------------------------------------------------------------------------------------    LABs:  CBC Latest Ref Rng & Units 04/10/2020 04/09/2020 04/08/2020  WBC 4.0 - 10.5 K/uL 5.7 6.5 7.8  Hemoglobin 13.0 - 17.0 g/dL 8.3(L) 8.7(L) 8.6(L)  Hematocrit 39 - 52 % 23.2(L) 24.4(L) 23.9(L)  Platelets 150 - 400 K/uL 259 283 355   CMP Latest Ref Rng & Units 04/10/2020 04/10/2020 04/09/2020  Glucose 70 - 99 mg/dL - 99 96  BUN 8 - 23 mg/dL - <9(J) <1(O)  Creatinine 0.61 - 1.24 mg/dL - <8.41(Y) <6.06(T)  Sodium 135 - 145 mmol/L 125(L) 121(L)  123(L)  Potassium 3.5 - 5.1 mmol/L - 3.7 3.7  Chloride 98 - 111 mmol/L - 93(L) 95(L)  CO2 22 - 32 mmol/L - 19(L) 20(L)  Calcium 8.9 - 10.3 mg/dL - 8.0(L) 7.8(L)  Total Protein 6.5 - 8.1 g/dL - - -  Total Bilirubin 0.3 - 1.2 mg/dL - - -  Alkaline Phos 38 - 126 U/L - - -  AST 15 - 41 U/L - - -  ALT 0 - 44 U/L - - -       Micro Results Recent Results (from the past 240 hour(s))  SARS Coronavirus 2 by RT PCR (hospital order, performed in [facility] hospital lab) Nasopharyngeal Nasopharyngeal Swab     Status: None   Collection Time: 04/06/20  4:00 PM   Specimen: Nasopharyngeal Swab  Result Value Ref Range Status   SARS Coronavirus 2 NEGATIVE NEGATIVE Final    Comment: (NOTE) SARS-CoV-2 target nucleic acids are NOT DETECTED.  The SARS-CoV-2 RNA is generally detectable in upper and lower respiratory specimens during the acute phase of infection. The lowest concentration of SARS-CoV-2 viral copies this assay can detect is 250 copies / mL. A negative result does not preclude SARS-CoV-2 infection and should not be used as the sole basis for treatment or other patient management decisions.  A negative result may occur with improper specimen collection / handling, submission of specimen other than nasopharyngeal swab, presence of viral mutation(s) within the areas targeted by this assay, and inadequate number of viral copies (<250 copies / mL). A negative result must be combined with clinical observations, patient history, and epidemiological information.  Fact Sheet for Patients:   BoilerBrush.com.cy  Fact Sheet for Healthcare Providers: https://pope.com/  This test is not yet approved or  cleared by the [place] FDA and has been authorized for detection and/or diagnosis of SARS-CoV-2 by FDA under an Emergency Use Authorization (EUA).  This EUA will remain in effect (meaning this test can be used) for the duration of  the COVID-19 declaration under Section 564(b)(1) of the Act, 21  U.S.C. section 360bbb-3(b)(1), unless the authorization is terminated or revoked sooner.  Performed at [facility], [address] [place], [place] [address]   MRSA PCR Screening     Status: None   Collection Time: 04/08/20  6:30 PM   Specimen: Nasopharyngeal  Result Value Ref Range Status   MRSA by PCR NEGATIVE NEGATIVE Final    Comment:        The GeneXpert MRSA Assay (FDA approved for NASAL specimens only), is one component of a comprehensive MRSA colonization surveillance program. It is not intended to diagnose MRSA infection nor to guide or monitor treatment for MRSA infections. Performed at [facility], [address], [place], [place] [address]     Radiology Reports DG Chest 2 View  Result Date: 03/24/2020 CLINICAL DATA:  Altered mental status. EXAM: CHEST - 2 VIEW COMPARISON:  12/15/2019. FINDINGS: Mediastinum hilar structures normal. Stable cardiomegaly. No pulmonary venous congestion. No focal infiltrate. No pleural effusion or pneumothorax. Degenerative changes scoliosis thoracic spine. Degenerative changes both shoulders. Stable sclerotic changes right proximal humerus most likely secondary to infarct or enchondroma. IMPRESSION: Stable cardiomegaly. No acute cardiopulmonary disease. Previously identified pulmonary venous congestion has cleared. Electronically Signed   By: Maisie Fus  Register   On: 03/24/2020 10:39   DG Abd 1 View  Result Date: 04/09/2020 CLINICAL DATA:  In G-tube placement EXAM: ABDOMEN - 1 VIEW COMPARISON:  None. FINDINGS: Enteric tube terminates in the proximal stomach overlying the gastric fundus. No evidence of pneumatosis or pneumoperitoneum. IMPRESSION: Enteric tube terminates in the proximal stomach overlying the gastric fundus. Electronically Signed   By: Delbert Phenix M.D.   On: 04/09/2020 16:10   CT Head Wo Contrast  Result Date: 04/06/2020 CLINICAL DATA:   Dysphagia. EXAM: CT HEAD WITHOUT CONTRAST TECHNIQUE: Contiguous axial images were obtained from the base of the skull through the vertex without intravenous contrast. COMPARISON:  March 24, 2020 FINDINGS: Brain: There is mild to moderate severity cerebral atrophy with widening of the extra-axial spaces and ventricular dilatation. There are areas of decreased attenuation within the white matter tracts of the supratentorial brain, consistent with microvascular disease changes. 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 Vascular: No hyperdense vessel or unexpected calcification. Skull: Normal. Negative for fracture or focal lesion. Sinuses/Orbits: No acute finding. Other: None. IMPRESSION: 1. Generalized cerebral atrophy. 2. No acute intracranial abnormality. Electronically Signed   By: Aram Candela M.D.   On: 04/06/2020 15:58   CT Head Wo Contrast  Result Date: 03/24/2020 CLINICAL DATA:  Weakness, altered mental status EXAM: CT HEAD WITHOUT CONTRAST TECHNIQUE: Contiguous axial images were obtained from the base of the skull through the vertex without intravenous contrast. COMPARISON:  12/06/2019 FINDINGS: Brain: No evidence of acute infarction, hemorrhage, hydrocephalus, extra-axial collection or mass lesion/mass effect. 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 Moderate low-density changes within the periventricular and subcortical white matter compatible with chronic microvascular ischemic change. Moderate diffuse cerebral volume loss. Vascular: Atherosclerotic calcifications involving the large vessels of the skull base. No unexpected hyperdense vessel. Skull: Normal. Negative for fracture or focal lesion. Sinuses/Orbits: No acute finding. Other: None. IMPRESSION: 1. No acute intracranial findings. 2. Chronic microvascular ischemic change and cerebral volume loss. Electronically Signed   By: Duanne Guess D.O.   On: 03/24/2020 13:55   CT ABDOMEN PELVIS W CONTRAST  Result  Date: 03/24/2020 CLINICAL DATA:  Buttock wound. EXAM: CT ABDOMEN AND PELVIS WITH CONTRAST TECHNIQUE: Multidetector CT imaging of the abdomen and pelvis  was performed using the standard protocol following bolus administration of intravenous contrast. CONTRAST:  75mL OMNIPAQUE IOHEXOL 300 MG/ML  SOLN COMPARISON:  Abdominal ultrasound 12/07/2019 FINDINGS: The study is mildly motion degraded. Lower chest: Minimal atelectasis or scarring in the lung bases. No pleural effusion. Three-vessel coronary atherosclerosis. Hepatobiliary: No focal liver abnormality is seen. Unremarkable gallbladder. No biliary dilatation. Pancreas: Unremarkable. Spleen: Unremarkable. Adrenals/Urinary Tract: Unremarkable adrenal glands. No evidence of renal mass or hydronephrosis. Punctate nonobstructing calculus or vascular calcification in the right renal hilum. Unremarkable bladder. Stomach/Bowel: The stomach is unremarkable. There is a moderate amount of stool in the rectum with a small amount of scattered stool in the colon. There is no evidence of bowel obstruction. Evaluation for bowel inflammation is limited by motion, paucity of abdominal fat, and absence of oral contrast. Unremarkable appendix. Vascular/Lymphatic: Abdominal aortic atherosclerosis without aneurysm. Heavily calcified plaque at the celiac, superior mesenteric, and bilateral renal artery origins with potentially flow limiting stenoses. No enlarged lymph nodes. Reproductive: Mildly enlarged prostate. Other: No ascites or pneumoperitoneum. Right buttock soft tissue ulceration inferior to the ischium with surrounding soft tissue thickening and stranding extending into the included portion of the right thigh and perineum. No fluid collection, dissecting subcutaneous emphysema, or regional osseous destruction identified. 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 IMPRESSION: 1. Inferior right buttock ulceration with surrounding inflammation extending into the  included portion of the right thigh and perineum. No abscess. No evidence of necrotizing fasciitis or osteomyelitis. 2. Moderate rectal stool burden. 3. Aortic Atherosclerosis (ICD10-I70.0). Electronically Signed   By: Sebastian Ache M.D.   On: 03/24/2020 14:00   DG Chest Portable 1 View  Result Date: 04/06/2020 CLINICAL DATA:  Cough. EXAM: PORTABLE CHEST 1 VIEW COMPARISON:  March 24, 2020 FINDINGS: The lungs are mildly hyperinflated. Mild diffuse chronic appearing increased lung markings are seen. There is no evidence of acute infiltrate, pleural effusion or pneumothorax. The heart size and mediastinal contours are within normal limits. The visualized skeletal structures are unremarkable. IMPRESSION: Chronic appearing increased lung markings without evidence of acute or active cardiopulmonary disease. Electronically Signed   By: Aram Candela M.D.   On: 04/06/2020 15:54   [place] EKG SITE RITE  Result Date: 04/08/2020 If Site Rite image not attached, placement could not be confirmed due to current cardiac rhythm.   SIGNED: Kendell Bane, MD, FACP, FHM. [facility],  Pager (please use [email or web address] to page/text)  If 7PM-7AM, please contact night-coverage [email or web address] 04/10/2020, 12:40 PM   Critical care management, greater than 55 minutes-was spent evaluating seeing patient reviewing records placing order drawing plan of care with consultants, calling consultants.

## 2020-04-10 NOTE — Progress Notes (Signed)
Dr. Flossie Dibble notified that pt pulled out NGT and refused to have it put back in. No new orders at this time. Awaiting palliative consult for goals of care.

## 2020-04-10 NOTE — Consult Note (Signed)
Consultation Note Date: 04/10/2020   Patient Name: Javier Robinson  DOB: April 13, 1946  MRN: [id number]  Age / Sex: 74 y.o., male  PCP: Charlott Rakes, MD Referring Physician: Kendell Bane, MD  Reason for Consultation: Establishing goals of care and Psychosocial/spiritual support  HPI/Patient Profile: 74 y.o. male admitted on 04/06/2020 with   PMH of chronic alcohol use, HTN, recent admission for sepsis and malnutrition who presents from ALF for difficulty swallowing.    BMP with profound hyponatremia ( Na 110) , acidemia, hypoglycemia. Suspect related to poor PO intake.   Overall failure to thrive.  Patient is lethargic and does not have medical decision capacity at this time.  Per family patient has had continued physical, functional and cognitive decline since February.  Family face treatment option decisions, advanced directive decisions and anticipatory care needs.   Clinical Assessment and Goals of Care:  This NP Lorinda Creed reviewed medical records, received report from team, assessed the patient and then meet at the patient's bedside  along with niece/main support person Rodman Comp # [phone] and by phone with sister Jonna Clark Branch/patient's sister  to discuss diagnosis, prognosis, GOC, EOL wishes disposition and options.   Concept of Palliative Care was introduced as specialized medical care for people and their families living with serious illness.  If focuses on providing relief from the symptoms and stress of a serious illness.  The goal is to improve quality of life for both the patient and the family.     A  discussion was had today regarding advanced directives.  Concepts specific to code status, artifical feeding and hydration, continued IV antibiotics and rehospitalization was had.  The difference between a aggressive medical intervention path  and a palliative comfort care path  for this patient at this time was had.  Values and goals of care important to patient and family were attempted to be elicited.   MOST form completed to reflect full comfort.  Rodman Comp signs the form as an individual with an established relationship with the patient who is acting in good faith and can reliably convey the wishes of the patient.   Natural trajectory and expectations at EOL were discussed.  Questions and concerns addressed.  Patient  encouraged to call with questions or concerns.     PMT will continue to support holistically.          There is no documented healthcare power of attorney or advanced directives.  I spoke today with patient's 2 main support persons, Rodman Comp and [employer].  Does tell me that patient does have children who are estranged and have not participated in the patient's life for many years.  Verlon Au provided me with Bronson Ing Kimber/daughter phone number# [phone], I was able to speak with Bronson Ing and indeed she tells me that both Rodman Comp and Deforest Hoyles are the main decision makers for this patient.  He did not want any information regarding Mr. Govan and did not want to be contacted in the future.  SUMMARY OF RECOMMENDATIONS    Code Status/Advance Care Planning:  DNR /DNI  No artificial feeding or hydration now or in the future  Comfort feeds as tolerated  No further diagnostics  Minimize medications, no antibiotic use  Symptom management  Allow for natural death   Symptom Management:   Pain/dyspnea: Roxanol 5 mg p.o./sublingual every 2 hours as needed  Agitation: Ativan 1 mg p.o./sublingual every 4 hours as needed  Palliative Prophylaxis:   Eye Care, Frequent Pain Assessment and Oral Care  Additional Recommendations (Limitations, Scope, Preferences):  Full Comfort Care  Psycho-social/Spiritual:   Desire for further Chaplaincy support:yes  Additional Recommendations: Education on Hospice  Prognosis:    Evaluate in the morning for an accurate prognosis  Discharge Planning: Reevaluate in the morning, if patient is appropriate for residential hospice offered to family.  Otherwise return to SNF with hospice.    To Be Determined      Primary Diagnoses: Present on Admission: . Hyponatremia . Alcohol dependence (HCC) . Hypoglycemia . Essential hypertension . Protein-calorie malnutrition, severe . Metabolic acidosis, increased anion gap (IAG)   I have reviewed the medical record, interviewed the patient and family, and examined the patient. The following aspects are pertinent.  Past Medical History:  Diagnosis Date  . Arthritis   . Pain    BACK. no specific injury   . Seizure (HCC) 2000   IN THE PAST. per patient, it was when he was drinking   Social History   Socioeconomic History  . Marital status: Single    Spouse name: eve  . Number of children: Not on file  . Years of education: Not on file  . Highest education level: Not on file  Occupational History  . Occupation: Development worker, international aid    Comment: retired  Tobacco Use  . Smoking status: Current Every Day Smoker    Packs/day: 0.50    Types: Cigarettes  . Smokeless tobacco: Never Used  Vaping Use  . Vaping Use: Never used  Substance and Sexual Activity  . Alcohol use: Yes    Comment: had 1/2 pint of moonshine in last 24 hours. drinks that daily  . Drug use: Never  . Sexual activity: Not on file  Other Topics Concern  . Not on file  Social History Narrative  . Not on file   Social Determinants of Health   Financial Resource Strain:   . Difficulty of Paying Living Expenses: Not on file  Food Insecurity:   . Worried About Programme researcher, broadcasting/film/video in the Last Year: Not on file  . Ran Out of Food in the Last Year: Not on file  Transportation Needs:   . Lack of Transportation (Medical): Not on file  . Lack of Transportation (Non-Medical): Not on file  Physical Activity:   . Days of Exercise per Week: Not on file   . Minutes of Exercise per Session: Not on file  Stress:   . Feeling of Stress : Not on file  Social Connections:   . Frequency of Communication with Friends and Family: Not on file  . Frequency of Social Gatherings with Friends and Family: Not on file  . Attends Religious Services: Not on file  . Active Member of Clubs or Organizations: Not on file  . Attends Banker Meetings: Not on file  . Marital Status: Not on file   No family history on file. Scheduled Meds: . Chlorhexidine Gluconate Cloth  6 each Topical Daily  . enoxaparin (LOVENOX) injection  40  mg Subcutaneous Q24H  . ferrous sulfate  325 mg Per Tube TID  . folic acid  1 mg Oral Daily  . mouth rinse  15 mL Mouth Rinse BID  . multivitamin with minerals  1 tablet Oral Daily  . tamsulosin  0.4 mg Oral QPC supper  . thiamine  100 mg Oral Daily   Continuous Infusions: . sodium chloride (hypertonic) 40 mL/hr at 04/10/20 1403   PRN Meds:. Medications Prior to Admission:  Prior to Admission medications   Medication Sig Start Date End Date Taking? Authorizing Provider  amLODipine (NORVASC) 5 MG tablet Take 1 tablet (5 mg total) by mouth daily. 12/16/19  Yes Dhungel, Nishant, MD  ferrous sulfate 325 (65 FE) MG tablet Take 1 tablet (325 mg total) by mouth 3 (three) times daily with meals. 03/29/20 04/28/20 Yes Sreenath, Sudheer B, MD  folic acid (FOLVITE) 1 MG tablet Take 1 tablet (1 mg total) by mouth daily. 12/16/19  Yes Dhungel, Nishant, MD  Multiple Vitamin (MULTIVITAMIN WITH MINERALS) TABS tablet Take 1 tablet by mouth daily. 12/16/19  Yes Dhungel, Nishant, MD  nicotine (NICODERM CQ - DOSED IN MG/24 HOURS) 14 mg/24hr patch Place 1 patch (14 mg total) onto the skin daily. 03/30/20  Yes Sreenath, Sudheer B, MD  Nutritional Supplements (NEPRO) LIQD Take 1 Bottle by mouth 3 (three) times daily with meals. 12/15/19  Yes Dhungel, Nishant, MD  sodium chloride 1 g tablet Take 1 tablet (1 g total) by mouth 3 (three) times daily  with meals. 12/15/19  Yes Dhungel, Nishant, MD  tamsulosin (FLOMAX) 0.4 MG CAPS capsule Take 1 capsule (0.4 mg total) by mouth daily after supper. 12/15/19  Yes Dhungel, Nishant, MD  thiamine 100 MG tablet Take 1 tablet (100 mg total) by mouth daily. 12/16/19  Yes Dhungel, Nishant, MD   No Known Allergies Review of Systems  Unable to perform ROS: Mental status change    Physical Exam Constitutional:      Appearance: He is underweight. He is ill-appearing.  Cardiovascular:     Rate and Rhythm: Normal rate.  Skin:    General: Skin is dry.  Neurological:     Mental Status: He is lethargic.     Vital Signs: BP 130/68 (BP Location: Left Arm)   Pulse 87   Temp 98 F (36.7 C) (Axillary)   Resp 20   Ht 5\' 7"  (1.702 m)   Wt 60.8 kg   SpO2 100%   BMI 20.99 kg/m  Pain Scale: CPOT       SpO2: SpO2: 100 % O2 Device:SpO2: 100 % O2 Flow Rate: .   IO: Intake/output summary:   Intake/Output Summary (Last 24 hours) at 04/10/2020 1437 Last data filed at 04/10/2020 1403 Gross per 24 hour  Intake 2115.02 ml  Output 1100 ml  Net 1015.02 ml    LBM: Last BM Date:  (PTA) Baseline Weight: Weight: 63.5 kg Most recent weight: Weight: 60.8 kg     Palliative Assessment/Data:30 % at best   Discussed with Dr 01-19-2005  Time In: 1320 Time Out: 1430 Time Total: 70 minutes Greater than 50%  of this time was spent counseling and coordinating care related to the above assessment and plan.  Signed by: 1321, NP   Please contact Palliative Medicine Team phone at [phone] for questions and concerns.  For individual provider: See [phone]
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
## 2020-04-10 NOTE — Progress Notes (Signed)
Pt pulled NGT out at this time. Attempted to replace NGT but pt was hitting and yelling "do not touch me". Tube feeds were turned off. Nephrology at bedside and wants to continue 3% saline at this time.

## 2020-04-10 NOTE — Progress Notes (Signed)
Pt transferred to RM # [id number] at this time. Pt is full comfort care. Report given to receiving RN.

## 2020-04-10 NOTE — Progress Notes (Signed)
Nutrition Follow-up  DOCUMENTATION CODES:   Severe malnutrition in context of social or environmental circumstances  INTERVENTION:  Will monitor for outcome of SLP evaluation and conversations regarding goals of care and make appropriate recommendations.  NUTRITION DIAGNOSIS:   Severe Malnutrition related to social / environmental circumstances (suspected inadequate oral intake, dysphagia, hx EtOH use) as evidenced by severe fat depletion, severe muscle depletion.  New nutrition diagnosis.  GOAL:   Patient will meet greater than or equal to 90% of their needs  Not met.  MONITOR:   Diet advancement, Labs, Weight trends, I & O's, Skin  REASON FOR ASSESSMENT:   Consult Assessment of nutrition requirement/status, Poor PO, Enteral/tube feeding initiation and management  ASSESSMENT:   74 year old male with PMHx of arthritis, seizures, EtOH use admitted from [place] with metabolic acidosis, metabolic encephalopathy, dehydration.  Assessed patient at bedside this morning. He was resting in bed. Unable to provide any history. No family members at bedside. An NGT was able to be placed yesterday and adult TF protocol was started. However patient was agitated this morning and pulled NGT. He refused replacement and per chart was hitting and yelling "do not touch me." Still pending SLP evaluation and Palliative Medicine consult. RD will continue to monitor for plan of care and outcome of goals of care discussions.  Medications reviewed and include: hypertonic saline at 40 ml/hr.  Labs reviewed: CBG 75-96, Sodium 125.  NUTRITION - FOCUSED PHYSICAL EXAM:    Most Recent Value  Orbital Region Severe depletion  Upper Arm Region Severe depletion  Thoracic and Lumbar Region Moderate depletion  Buccal Region Severe depletion  Temple Region Severe depletion  Clavicle Bone Region Severe depletion  Clavicle and Acromion Bone Region Severe depletion  Scapular Bone Region Moderate  depletion  Dorsal Hand Severe depletion  Patellar Region Severe depletion  Anterior Thigh Region Severe depletion  Posterior Calf Region Moderate depletion  Edema (RD Assessment) None  Hair Reviewed  Eyes Reviewed  Mouth Unable to assess  Skin Reviewed  Nails Reviewed     Diet Order:   Diet Order            Diet NPO time specified  Diet effective now                EDUCATION NEEDS:   No education needs have been identified at this time  Skin:  Skin Assessment: Skin Integrity Issues: Skin Integrity Issues:: Stage II, Unstageable, Other (Comment) Stage II: sacrum, right buttocks, right hip Unstageable: buttocks Other: Non-pressure wound right elbow  Last BM:  Unknown  Height:   Ht Readings from Last 1 Encounters:  04/08/20 _0  (1.702 m)   Weight:   Wt Readings from Last 1 Encounters:  04/10/20 60.8 kg   BMI:  Body mass index is 20.99 kg/m.  Estimated Nutritional Needs:   Kcal:  1600-1800  Protein:  80-90 grams  Fluid:  1.5 L/day  Jacklynn Barnacle, MS, RD, LDN Pager number available on Amion

## 2020-04-11 DIAGNOSIS — Z515 Encounter for palliative care: Secondary | ICD-10-CM

## 2020-04-11 DIAGNOSIS — Z66 Do not resuscitate: Secondary | ICD-10-CM

## 2020-04-11 DIAGNOSIS — R627 Adult failure to thrive: Secondary | ICD-10-CM

## 2020-04-11 LAB — SARS CORONAVIRUS 2 BY RT PCR (HOSPITAL ORDER, PERFORMED IN ~~LOC~~ HOSPITAL LAB): SARS Coronavirus 2: NEGATIVE

## 2020-04-11 MED ORDER — MORPHINE SULFATE (CONCENTRATE) 10 MG/0.5ML PO SOLN
5.0000 mg | ORAL | 0 refills | Status: AC | PRN
Start: 2020-04-11 — End: 2020-04-14

## 2020-04-11 MED ORDER — LORAZEPAM 1 MG PO TABS: 1.0000 mg | ORAL_TABLET | ORAL | 0 refills | Status: AC | PRN

## 2020-04-11 MED ORDER — CHLORHEXIDINE GLUCONATE CLOTH 2 % EX PADS: 6.0000 | MEDICATED_PAD | Freq: Every day | CUTANEOUS | 0 refills | Status: DC

## 2020-04-11 MED ORDER — CHLORHEXIDINE GLUCONATE CLOTH 2 % EX PADS: 6.0000 | MEDICATED_PAD | Freq: Every day | CUTANEOUS | 0 refills | Status: AC

## 2020-04-11 NOTE — Care Management Important Message (Signed)
Important Message  Patient Details  Name: Javier Robinson MRN: [id number] Date of Birth: 1946/05/12   Medicare Important Message Given:  Yes     Johnell Comings 04/11/2020, 11:29 AM

## 2020-04-11 NOTE — Progress Notes (Signed)
[facility] transported patient to [facility] without incident. IV's removed and patient is going to rm 30A.  [name] 04/11/2020  9:53 PM

## 2020-04-11 NOTE — Plan of Care (Signed)
Patient comfort care DNR and discharging to [facility] with Hospice.  Report call to [facility] who verbalized understanding and acceptance.  COVID screen neg.  PIV x2 removed.  Dressing changed and patient assisted to get ready.  EMS notified for transport to facility.

## 2020-04-11 NOTE — Progress Notes (Signed)
PROGRESS NOTE    Patient: Javier Robinson                            PCP: Maryella Shivers, MD                    DOB: 1946/08/10            DOA: 04/06/2020 XIP:[id number]             DOS: 04/11/2020, 1:20 PM   LOS: 5 days   Date of Service: The patient was seen and examined on 04/11/2020  Subjective:    The patient was seen and examined for, comfortably sleeping. Seem to be unresponsive but respond to pain stimuli  Met with the family yesterday concerning poor prognosis comfort care measures Family met with the palliative care team >>>> family is agreed to comfort care measures, evaluate for hospice today   Brief Narrative:   HPI: Javier Robinson 74 yo M with PMHx  chronic alcohol use, HTN, recent admission for sepsis and malnutrition who presents from ALF apparently for difficulty swallowing,  generalized weakness, confusion, difficulty . Reviewed records, pt was just hospitalized for sepsis, d/c to SNF. No apparent focal deficits on exam. CT Head shows NAICA. CXR w/o PNA or apparent major aspiration.  Apparently mental status is at baseline per report. Per report from facility [facility])  Labs as above. BMP with profound hyponatremia ( Na 110) , acidemia, hypoglycemia. Suspect related to poor PO intake. NS at 100.   Assessment & Plan:   Active Problems:   Metabolic acidosis, increased anion gap (IAG)   Hypoglycemia   Hyponatremia   Alcohol dependence (HCC)   Essential hypertension   Protein-calorie malnutrition, severe   Microcytic anemia   Adult failure to thrive   Palliative care by specialist   DNR (do not resuscitate)  Status post ICU transfer yesterday Family met with palliative care team We have met with family yesterday, along with palliative care team, nursing staff, nephrology All in agreement for patient to proceed with comfort care measures  Patient will be reassessed by palliative care team today for possible hospice-hospice home    Hyponatremia  and Hypochloremia with Non Anion Gap Metabolic Acidosis  -Sees all lab work, DC all IV fluid resuscitation   -NG tube was placed hoping sodium level improved with tube feeds, unfortunately he pulled it out this a.m. -Nephrology following restart the patient on 3% saline -Multifactorial including poor p.o. intake, dehydration --- SIADH -Appreciating nephrology assist, -Sodium on admission 108, 109, 112, 114, 112, 115 >> 119 >>> 122, 123... 125,125  now -Discontinued hypertonic suppression and supplement IV fluids    On admission Na  110. Cl  79. Bicarb 17. Patient appeared hypovolemic on admission. Does appear that he sometimes has chronic mild hyponatremia in the low 130s, likely compounded by alcohol use. Not on thiazide diuretic or other offending agents. CT head was negative. Appears to be at baseline for mental status.    Metabolic encephalopathy  -Baseline cognition, insight cannot be determined, recent history of stroke Family member stating patient was verbal prior to admission -Patient has had a recent stroke -Verbal communication interaction and insight still compromised due to poor hydration, stroke, hyponatremia -Avoided cautiously to prevent overcorrection or rapid correction of sodium   Poor p.o. intake/dehydration/hypoglycemia  Poor prognosis >>> comfort care measures  -Unable to completed dysphagia evaluation as patient remains encephalopathic -NG tube was  placed x2 by ICU staff-patient become agitated pulled out this morning -Discontinued IV fluid resuscitation -Episodic hypoglycemia, will be addressed with D10/D50  as needed Blood sugar is improved, D10 NS was initiated on admission glucose 49. -Likely related to poor PO intake.  -Discontinue CBG checks  Severe malnutrition-poor p.o. intake/dysphagia (in the setting of previous stroke) -Speech will be consulted for evaluation -But at this time patient remains encephalopathic -With attempt of NG tube placement  patient become agitated --nursing staff was not able to place the NG tube -We will wait till encephalopathy improves before reevaluation -Family has been made aware of poor p.o. intake, prognosis with comorbidities remains very poor  HTN /hypotension -Discontinuing meds   Microcytic Anemia  HgB is 9.7, >>> 8.8  >> 8.7 >>  -improved from recent hospitalization with value of 7.5. MCV low.  -We will not monitor anymore  Alcohol Dependence  -No signs of withdrawal, no seizure episodes, vitals remained stable Unclear if he still using alcohol-  has been at ALF.  -monitor closely, start CIWA as needed  -Discontinuing IV vitamins   Hypokalemia --Monitoring closely, anticipating improvement with IVF LR  Ethics -DNR/DNI confirmed - due to multiple comorbidities, continued metabolic encephalopathy poor p.o. intake, severe hyponatremia malnutrition, recent stroke --- prognosis remain grim Family was updated was informed of the findings-agreed to comfort care measures, Will further evaluate today 04/11/2020 if patient is candidate for hospice home Palliative care consulted -appreciate assistance Reaching out to family once again [name]  [phone]   Nutritional status:  Nutrition Problem: Severe Malnutrition Etiology: social / environmental circumstances (suspected inadequate oral intake, dysphagia, hx EtOH use) Signs/Symptoms: severe fat depletion, severe muscle depletion Interventions: Refer to RD note for recommendations  Consultants: Nephrology PCCM Palliative care consulted   ---------------------------------------------------------------------------------------------------------------------------------------------  DVT prophylaxis:  SCD/Compression stockings and Lovenox SQ Code Status:   Code Status: DNR Family Communication: [name]  [phone] No family member present at bedside- Called Niece at [phone] -- updated  Discussed in detail including goal of  care and poor prognosis--  She expressed understanding, agreeable to palliative care consultation.    Admission status:    Status is: Inpatient  Remains inpatient appropriate because:Inpatient level of care appropriate due to severity of illness   Dispo: The patient is from: SNF              Anticipated d/c is to: SNF              Anticipated d/c date is: 3 days              Patient currently is not medically stable to d/c.   Critical care time greater than 55 minutes was spent in ICU setting seen evaluate patient, determine plan of care, reviewing meds, history, electronic records, discussing plan of care with consultants, ICU nursing staff, Consultation, determine goal of care, contacting family members extensive discussion regarding goals of care and CODE STATUS.     Procedures:   No admission procedures for hospital encounter.     Antimicrobials:  Anti-infectives (From admission, onward)   None       Medication:  . Chlorhexidine Gluconate Cloth  6 each Topical Daily  . mouth rinse  15 mL Mouth Rinse BID  . tamsulosin  0.4 mg Oral QPC supper       Objective:   Vitals:   04/10/20 1400 04/10/20 1500 04/10/20 1600 04/10/20 2008  BP: 130/68 133/66  (!) 144/49  Pulse: 87 93 81 80  Resp:  '20 14 18 20  ' Temp: 98 F (36.7 C)   98.3 F (36.8 C)  TempSrc: Axillary   Oral  SpO2: 100% 97% 98% 94%  Weight:      Height:        Intake/Output Summary (Last 24 hours) at 04/11/2020 1320 Last data filed at 04/11/2020 0900 Gross per 24 hour  Intake 112.57 ml  Output 1100 ml  Net -987.43 ml   Filed Weights   04/06/20 1456 04/08/20 1828 04/10/20 0145  Weight: 63.5 kg 61 kg 60.8 kg     Examination:      Physical Exam:   General:   Currently unresponsive--response to pain stimuli only  HEENT:  Normocephalic, PERRL, otherwise with in Normal limits   Neuro:  CNII-XII intact. , normal motor and sensation, reflexes intact   Lungs:   Clear to auscultation BL,  Respirations unlabored, no wheezes / crackles  Cardio:    S1/S2, RRR, No murmure, No Rubs or Gallops   Abdomen:   Soft, non-tender, bowel sounds active all four quadrants,  no guarding or peritoneal signs.  Muscular skeletal:  Limited exam - in bed,  No pitting edema  Skin:  Dry, warm to touch, negative for any Rashes, No open wounds  Wounds: Please see nursing documentation Pressure Injury 11/14/19 Hip Right Stage 2 -  Partial thickness loss of dermis presenting as a shallow open injury with a red, pink wound bed without slough. open wound (Active)  11/14/19 1110  Location: Hip  Location Orientation: Right  Staging: Stage 2 -  Partial thickness loss of dermis presenting as a shallow open injury with a red, pink wound bed without slough.  Wound Description (Comments): open wound  Present on Admission:      Pressure Injury 11/14/19 Thigh Posterior;Proximal Stage 1 -  Intact skin with non-blanchable redness of a localized area usually over a bony prominence. (Active)  11/14/19 1114  Location: Thigh  Location Orientation: Posterior;Proximal  Staging: Stage 1 -  Intact skin with non-blanchable redness of a localized area usually over a bony prominence.  Wound Description (Comments):   Present on Admission:      Pressure Injury 03/24/20 Buttocks Right;Left;Mid Unstageable - Full thickness tissue loss in which the base of the injury is covered by slough (yellow, tan, [place], green or brown) and/or eschar (tan, brown or black) in the wound bed. Pt has two pressure i (Active)  03/24/20 1200  Location: Buttocks  Location Orientation: Right;Left;Mid  Staging: Unstageable - Full thickness tissue loss in which the base of the injury is covered by slough (yellow, tan, [place], green or brown) and/or eschar (tan, brown or black) in the wound bed.  Wound Description (Comments): Pt has two pressure injuries on sacrum. Right sided pressure injury is unstageable   Present on Admission: Yes     Pressure Injury  03/24/20 Sacrum Medial Stage 2 -  Partial thickness loss of dermis presenting as a shallow open injury with a red, pink wound bed without slough. (Active)  03/24/20 2230  Location: Sacrum  Location Orientation: Medial  Staging: Stage 2 -  Partial thickness loss of dermis presenting as a shallow open injury with a red, pink wound bed without slough.  Wound Description (Comments):   Present on Admission: Yes     Pressure Injury 03/24/20 Buttocks Right Stage 2 -  Partial thickness loss of dermis presenting as a shallow open injury with a red, pink wound bed without slough. (Active)  03/24/20 2230  Location: Buttocks  Location Orientation: Right  Staging: Stage 2 -  Partial thickness loss of dermis presenting as a shallow open injury with a red, pink wound bed without slough.  Wound Description (Comments):   Present on Admission: Yes     Pressure Injury 03/24/20 Hip Right Stage 2 -  Partial thickness loss of dermis presenting as a shallow open injury with a red, pink wound bed without slough. (Active)  03/24/20 2230  Location: Hip  Location Orientation: Right  Staging: Stage 2 -  Partial thickness loss of dermis presenting as a shallow open injury with a red, pink wound bed without slough.  Wound Description (Comments):   Present on Admission: Yes     ------------------------------------------------------------------------------------------------------------------------------------------    LABs:  CBC Latest Ref Rng & Units 04/10/2020 04/09/2020 04/08/2020  WBC 4.0 - 10.5 K/uL 5.7 6.5 7.8  Hemoglobin 13.0 - 17.0 g/dL 8.3(L) 8.7(L) 8.6(L)  Hematocrit 39 - 52 % 23.2(L) 24.4(L) 23.9(L)  Platelets 150 - 400 K/uL 259 283 355   CMP Latest Ref Rng & Units 04/10/2020 04/10/2020 04/10/2020  Glucose 70 - 99 mg/dL - - 99  BUN 8 - 23 mg/dL - - <5(L)  Creatinine 0.61 - 1.24 mg/dL - - <0.30(L)  Sodium 135 - 145 mmol/L 125(L) 125(L) 121(L)  Potassium 3.5 - 5.1 mmol/L - - 3.7  Chloride 98 - 111 mmol/L  - - 93(L)  CO2 22 - 32 mmol/L - - 19(L)  Calcium 8.9 - 10.3 mg/dL - - 8.0(L)  Total Protein 6.5 - 8.1 g/dL - - -  Total Bilirubin 0.3 - 1.2 mg/dL - - -  Alkaline Phos 38 - 126 U/L - - -  AST 15 - 41 U/L - - -  ALT 0 - 44 U/L - - -       Micro Results Recent Results (from the past 240 hour(s))  SARS Coronavirus 2 by RT PCR (hospital order, performed in [facility] hospital lab) Nasopharyngeal Nasopharyngeal Swab     Status: None   Collection Time: 04/06/20  4:00 PM   Specimen: Nasopharyngeal Swab  Result Value Ref Range Status   SARS Coronavirus 2 NEGATIVE NEGATIVE Final    Comment: (NOTE) SARS-CoV-2 target nucleic acids are NOT DETECTED.  The SARS-CoV-2 RNA is generally detectable in upper and lower respiratory specimens during the acute phase of infection. The lowest concentration of SARS-CoV-2 viral copies this assay can detect is 250 copies / mL. A negative result does not preclude SARS-CoV-2 infection and should not be used as the sole basis for treatment or other patient management decisions.  A negative result may occur with improper specimen collection / handling, submission of specimen other than nasopharyngeal swab, presence of viral mutation(s) within the areas targeted by this assay, and inadequate number of viral copies (<250 copies / mL). A negative result must be combined with clinical observations, patient history, and epidemiological information.  Fact Sheet for Patients:   StrictlyIdeas.no  Fact Sheet for Healthcare Providers: BankingDealers.co.za  This test is not yet approved or  cleared by the [place] FDA and has been authorized for detection and/or diagnosis of SARS-CoV-2 by FDA under an Emergency Use Authorization (EUA).  This EUA will remain in effect (meaning this test can be used) for the duration of the COVID-19 declaration under Section 564(b)(1) of the Act, 21 U.S.C. section 360bbb-3(b)(1),  unless the authorization is terminated or revoked sooner.  Performed at [facility], [address] [place], [place] [address]   MRSA PCR Screening  Status: None   Collection Time: 04/08/20  6:30 PM   Specimen: Nasopharyngeal  Result Value Ref Range Status   MRSA by PCR NEGATIVE NEGATIVE Final    Comment:        The GeneXpert MRSA Assay (FDA approved for NASAL specimens only), is one component of a comprehensive MRSA colonization surveillance program. It is not intended to diagnose MRSA infection nor to guide or monitor treatment for MRSA infections. Performed at [facility], [address], [place], [place] [address]     Radiology Reports DG Chest 2 View  Result Date: 03/24/2020 CLINICAL DATA:  Altered mental status. EXAM: CHEST - 2 VIEW COMPARISON:  12/15/2019. FINDINGS: Mediastinum hilar structures normal. Stable cardiomegaly. No pulmonary venous congestion. No focal infiltrate. No pleural effusion or pneumothorax. Degenerative changes scoliosis thoracic spine. Degenerative changes both shoulders. Stable sclerotic changes right proximal humerus most likely secondary to infarct or enchondroma. IMPRESSION: Stable cardiomegaly. No acute cardiopulmonary disease. Previously identified pulmonary venous congestion has cleared. Electronically Signed   By: Marcello Moores  Register   On: 03/24/2020 10:39   DG Abd 1 View  Result Date: 04/09/2020 CLINICAL DATA:  In G-tube placement EXAM: ABDOMEN - 1 VIEW COMPARISON:  None. FINDINGS: Enteric tube terminates in the proximal stomach overlying the gastric fundus. No evidence of pneumatosis or pneumoperitoneum. IMPRESSION: Enteric tube terminates in the proximal stomach overlying the gastric fundus. Electronically Signed   By: Ilona Sorrel M.D.   On: 04/09/2020 16:10   CT Head Wo Contrast  Result Date: 04/06/2020 CLINICAL DATA:  Dysphagia. EXAM: CT HEAD WITHOUT CONTRAST TECHNIQUE: Contiguous axial images were obtained from the  base of the skull through the vertex without intravenous contrast. COMPARISON:  March 24, 2020 FINDINGS: Brain: There is mild to moderate severity cerebral atrophy with widening of the extra-axial spaces and ventricular dilatation. There are areas of decreased attenuation within the white matter tracts of the supratentorial brain, consistent with microvascular disease changes. 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 Vascular: No hyperdense vessel or unexpected calcification. Skull: Normal. Negative for fracture or focal lesion. Sinuses/Orbits: No acute finding. Other: None. IMPRESSION: 1. Generalized cerebral atrophy. 2. No acute intracranial abnormality. Electronically Signed   By: Virgina Norfolk M.D.   On: 04/06/2020 15:58   CT Head Wo Contrast  Result Date: 03/24/2020 CLINICAL DATA:  Weakness, altered mental status EXAM: CT HEAD WITHOUT CONTRAST TECHNIQUE: Contiguous axial images were obtained from the base of the skull through the vertex without intravenous contrast. COMPARISON:  12/06/2019 FINDINGS: Brain: No evidence of acute infarction, hemorrhage, hydrocephalus, extra-axial collection or mass lesion/mass effect. 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 Moderate low-density changes within the periventricular and subcortical white matter compatible with chronic microvascular ischemic change. Moderate diffuse cerebral volume loss. Vascular: Atherosclerotic calcifications involving the large vessels of the skull base. No unexpected hyperdense vessel. Skull: Normal. Negative for fracture or focal lesion. Sinuses/Orbits: No acute finding. Other: None. IMPRESSION: 1. No acute intracranial findings. 2. Chronic microvascular ischemic change and cerebral volume loss. Electronically Signed   By: Davina Poke D.O.   On: 03/24/2020 13:55   CT ABDOMEN PELVIS W CONTRAST  Result Date: 03/24/2020 CLINICAL DATA:  Buttock wound. EXAM: CT ABDOMEN AND PELVIS WITH CONTRAST TECHNIQUE:  Multidetector CT imaging of the abdomen and pelvis was performed using the standard protocol following bolus administration of intravenous contrast. CONTRAST:  59m OMNIPAQUE IOHEXOL 300 MG/ML  SOLN COMPARISON:  Abdominal ultrasound 12/07/2019 FINDINGS: The study is mildly motion degraded.  Lower chest: Minimal atelectasis or scarring in the lung bases. No pleural effusion. Three-vessel coronary atherosclerosis. Hepatobiliary: No focal liver abnormality is seen. Unremarkable gallbladder. No biliary dilatation. Pancreas: Unremarkable. Spleen: Unremarkable. Adrenals/Urinary Tract: Unremarkable adrenal glands. No evidence of renal mass or hydronephrosis. Punctate nonobstructing calculus or vascular calcification in the right renal hilum. Unremarkable bladder. Stomach/Bowel: The stomach is unremarkable. There is a moderate amount of stool in the rectum with a small amount of scattered stool in the colon. There is no evidence of bowel obstruction. Evaluation for bowel inflammation is limited by motion, paucity of abdominal fat, and absence of oral contrast. Unremarkable appendix. Vascular/Lymphatic: Abdominal aortic atherosclerosis without aneurysm. Heavily calcified plaque at the celiac, superior mesenteric, and bilateral renal artery origins with potentially flow limiting stenoses. No enlarged lymph nodes. Reproductive: Mildly enlarged prostate. Other: No ascites or pneumoperitoneum. Right buttock soft tissue ulceration inferior to the ischium with surrounding soft tissue thickening and stranding extending into the included portion of the right thigh and perineum. No fluid collection, dissecting subcutaneous emphysema, or regional osseous destruction identified. 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 IMPRESSION: 1. Inferior right buttock ulceration with surrounding inflammation extending into the included portion of the right thigh and perineum. No abscess. No evidence of necrotizing fasciitis or  osteomyelitis. 2. Moderate rectal stool burden. 3. Aortic Atherosclerosis (ICD10-I70.0). Electronically Signed   By: Logan Bores M.D.   On: 03/24/2020 14:00   DG Chest Portable 1 View  Result Date: 04/06/2020 CLINICAL DATA:  Cough. EXAM: PORTABLE CHEST 1 VIEW COMPARISON:  March 24, 2020 FINDINGS: The lungs are mildly hyperinflated. Mild diffuse chronic appearing increased lung markings are seen. There is no evidence of acute infiltrate, pleural effusion or pneumothorax. The heart size and mediastinal contours are within normal limits. The visualized skeletal structures are unremarkable. IMPRESSION: Chronic appearing increased lung markings without evidence of acute or active cardiopulmonary disease. Electronically Signed   By: Virgina Norfolk M.D.   On: 04/06/2020 15:54   [place] EKG SITE RITE  Result Date: 04/08/2020 If Site Rite image not attached, placement could not be confirmed due to current cardiac rhythm.   SIGNED: Deatra James, MD, FACP, FHM. [facility],  Pager (please use [email or web address] to page/text)  If 7PM-7AM, please contact night-coverage [email or web address], Password [facility] 04/11/2020, 1:20 PM   Critical care management, greater than 55 minutes-was spent evaluating seeing patient reviewing records placing order drawing plan of care with consultants, calling consultants.

## 2020-04-11 NOTE — Progress Notes (Signed)
Patient awaiting EMS transport to facility.

## 2020-04-11 NOTE — Progress Notes (Signed)
SLP F/U Note  Patient Details Name: Javier Robinson MRN: [id number] DOB: 03/30/1946   Cancelled treatment:       Reason Eval/Treat Not Completed:  (chart reviewed; consulted NSG/SW). Per SW and chart notes, pt is now Comfort Care status and is awaiting discharge. Handouts on diet consistency recommended (Dysphagia level 1, puree, w/ Nectar consistency liquids - his Baseline recommended diet) were left on chart along w/ aspiration precautions. NSG to reconsult ST services if any new needs arise during his admission. Precautions posted in chart.     Jerilynn Som, MS, CCC-SLP Ihor Meinzer 04/11/2020, 1:17 PM

## 2020-04-11 NOTE — [place] FL2 (Signed)
[place] MEDICAID FL2 LEVEL OF CARE SCREENING TOOL     IDENTIFICATION  Patient Name: Javier Robinson Birthdate: 1945-10-30 Sex: male Admission Date (Current Location): 04/06/2020  [place] and [place] Number:  Chiropodist and Address:  [facility], [address] [place], [place] [address]      Provider Number: [id number]  Attending Physician Name and Address:  Kendell Bane, MD  Relative Name and Phone Number:       Current Level of Care: Hospital Recommended Level of Care: Skilled Nursing Facility (with hospice through [facility] Prior Approval Number:    Date Approved/Denied:   PASRR Number: [id number]  Discharge Plan: SNF (with hospice through [facility])    Current Diagnoses: Patient Active Problem List   Diagnosis Date Noted  . Adult failure to thrive   . Palliative care by specialist   . DNR (do not resuscitate)   . Microcytic anemia 04/06/2020  . Sepsis (HCC) 03/24/2020  . Acute metabolic encephalopathy 12/15/2019  . At high risk for aspiration 12/15/2019  . Pressure injury of skin 11/15/2019  . Protein-calorie malnutrition, severe 11/13/2019  . Malnutrition of moderate degree 08/10/2019  . Alcohol dependence (HCC) 08/09/2019  . Essential hypertension 08/09/2019  . Metabolic acidosis, increased anion gap (IAG) 08/09/2019  . Prolonged QT interval 08/09/2019  . Hypertensive urgency 07/03/2019  . Acute CHF (congestive heart failure) (HCC) 06/30/2019  . Hypoglycemia 06/30/2019  . SIRS (systemic inflammatory response syndrome) (HCC) 06/30/2019  . Hyponatremia 06/30/2019  . Hypomagnesemia 06/30/2019    Orientation RESPIRATION BLADDER Height & Weight     Self  Normal Incontinent, Indwelling catheter Weight: 134 lb 0.6 oz (60.8 kg) Height:  5\' 7"  (170.2 cm)  BEHAVIORAL SYMPTOMS/MOOD NEUROLOGICAL BOWEL NUTRITION STATUS   (None)  (None) Incontinent Diet (DYS 1. Fluid nectar thick. Please add gravies to meats,  potatoes. Yogurts, puddings. Pt may have Oatmeal w/ butter/sugar per Speech.)  AMBULATORY STATUS COMMUNICATION OF NEEDS Skin       Skin abrasions, Other (Comment), PU Stage and Appropriate Care (Blister. Unstageable pressure injury on right, left, mid buttocks (foam prn). Non-pressure wound on right posterior elbow (foam).)   PU Stage 2 Dressing:  (Medial sacrum: Foam. Right buttocks: Foam every 3 days. Right hip: Foam.)                   Personal Care Assistance Level of Assistance              Functional Limitations Info  Sight, Hearing, Speech     Speech Info: Impaired (Incomprehensible)    SPECIAL CARE FACTORS FREQUENCY                       Contractures Contractures Info: Not present    Additional Factors Info  Code Status, Allergies Code Status Info: DNR Allergies Info: NKDA           Current Medications (04/11/2020):  This is the current hospital active medication list Current Facility-Administered Medications  Medication Dose Route Frequency Provider Last Rate Last Admin  . Chlorhexidine Gluconate Cloth 2 % PADS 6 each  6 each Topical Daily 04/13/2020, MD   6 each at 04/09/20 2103  . LORazepam (ATIVAN) tablet 1 mg  1 mg Oral Q4H PRN 2104, NP      . MEDLINE mouth rinse  15 mL Mouth Rinse BID Shahmehdi, Seyed A, MD   15 mL at 04/10/20 2044  . morphine CONCENTRATE 10  MG/0.5ML oral solution 5 mg  5 mg Oral Q2H PRN Shahmehdi, Seyed A, MD      . tamsulosin (FLOMAX) capsule 0.4 mg  0.4 mg Oral QPC supper Nevin Bloodgood A, MD   0.4 mg at 04/09/20 1735     Discharge Medications: Please see discharge summary for a list of discharge medications.  Relevant Imaging Results:  Relevant Lab Results:   Additional Information SS#: [id number]  Margarito Liner, LCSW

## 2020-04-11 NOTE — Discharge Summary (Addendum)
Physician Discharge Summary [facility]    Patient: Javier Robinson                   Admit date: 04/06/2020   DOB: 28-Sep-1945             Discharge date:04/11/2020/2:55 PM PCH:[id number]                          PCP: Maryella Shivers, MD  Disposition: SNF with [facility]   Recommendations for Outpatient Follow-up:   . Follow up: Hospice  Discharge Condition: Stable   Code Status:   Code Status: DNR  Diet recommendation: As tolerated    Discharge Diagnoses:    Active Problems:   Metabolic acidosis, increased anion gap (IAG)   Hypoglycemia   Hyponatremia   Alcohol dependence (HCC)   Essential hypertension   Protein-calorie malnutrition, severe   Microcytic anemia   Adult failure to thrive   Palliative care by specialist   DNR (do not resuscitate)   History of Present Illness/ Hospital Course [name] Summary:   The patient was seen and examined for, comfortably sleeping. Seem to be unresponsive but respond to pain stimuli  Met with the family yesterday concerning poor prognosis comfort care measures Family met with the palliative care team >>>> family is agreed to comfort care measures, evaluate for hospice today   Brief Narrative:   HPI: Javier [place] Gray16 yo M with PMHx  chronic alcohol use, HTN, recent admission for sepsis and malnutrition who presents from ALF apparently for difficulty swallowing,  generalized weakness, confusion, difficulty . Reviewed records, pt was just hospitalized for sepsis, d/c to SNF. No apparent focal deficits on exam. CT Head shows NAICA. CXR w/o PNA or apparent major aspiration.  Apparently mental status is at baseline per report. Per report from facility [facility])  Labs as above. BMP with profound hyponatremia ( Na 110) , acidemia, hypoglycemia. Suspect related to poor PO intake. NS at 100.  Status post ICU transfer 04/10/2020 Family met with palliative care team We have met with family yesterday, along with  palliative care team, nursing staff, nephrology All in agreement for patient to proceed with comfort care measures  Patient will be reassessed by palliative care team today for possible hospice-hospice home   ----------------------------------------------------------------------------------------------------------------- Hyponatremia and Hypochloremia with Non Anion Gap Metabolic Acidosis  -Sees all lab work, DC all IV fluid resuscitation  -NG tube was placed hoping sodium level improved with tube feeds, unfortunately he pulled it out this a.m. -Nephrology following restart the patient on 3% saline -Multifactorial including poor p.o. intake, dehydration --- SIADH -Appreciating nephrology assist, -Sodium on admission 108, 109, 112, 114, 112, 115 >> 119 >>> 122, 123... 125,125  now -Discontinued hypertonic suppression and supplement IV fluids   On admission Na  110. Cl 79. Bicarb 17. Patient appeared hypovolemic on admission. Does appear that he sometimes has chronic mild hyponatremia in the low 130s, likely compounded by alcohol use. Not on thiazide diuretic or other offending agents. CT head was negative. Appears to be at baseline for mental status.    Metabolic encephalopathy  -Baseline cognition, insight cannot be determined, recent history of stroke Family member stating patient was verbal prior to admission -Patient has had a recent stroke -Verbal communication interaction and insight still compromised due to poor hydration, stroke, hyponatremia -Avoided cautiously to prevent overcorrection or rapid correction of sodium   Poor p.o. intake/dehydration/hypoglycemia  Poor prognosis >>> comfort care measures  -  Unable to completed dysphagia evaluation as patient remains encephalopathic -NG tube was placed x2 by ICU staff-patient become agitated pulled out this morning -Discontinued IV fluid resuscitation -Episodic hypoglycemia, will be addressed with D10/D50  as  needed Blood sugar is improved, D10 NS was initiated on admission glucose 49. -Likely related to poor PO intake.  -Discontinue CBG checks  Severe malnutrition-poor p.o. intake/dysphagia (in the setting of previous stroke) -Speech will be consulted for evaluation -But at this time patient remains encephalopathic -With attempt of NG tube placement patient become agitated --nursing staff was not able to place the NG tube -We will wait till encephalopathy improves before reevaluation -Family has been made aware of poor p.o. intake, prognosis with comorbidities remains very poor  HTN /hypotension -Discontinuing meds  Microcytic Anemia  HgB is 9.7, >>> 8.8  >> 8.7 >>  -improved from recent hospitalization with value of 7.5. MCV low.  -We will not monitor anymore  Alcohol Dependence  -No signs of withdrawal, no seizure episodes, vitals remained stable Unclear if he still using alcohol-  has been at ALF.  -monitor closely, start CIWA as needed  -Discontinuing IV vitamins   Hypokalemia --improved   Ethics -DNR/DNI confirmed - due to multiple comorbidities, continued metabolic encephalopathy poor p.o. intake, severe hyponatremia malnutrition, recent stroke --- prognosis remain grim Family was updated was informed of the findings-agreed to comfort care measures, Will further evaluate today 04/11/2020 if patient is candidate for hospice home Palliative care consulted -appreciate assistance Reaching out to family once again Javier Robinson  [phone]   Nutritional status:  Nutrition Problem: Severe Malnutrition Etiology: social / environmental circumstances (suspected inadequate oral intake, dysphagia, hx EtOH use) Signs/Symptoms: severe fat depletion, severe muscle depletion Interventions: Refer to RD note for recommendations  Consultants: Nephrology PCCM Palliative care  consulted   ---------------------------------------------------------------------------------------------------------------------------------- Code Status:   Code Status: DNR Family Communication: Javier Robinson  [phone] No family member present at bedside- Called Niece at [phone] -- updated  Discussed in detail including goal of care and poor prognosis--  She expressed understanding, agreeable to palliative care consultation.    Admission status:   Status is: Inpatient  Remains inpatient appropriate because:Inpatient level of care appropriate due to severity of illness   Dispo: The patient is from: SNF  Anticipated d/c is to: SNF   Nutritional status:  Nutrition Problem: Severe Malnutrition Etiology: social / environmental circumstances (suspected inadequate oral intake, dysphagia, hx EtOH use) Signs/Symptoms: severe fat depletion, severe muscle depletion Interventions: Refer to RD note for recommendations   Discharge Instructions:   Discharge Instructions    Activity as tolerated - No restrictions   Complete by: As directed    Diet - low sodium heart healthy   Complete by: As directed    Discharge instructions   Complete by: As directed    Follow-up hospice ASAP   Increase activity slowly   Complete by: As directed    No wound care   Complete by: As directed        Medication List    STOP taking these medications   amLODipine 5 MG tablet Commonly known as: NORVASC   ferrous sulfate 325 (65 FE) MG tablet   folic acid 1 MG tablet Commonly known as: FOLVITE   multivitamin with minerals Tabs tablet   sodium chloride 1 g tablet   thiamine 100 MG tablet     TAKE these medications   Chlorhexidine Gluconate Cloth 2 % Pads Apply 6 each topically daily for 10 days.  LORazepam 1 MG tablet Commonly known as: ATIVAN Take 1 tablet (1 mg total) by mouth every 4 (four) hours as needed for anxiety or sleep.   morphine CONCENTRATE 10  MG/0.5ML Soln concentrated solution Take 0.25 mLs (5 mg total) by mouth every 2 (two) hours as needed for moderate pain or shortness of breath.   Nepro Liqd Take 1 Bottle by mouth 3 (three) times daily with meals.   nicotine 14 mg/24hr patch Commonly known as: NICODERM CQ - dosed in mg/24 hours Place 1 patch (14 mg total) onto the skin daily.   tamsulosin 0.4 MG Caps capsule Commonly known as: FLOMAX Take 1 capsule (0.4 mg total) by mouth daily after supper.       Contact information for after-discharge care    [facility] .   Service: Skilled Nursing Contact information: [place] [phone]                 No Known Allergies   Procedures /Studies:   DG Chest 2 View  Result Date: 03/24/2020 CLINICAL DATA:  Altered mental status. EXAM: CHEST - 2 VIEW COMPARISON:  12/15/2019. FINDINGS: Mediastinum hilar structures normal. Stable cardiomegaly. No pulmonary venous congestion. No focal infiltrate. No pleural effusion or pneumothorax. Degenerative changes scoliosis thoracic spine. Degenerative changes both shoulders. Stable sclerotic changes right proximal humerus most likely secondary to infarct or enchondroma. IMPRESSION: Stable cardiomegaly. No acute cardiopulmonary disease. Previously identified pulmonary venous congestion has cleared. Electronically Signed   By: Marcello Moores  Register   On: 03/24/2020 10:39   DG Abd 1 View  Result Date: 04/09/2020 CLINICAL DATA:  In G-tube placement EXAM: ABDOMEN - 1 VIEW COMPARISON:  None. FINDINGS: Enteric tube terminates in the proximal stomach overlying the gastric fundus. No evidence of pneumatosis or pneumoperitoneum. IMPRESSION: Enteric tube terminates in the proximal stomach overlying the gastric fundus. Electronically Signed   By: Ilona Sorrel M.D.   On: 04/09/2020 16:10   CT Head Wo Contrast  Result Date: 04/06/2020 CLINICAL DATA:  Dysphagia. EXAM: CT  HEAD WITHOUT CONTRAST TECHNIQUE: Contiguous axial images were obtained from the base of the skull through the vertex without intravenous contrast. COMPARISON:  March 24, 2020 FINDINGS: Brain: There is mild to moderate severity cerebral atrophy with widening of the extra-axial spaces and ventricular dilatation. There are areas of decreased attenuation within the white matter tracts of the supratentorial brain, consistent with microvascular disease changes. 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 Vascular: No hyperdense vessel or unexpected calcification. Skull: Normal. Negative for fracture or focal lesion. Sinuses/Orbits: No acute finding. Other: None. IMPRESSION: 1. Generalized cerebral atrophy. 2. No acute intracranial abnormality. Electronically Signed   By: Virgina Norfolk M.D.   On: 04/06/2020 15:58   CT Head Wo Contrast  Result Date: 03/24/2020 CLINICAL DATA:  Weakness, altered mental status EXAM: CT HEAD WITHOUT CONTRAST TECHNIQUE: Contiguous axial images were obtained from the base of the skull through the vertex without intravenous contrast. COMPARISON:  12/06/2019 FINDINGS: Brain: No evidence of acute infarction, hemorrhage, hydrocephalus, extra-axial collection or mass lesion/mass effect. 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 Moderate low-density changes within the periventricular and subcortical white matter compatible with chronic microvascular ischemic change. Moderate diffuse cerebral volume loss. Vascular: Atherosclerotic calcifications involving the large vessels of the skull base. No unexpected hyperdense vessel. Skull: Normal. Negative for fracture or focal lesion. Sinuses/Orbits: No acute finding. Other: None. IMPRESSION: 1. No acute intracranial  findings. 2. Chronic microvascular ischemic change and cerebral volume loss. Electronically Signed   By: Davina Poke D.O.   On: 03/24/2020 13:55   CT ABDOMEN PELVIS W CONTRAST  Result Date:  03/24/2020 CLINICAL DATA:  Buttock wound. EXAM: CT ABDOMEN AND PELVIS WITH CONTRAST TECHNIQUE: Multidetector CT imaging of the abdomen and pelvis was performed using the standard protocol following bolus administration of intravenous contrast. CONTRAST:  81m OMNIPAQUE IOHEXOL 300 MG/ML  SOLN COMPARISON:  Abdominal ultrasound 12/07/2019 FINDINGS: The study is mildly motion degraded. Lower chest: Minimal atelectasis or scarring in the lung bases. No pleural effusion. Three-vessel coronary atherosclerosis. Hepatobiliary: No focal liver abnormality is seen. Unremarkable gallbladder. No biliary dilatation. Pancreas: Unremarkable. Spleen: Unremarkable. Adrenals/Urinary Tract: Unremarkable adrenal glands. No evidence of renal mass or hydronephrosis. Punctate nonobstructing calculus or vascular calcification in the right renal hilum. Unremarkable bladder. Stomach/Bowel: The stomach is unremarkable. There is a moderate amount of stool in the rectum with a small amount of scattered stool in the colon. There is no evidence of bowel obstruction. Evaluation for bowel inflammation is limited by motion, paucity of abdominal fat, and absence of oral contrast. Unremarkable appendix. Vascular/Lymphatic: Abdominal aortic atherosclerosis without aneurysm. Heavily calcified plaque at the celiac, superior mesenteric, and bilateral renal artery origins with potentially flow limiting stenoses. No enlarged lymph nodes. Reproductive: Mildly enlarged prostate. Other: No ascites or pneumoperitoneum. Right buttock soft tissue ulceration inferior to the ischium with surrounding soft tissue thickening and stranding extending into the included portion of the right thigh and perineum. No fluid collection, dissecting subcutaneous emphysema, or regional osseous destruction identified. 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 IMPRESSION: 1. Inferior right buttock ulceration with surrounding inflammation extending into the  included portion of the right thigh and perineum. No abscess. No evidence of necrotizing fasciitis or osteomyelitis. 2. Moderate rectal stool burden. 3. Aortic Atherosclerosis (ICD10-I70.0). Electronically Signed   By: ALogan BoresM.D.   On: 03/24/2020 14:00   DG Chest Portable 1 View  Result Date: 04/06/2020 CLINICAL DATA:  Cough. EXAM: PORTABLE CHEST 1 VIEW COMPARISON:  March 24, 2020 FINDINGS: The lungs are mildly hyperinflated. Mild diffuse chronic appearing increased lung markings are seen. There is no evidence of acute infiltrate, pleural effusion or pneumothorax. The heart size and mediastinal contours are within normal limits. The visualized skeletal structures are unremarkable. IMPRESSION: Chronic appearing increased lung markings without evidence of acute or active cardiopulmonary disease. Electronically Signed   By: TVirgina NorfolkM.D.   On: 04/06/2020 15:54   [place]KG SITE RITE  Result Date: 04/08/2020 If Site Rite image not attached, placement could not be confirmed due to current cardiac rhythm.   Subjective:   Patient was seen and examined 04/11/2020, 2:55 PM Patient stable today. No acute distress.  No issues overnight Stable for discharge.  Discharge Exam:    Vitals:   04/10/20 1400 04/10/20 1500 04/10/20 1600 04/10/20 2008  BP: 130/68 133/66  (!) 144/49  Pulse: 87 93 81 80  Resp: '20 14 18 20  ' Temp: 98 F (36.7 C)   98.3 F (36.8 C)  TempSrc: Axillary   Oral  SpO2: 100% 97% 98% 94%  Weight:      Height:        General: Confused, altered. Cardiovascular: S1 & S2 heard, RRR, S1/S2 +. No murmurs, rubs, gallops or clicks. No JVD or pedal edema. Respiratory: Clear to auscultation without wheezing, rhonchi or crackles. No increased work of breathing. Abdominal:  Non-distended, non-tender & soft. No organomegaly  or masses appreciated. Normal bowel sounds heard. CNS: Alert and oriented. No focal deficits. Extremities: no edema, no cyanosis    The results of  significant diagnostics from this hospitalization (including imaging, microbiology, ancillary and laboratory) are listed below for reference.      Microbiology:   Recent Results (from the past 240 hour(s))  SARS Coronavirus 2 by RT PCR (hospital order, performed in [facility] hospital lab) Nasopharyngeal Nasopharyngeal Swab     Status: None   Collection Time: 04/06/20  4:00 PM   Specimen: Nasopharyngeal Swab  Result Value Ref Range Status   SARS Coronavirus 2 NEGATIVE NEGATIVE Final    Comment: (NOTE) SARS-CoV-2 target nucleic acids are NOT DETECTED.  The SARS-CoV-2 RNA is generally detectable in upper and lower respiratory specimens during the acute phase of infection. The lowest concentration of SARS-CoV-2 viral copies this assay can detect is 250 copies / mL. A negative result does not preclude SARS-CoV-2 infection and should not be used as the sole basis for treatment or other patient management decisions.  A negative result may occur with improper specimen collection / handling, submission of specimen other than nasopharyngeal swab, presence of viral mutation(s) within the areas targeted by this assay, and inadequate number of viral copies (<250 copies / mL). A negative result must be combined with clinical observations, patient history, and epidemiological information.  Fact Sheet for Patients:   StrictlyIdeas.no  Fact Sheet for Healthcare Providers: BankingDealers.co.za  This test is not yet approved or  cleared by the [place] FDA and has been authorized for detection and/or diagnosis of SARS-CoV-2 by FDA under an Emergency Use Authorization (EUA).  This EUA will remain in effect (meaning this test can be used) for the duration of the COVID-19 declaration under Section 564(b)(1) of the Act, 21 U.S.C. section 360bbb-3(b)(1), unless the authorization is terminated or revoked sooner.  Performed at [facility],  [address] [place], [place] [address]   MRSA PCR Screening     Status: None   Collection Time: 04/08/20  6:30 PM   Specimen: Nasopharyngeal  Result Value Ref Range Status   MRSA by PCR NEGATIVE NEGATIVE Final    Comment:        The GeneXpert MRSA Assay (FDA approved for NASAL specimens only), is one component of a comprehensive MRSA colonization surveillance program. It is not intended to diagnose MRSA infection nor to guide or monitor treatment for MRSA infections. Performed at [facility], [address] [place], [place] [address]      Labs:   CBC: Recent Labs  Lab 04/06/20 1444 04/07/20 0537 04/08/20 0534 04/09/20 0414 04/10/20 0156  WBC 8.1 11.4* 7.8 6.5 5.7  NEUTROABS 6.5  --   --   --   --   HGB 9.7* 8.8* 8.6* 8.7* 8.3*  HCT 28.7* 24.3* 23.9* 24.4* 23.2*  MCV 78.6* 75.7* 76.1* 77.0* 76.8*  PLT 358 359 355 283 048   Basic Metabolic Panel: Recent Labs  Lab 04/09/20 1001 04/09/20 1001 04/09/20 1238 04/09/20 1238 04/09/20 1800 04/09/20 2045 04/10/20 0156 04/10/20 0952 04/10/20 1422  NA 123*   < > 123*  123*   < > 121* 123* 121* 125* 125*  K 3.5  --  3.6  --  3.7 3.7 3.7  --   --   CL 95*  --  94*  --  96* 95* 93*  --   --   CO2 19*  --  18*  --  19* 20* 19*  --   --  GLUCOSE 75  --  83  --  87 96 99  --   --   BUN <5*  --  <5*  --  <5* <5* <5*  --   --   CREATININE <0.30*  --  0.42*  --  <0.30* <0.30* <0.30*  --   --   CALCIUM 7.8*  --  8.1*  --  7.9* 7.8* 8.0*  --   --   MG  --   --   --   --  1.5*  --  1.9  --   --   PHOS  --   --   --   --  2.0*  --  3.1  --   --    < > = values in this interval not displayed.   Liver Function Tests: Recent Labs  Lab 04/06/20 1444  AST 27  ALT 13  ALKPHOS 75  BILITOT 0.7  PROT 7.9  ALBUMIN 3.0*   BNP (last 3 results) Recent Labs    08/12/19 1239 11/11/19 1251  BNP 105.0* 59.0   Cardiac Enzymes: No results for input(s): CKTOTAL, CKMB, CKMBINDEX, TROPONINI in the last 168  hours. CBG: Recent Labs  Lab 04/09/20 1930 04/09/20 2354 04/10/20 0337 04/10/20 0719 04/10/20 1143  GLUCAP 82 92 88 96 75   Hgb A1c No results for input(s): HGBA1C in the last 72 hours. Lipid Profile No results for input(s): CHOL, HDL, LDLCALC, TRIG, CHOLHDL, LDLDIRECT in the last 72 hours. Thyroid function studies No results for input(s): TSH, T4TOTAL, T3FREE, THYROIDAB in the last 72 hours.  Invalid input(s): FREET3 Anemia work up No results for input(s): VITAMINB12, FOLATE, FERRITIN, TIBC, IRON, RETICCTPCT in the last 72 hours. Urinalysis    Component Value Date/Time   COLORURINE YELLOW (A) 04/06/2020 1600   APPEARANCEUR CLEAR (A) 04/06/2020 1600   LABSPEC 1.019 04/06/2020 1600   PHURINE 7.0 04/06/2020 1600   GLUCOSEU NEGATIVE 04/06/2020 1600   HGBUR NEGATIVE 04/06/2020 1600   BILIRUBINUR NEGATIVE 04/06/2020 1600   KETONESUR NEGATIVE 04/06/2020 1600   PROTEINUR NEGATIVE 04/06/2020 1600   UROBILINOGEN 0.2 02/18/2014 1720   NITRITE NEGATIVE 04/06/2020 1600   LEUKOCYTESUR NEGATIVE 04/06/2020 1600   Pressure Injury 11/14/19 Hip Right Stage 2 -  Partial thickness loss of dermis presenting as a shallow open injury with a red, pink wound bed without slough. open wound (Active)  11/14/19 1110  Location: Hip  Location Orientation: Right  Staging: Stage 2 -  Partial thickness loss of dermis presenting as a shallow open injury with a red, pink wound bed without slough.  Wound Description (Comments): open wound  Present on Admission:      Pressure Injury 11/14/19 Thigh Posterior;Proximal Stage 1 -  Intact skin with non-blanchable redness of a localized area usually over a bony prominence. (Active)  11/14/19 1114  Location: Thigh  Location Orientation: Posterior;Proximal  Staging: Stage 1 -  Intact skin with non-blanchable redness of a localized area usually over a bony prominence.  Wound Description (Comments):   Present on Admission:      Pressure Injury 03/24/20 Buttocks  Right;Left;Mid Unstageable - Full thickness tissue loss in which the base of the injury is covered by slough (yellow, tan, Lampe, green or brown) and/or eschar (tan, brown or black) in the wound bed. Pt has two pressure i (Active)  03/24/20 1200  Location: Buttocks  Location Orientation: Right;Left;Mid  Staging: Unstageable - Full thickness tissue loss in which the base of the injury is covered by slough (yellow,  tan, [place]iter, green or brown) and/or eschar (tan, brown or black) in the wound bed.  Wound Description (Comments): Pt has two pressure injuries on sacrum. Right sided pressure injury is unstageable   Present on Admission: Yes     Pressure Injury 03/24/20 Sacrum Medial Stage 2 -  Partial thickness loss of dermis presenting as a shallow open injury with a red, pink wound bed without slough. (Active)  03/24/20 2230  Location: Sacrum  Location Orientation: Medial  Staging: Stage 2 -  Partial thickness loss of dermis presenting as a shallow open injury with a red, pink wound bed without slough.  Wound Description (Comments):   Present on Admission: Yes     Pressure Injury 03/24/20 Buttocks Right Stage 2 -  Partial thickness loss of dermis presenting as a shallow open injury with a red, pink wound bed without slough. (Active)  03/24/20 2230  Location: Buttocks  Location Orientation: Right  Staging: Stage 2 -  Partial thickness loss of dermis presenting as a shallow open injury with a red, pink wound bed without slough.  Wound Description (Comments):   Present on Admission: Yes     Pressure Injury 03/24/20 Hip Right Stage 2 -  Partial thickness loss of dermis presenting as a shallow open injury with a red, pink wound bed without slough. (Active)  03/24/20 2230  Location: Hip  Location Orientation: Right  Staging: Stage 2 -  Partial thickness loss of dermis presenting as a shallow open injury with a red, pink wound bed without slough.  Wound Description (Comments):   Present on Admission:  Yes       Time coordinating discharge: Over 45 minutes  SIGNED: Deatra James, MD, FACP, [facility]. [facility],  Please use [email or web address] to Page If 7PM-7AM, please contact night-coverage [email or web address] [facility] 04/11/2020, 2:55 PM

## 2020-04-11 NOTE — TOC Initial Note (Signed)
Transition of Care [facility]) - Initial/Assessment Note    Patient Details  Name: Javier Robinson MRN: [id number] Date of Birth: 1946/08/15  Transition of Care [facility]) CM/SW Contact:    Margarito Liner, LCSW Phone Number: 04/11/2020, 2:08 PM  Clinical Narrative: Per Palliative NP, plan for return to SNF with hospice. Patient is still eating. Patient only oriented to self. CSW called patient's niece, introduced role, and explained that discharge planning would be discussed. [facility] admissions coordinator confirmed they can accommodate this and said they use [facility]. Niece is agreeable. Patient's niece agreeable to return to [facility] with hospice with [facility]. Referral made to Dayna Barker, RN. [facility] is able to accept patient back today as long as a rapid COVID test is done. No further concerns. CSW encouraged patient's niece to contact CSW as needed. CSW will continue to follow patient and his niece for support and facilitate return to SNF once discharged.                 Expected Discharge Plan: Skilled Nursing Facility (with hospice) Barriers to Discharge: Other (comment) (Rapid COVID test)   Patient Goals and CMS Choice Patient states their goals for this hospitalization and ongoing recovery are:: Patient not fully oriented.   Choice offered to / list presented to : NA  Expected Discharge Plan and Services Expected Discharge Plan: Skilled Nursing Facility (with hospice)     Post Acute Care Choice: Hospice Living arrangements for the past 2 months: Single Family Home, Skilled Nursing Facility                                      Prior Living Arrangements/Services Living arrangements for the past 2 months: Single Family Home, Skilled Nursing Facility Lives with:: Facility Resident Patient language and need for interpreter reviewed:: Yes        Need for Family Participation in Patient Care: Yes (Comment) Care giver support system  in place?: Yes (comment)   Criminal Activity/Legal Involvement Pertinent to Current Situation/Hospitalization: No - Comment as needed  Activities of Daily Living Home Assistive Devices/Equipment: None ADL Screening (condition at time of admission) Patient's cognitive ability adequate to safely complete daily activities?: No Is the patient deaf or have difficulty hearing?: No Does the patient have difficulty seeing, even when wearing glasses/contacts?: No Does the patient have difficulty concentrating, remembering, or making decisions?: No Patient able to express need for assistance with ADLs?: Yes Does the patient have difficulty dressing or bathing?: Yes Independently performs ADLs?: No Does the patient have difficulty walking or climbing stairs?: Yes Weakness of Legs: Both Weakness of Arms/Hands: Both  Permission Sought/Granted      Share Information with NAME: Rodman Comp  Permission granted to share info w AGENCY: [facility], [facility]  Permission granted to share info w Relationship: Niece  Permission granted to share info w Contact Information: [phone]  Emotional Assessment Appearance:: Appears stated age Attitude/Demeanor/Rapport: Unable to Assess Affect (typically observed): Unable to Assess Orientation: : Oriented to Self Alcohol / Substance Use: Alcohol Use Psych Involvement: No (comment)  Admission diagnosis:  Acidemia [E87.2] Hyponatremia [E87.1] Patient Active Problem List   Diagnosis Date Noted  . Adult failure to thrive   . Palliative care by specialist   . DNR (do not resuscitate)   . Microcytic anemia 04/06/2020  . Sepsis (HCC) 03/24/2020  . Acute metabolic encephalopathy 12/15/2019  .  At high risk for aspiration 12/15/2019  . Pressure injury of skin 11/15/2019  . Protein-calorie malnutrition, severe 11/13/2019  . Malnutrition of moderate degree 08/10/2019  . Alcohol dependence (HCC) 08/09/2019  . Essential hypertension 08/09/2019   . Metabolic acidosis, increased anion gap (IAG) 08/09/2019  . Prolonged QT interval 08/09/2019  . Hypertensive urgency 07/03/2019  . Acute CHF (congestive heart failure) (HCC) 06/30/2019  . Hypoglycemia 06/30/2019  . SIRS (systemic inflammatory response syndrome) (HCC) 06/30/2019  . Hyponatremia 06/30/2019  . Hypomagnesemia 06/30/2019   PCP:  Charlott Rakes, MD Pharmacy:   [facility] [address] [place], [place] - [address] [place] [address] Phone: [phone] Fax: [phone]     Social Determinants of Health (SDOH) Interventions    Readmission Risk Interventions Readmission Risk Prevention Plan 12/15/2019  Transportation Screening Complete  PCP or Specialist Appt within 3-5 Days Complete  HRI or Home Care Consult Complete  Social Work Consult for Recovery Care Planning/Counseling Complete  Palliative Care Screening Not Applicable  Medication Review Oceanographer) Complete  Some recent data might be hidden

## 2020-04-11 NOTE — Progress Notes (Signed)
[facility] Room [address] [facility]) Hospital Liaison RN note:  Received request from Lorinda Creed, NP for hospice services at [facility] after discharge.Chart and patient information has been reviewed by [facility] physician and patient is approved for Hospice services.  Spoke with niece, Chauncey Cruel, over the phone to initiate education related to hospice philosophy, services and to answer any questions. She verbalized understanding of information given. Plan is for discharge today by EMS.  Please send signed and completed DNR with patient on discharge and provide prescriptions as needed to ensure ongoing symptom management.  Contact number provided to niece. Above information shared with hospital team.  Please call with any hospice related questions or concerns.  Thank you for the opportunity to participate in this patient's care.  Cyndra Numbers, RN [facility] Liaison [phone]

## 2020-04-11 NOTE — Progress Notes (Signed)
Patient ID: Javier Robinson, male   DOB: 11-30-1945, 74 y.o.   MRN: [id number]  This NP reviewed medical records and spoke to patient's bedside RN.  Patient continues to eat 50% of his meals and is interacting with staff.  Detailed conversation had yesterday with decision maker/Pamela Totten, focus of care is comfort, quality and dignity.  MOST form completed to reflect full comfort  Family is agreement with patient to return to skilled facility with hospice benefit.  Discussed with Sarah/LCSW.  No charge   Lorinda Creed NP  Palliative Medicine Team Team Phone # [phone] Pager [phone]

## 2020-04-11 NOTE — TOC Transition Note (Signed)
Transition of Care [facility]) - CM/SW Discharge Note   Patient Details  Name: Javier Robinson MRN: [id number] Date of Birth: 1946/07/04  Transition of Care [facility]) CM/SW Contact:  Margarito Liner, LCSW Phone Number: 04/11/2020, 3:44 PM   Clinical Narrative: Patent has orders to discharge to [facility] with hospice today once rapid COVID test results are in. RN will call report to [phone] (Room 30A) prior to setting up EMS transport. Nurse will call niece once transport is being arranged. No further concerns. CSW signing off.  Final next level of care: Skilled Nursing Facility Barriers to Discharge: No Barriers Identified   Patient Goals and CMS Choice Patient states their goals for this hospitalization and ongoing recovery are:: Patient not fully oriented.   Choice offered to / list presented to :  (Niece)  Discharge Placement   Existing PASRR number confirmed : 04/11/20          Patient chooses bed at: [facility] Patient to be transferred to facility by: EMS Name of family member notified: Chauncey Cruel Patient and family notified of of transfer: 04/11/20  Discharge Plan and Services     Post Acute Care Choice: Hospice                               Social Determinants of Health (SDOH) Interventions     Readmission Risk Interventions Readmission Risk Prevention Plan 12/15/2019  Transportation Screening Complete  PCP or Specialist Appt within 3-5 Days Complete  HRI or Home Care Consult Complete  Social Work Consult for Recovery Care Planning/Counseling Complete  Palliative Care Screening Not Applicable  Medication Review Oceanographer) Complete  Some recent data might be hidden

## 2020-05-19 DEATH — deceased

## 2020-11-04 IMAGING — DX DG CHEST 1V PORT
1 series · 1 of 1 positions shown · non-contrast
Comparison: 11/11/2019

CLINICAL DATA: Weakness and confusion. Hypoglycemia.

EXAM:
PORTABLE CHEST 1 VIEW

[chest ap]
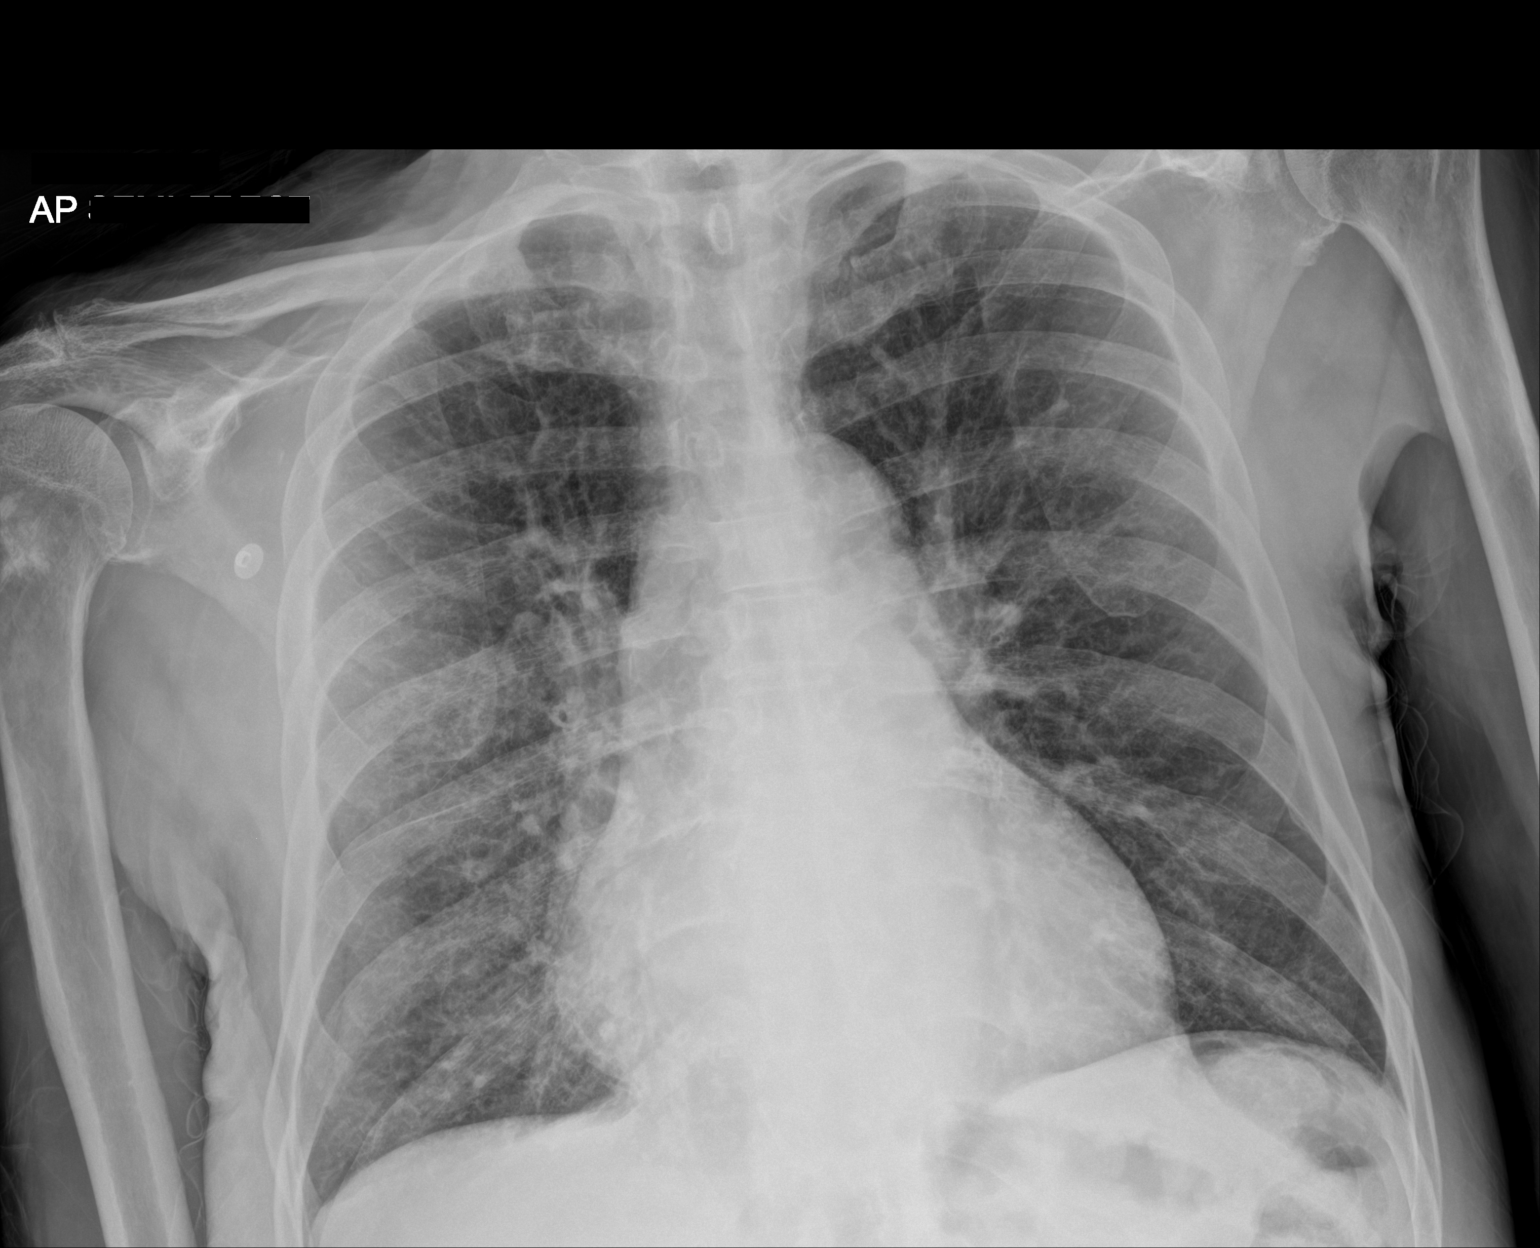

[1 of 1 positions shown; findings below may reference images not displayed]

FINDINGS: Mild cardiomegaly. Tortuous aorta. The lungs are clear. The
vascularity is normal. No effusions. No acute bone finding.
IMPRESSION: No active disease. Mild cardiomegaly.

## 2021-02-21 IMAGING — CT CT ABD-PELV W/ CM
2 of 5 series · 16 of 46 positions shown, 18 images · IV contrast (APPLIED)
Comparison: Abdominal ultrasound 12/07/2019

CLINICAL DATA: Buttock wound.

EXAM:
CT ABDOMEN AND PELVIS WITH CONTRAST
TECHNIQUE: Multidetector CT imaging of the abdomen and pelvis was performed
using the standard protocol following bolus administration of
intravenous contrast.
CONTRAST:  75mL OMNIPAQUE IOHEXOL 300 MG/ML  SOLN

[Series 2: routine abd/pel with · axial · 0.73mm/px · z∈[-890,-475]mm · 13 of 93 slices shown, 15 images]
[im 5/93  soft-tissue]
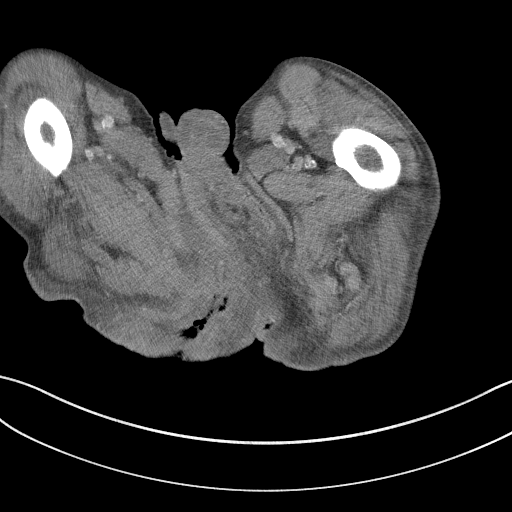
[im 5/93  bone]
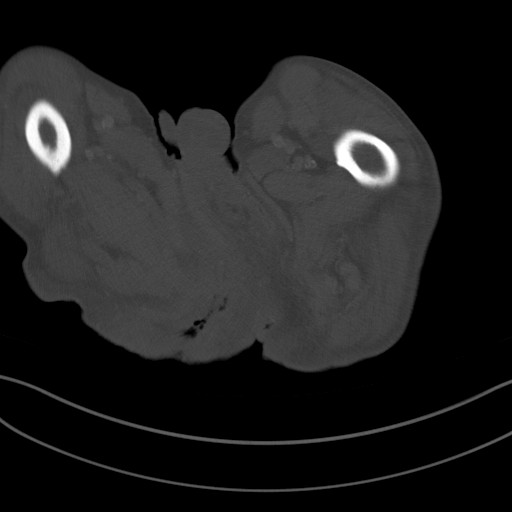
[im 15/93  soft-tissue]
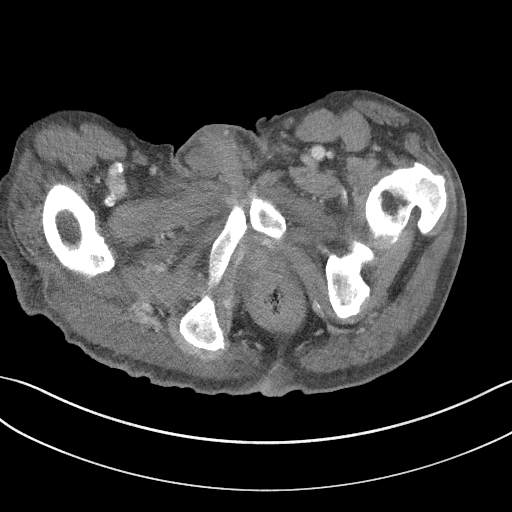
[im 20/93  soft-tissue]
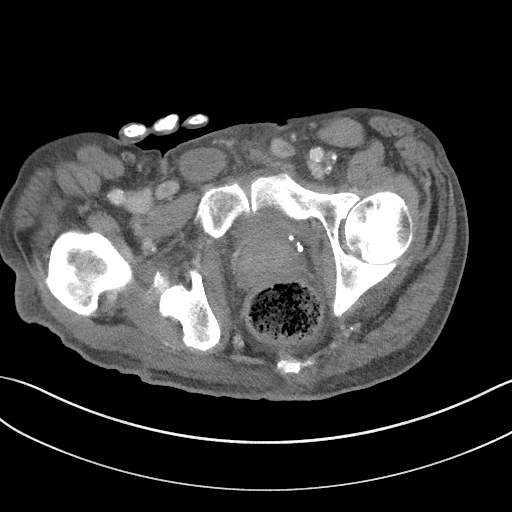
[im 25/93  soft-tissue]
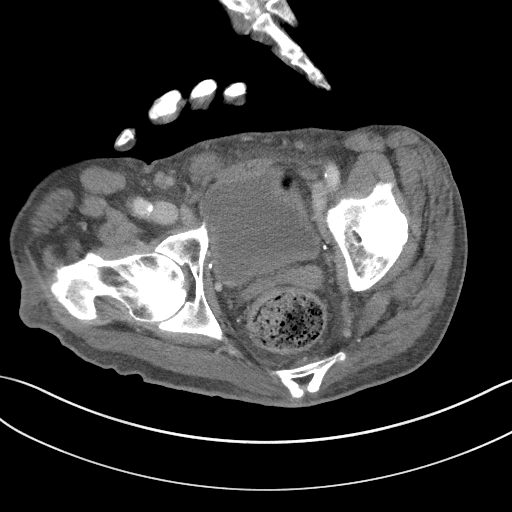
[im 34/93  soft-tissue]
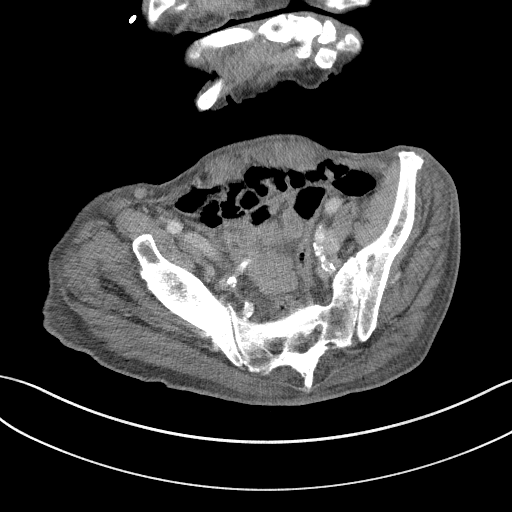
[im 39/93  soft-tissue]
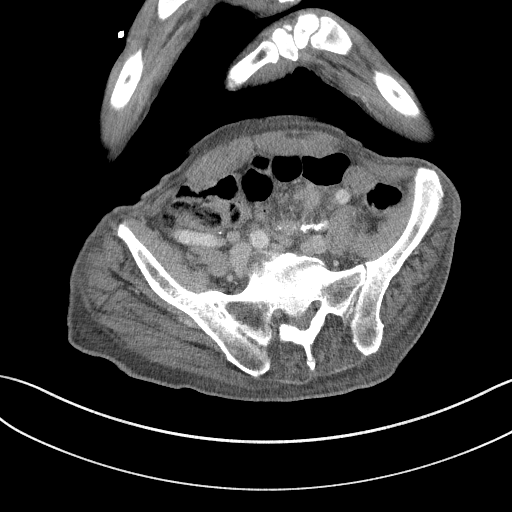
[im 49/93  soft-tissue]
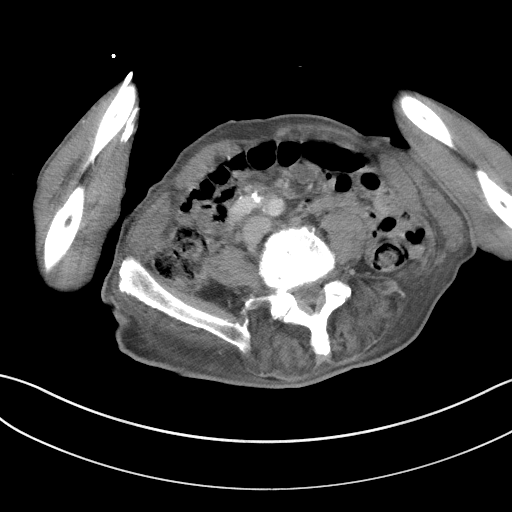
[im 54/93  soft-tissue]
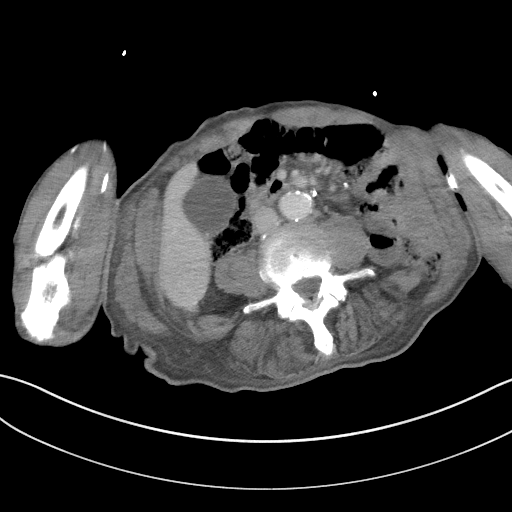
[im 59/93  soft-tissue]
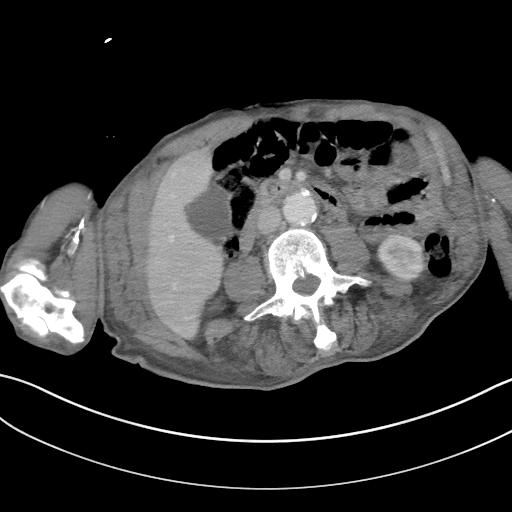
[im 59/93  bone]
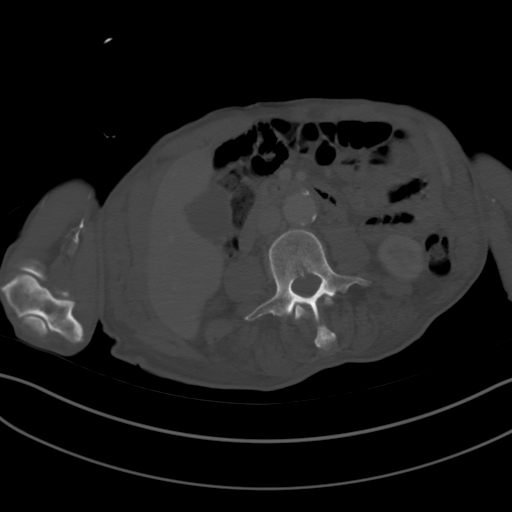
[im 68/93  soft-tissue]
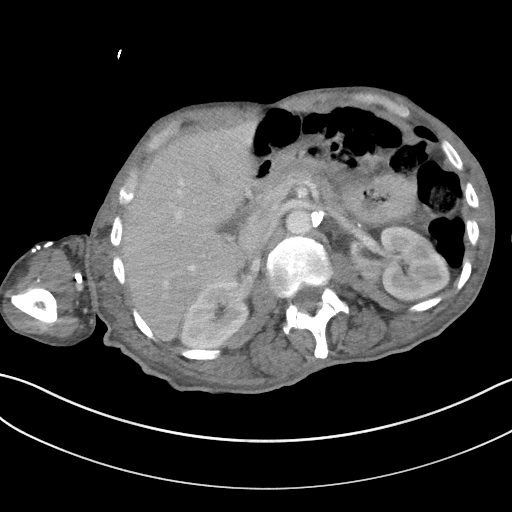
[im 73/93  soft-tissue]
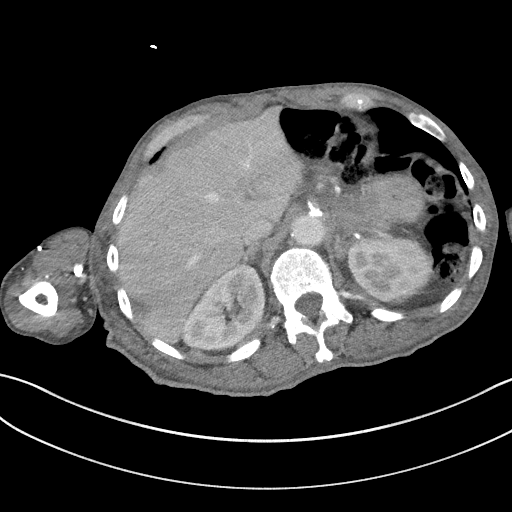
[im 78/93  soft-tissue]
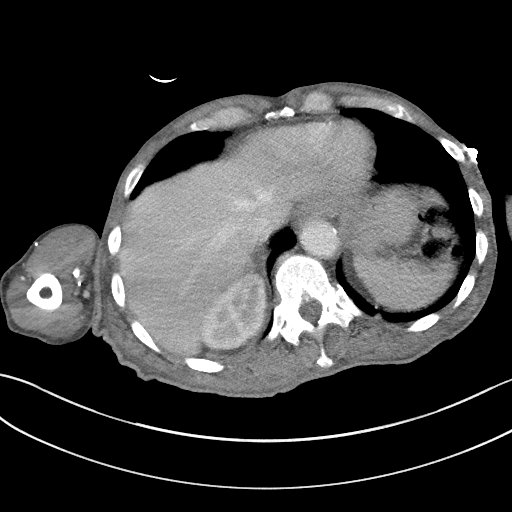
[im 88/93  soft-tissue]
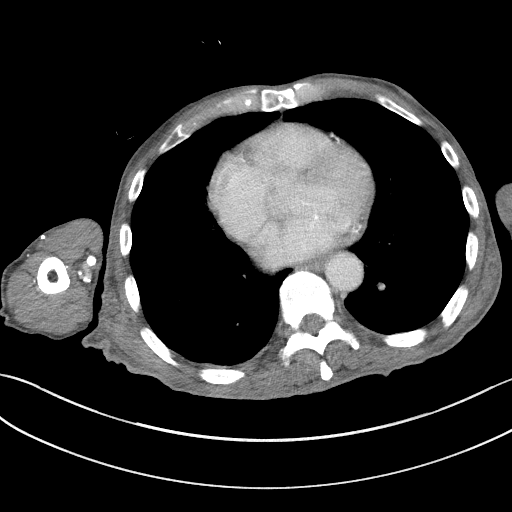

[Series 5: coronal st · coronal · 0.82mm/px · 3 of 88 slices shown]
[im 30/88  soft-tissue]
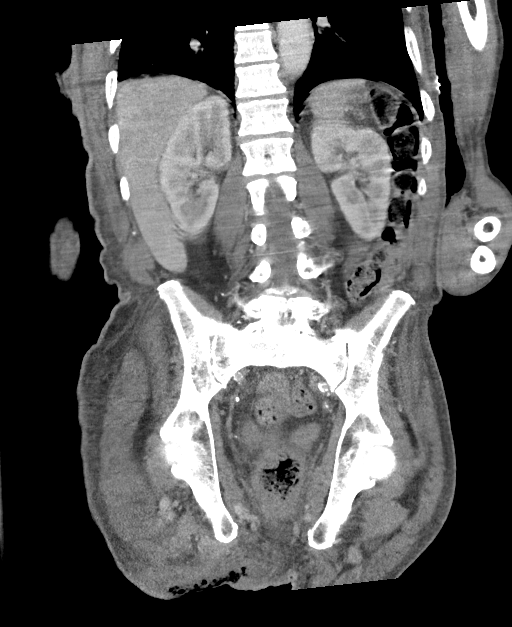
[im 39/88  soft-tissue]
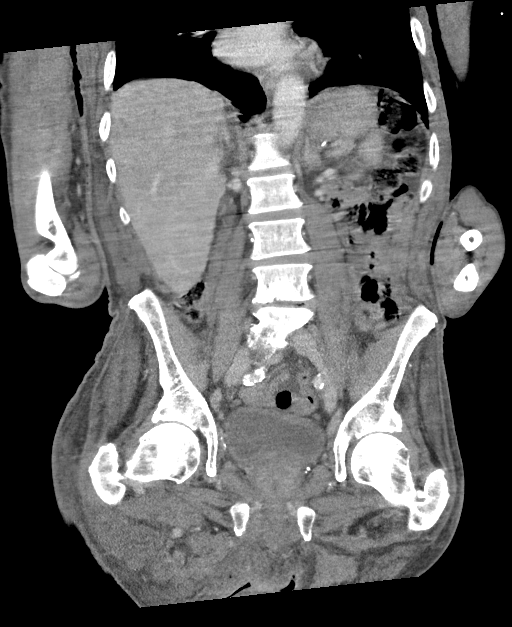
[im 49/88  soft-tissue]
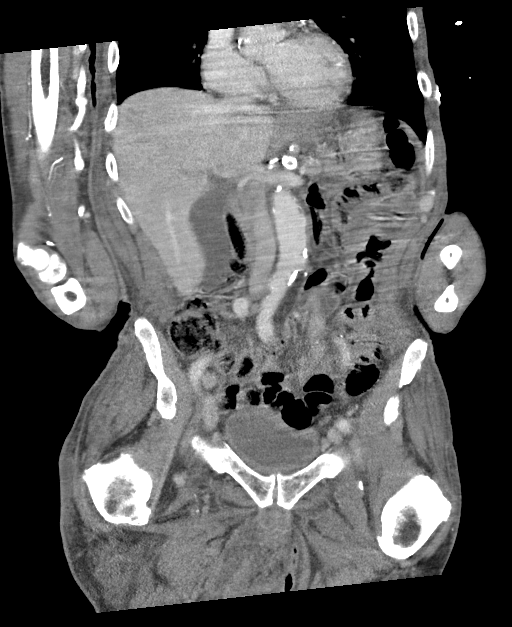

[16 of 46 positions shown; findings below may reference images not displayed]

FINDINGS: The study is mildly motion degraded.

Lower chest: Minimal atelectasis or scarring in the lung bases. No
pleural effusion. Three-vessel coronary atherosclerosis.

Hepatobiliary: No focal liver abnormality is seen. Unremarkable
gallbladder. No biliary dilatation.

Pancreas: Unremarkable.

Spleen: Unremarkable.

Adrenals/Urinary Tract: Unremarkable adrenal glands. No evidence of
renal mass or hydronephrosis. Punctate nonobstructing calculus or
vascular calcification in the right renal hilum. Unremarkable
bladder.

Stomach/Bowel: The stomach is unremarkable. There is a moderate
amount of stool in the rectum with a small amount of scattered stool
in the colon. There is no evidence of bowel obstruction. Evaluation
for bowel inflammation is limited by motion, paucity of abdominal
fat, and absence of oral contrast. Unremarkable appendix.

Vascular/Lymphatic: Abdominal aortic atherosclerosis without
aneurysm. Heavily calcified plaque at the celiac, superior
mesenteric, and bilateral renal artery origins with potentially flow
limiting stenoses. No enlarged lymph nodes.

Reproductive: Mildly enlarged prostate.

Other: No ascites or pneumoperitoneum. Right buttock soft tissue
ulceration inferior to the ischium with surrounding soft tissue
thickening and stranding extending into the included portion of the
right thigh and perineum. No fluid collection, dissecting
subcutaneous emphysema, or regional osseous destruction identified.

Musculoskeletal: Advanced disc degeneration at L4-5 and L5-S1.
IMPRESSION: 1. Inferior right buttock ulceration with surrounding inflammation
extending into the included portion of the right thigh and perineum.
No abscess. No evidence of necrotizing fasciitis or osteomyelitis.
2. Moderate rectal stool burden.
3. Aortic Atherosclerosis ([id number]).

## 2021-03-09 IMAGING — DX DG ABDOMEN 1V
1 series · 1 of 1 positions shown · non-contrast
Comparison: None.

CLINICAL DATA: In G-tube placement

EXAM:
ABDOMEN - 1 VIEW

[abdomen supine]
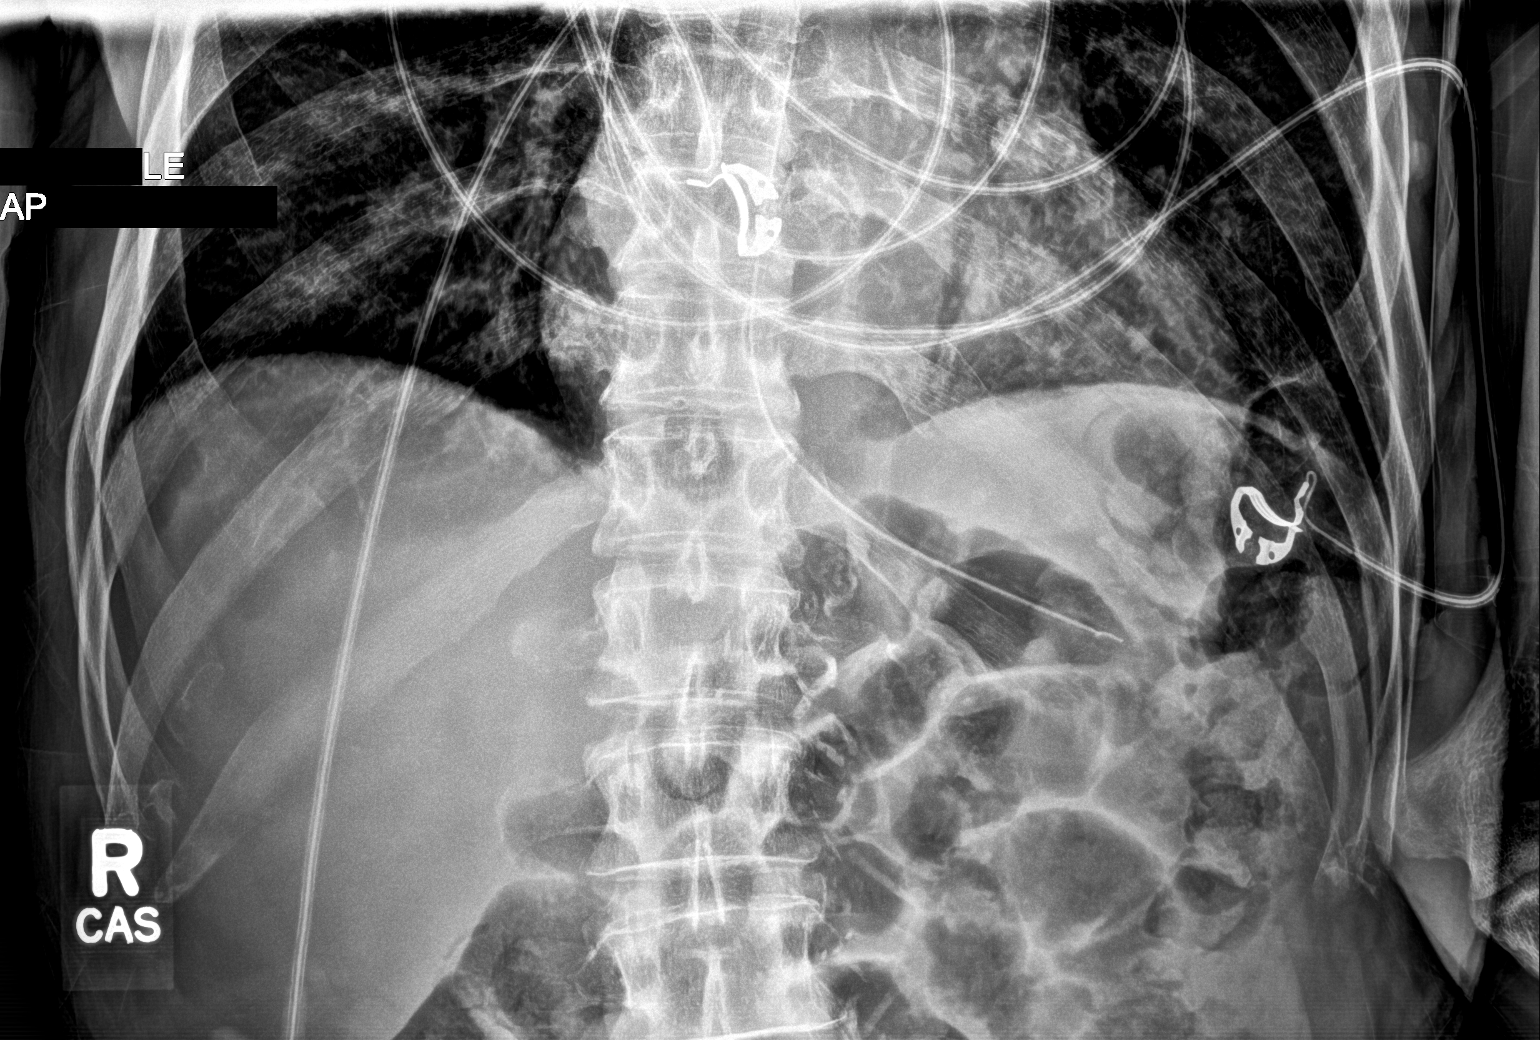

[1 of 1 positions shown; findings below may reference images not displayed]

FINDINGS: Enteric tube terminates in the proximal stomach overlying the
gastric fundus. No evidence of pneumatosis or pneumoperitoneum.
IMPRESSION: Enteric tube terminates in the proximal stomach overlying the
gastric fundus.
